# Patient Record
Sex: Female | Born: 1980 | Race: Black or African American | Hispanic: No | Marital: Single | Smoking: Never smoker
Health system: Southern US, Community
[De-identification: ages and names within clinical notes are randomized; demographics above are authoritative.]

## PROBLEM LIST (undated history)

## (undated) ENCOUNTER — Inpatient Hospital Stay (HOSPITAL_COMMUNITY): Payer: Self-pay

## (undated) DIAGNOSIS — F32A Depression, unspecified: Secondary | ICD-10-CM

## (undated) DIAGNOSIS — F329 Major depressive disorder, single episode, unspecified: Secondary | ICD-10-CM

## (undated) DIAGNOSIS — F445 Conversion disorder with seizures or convulsions: Secondary | ICD-10-CM

## (undated) DIAGNOSIS — F4310 Post-traumatic stress disorder, unspecified: Secondary | ICD-10-CM

## (undated) DIAGNOSIS — F319 Bipolar disorder, unspecified: Secondary | ICD-10-CM

## (undated) DIAGNOSIS — O139 Gestational [pregnancy-induced] hypertension without significant proteinuria, unspecified trimester: Secondary | ICD-10-CM

## (undated) DIAGNOSIS — S99921A Unspecified injury of right foot, initial encounter: Secondary | ICD-10-CM

## (undated) DIAGNOSIS — F419 Anxiety disorder, unspecified: Secondary | ICD-10-CM

## (undated) DIAGNOSIS — F99 Mental disorder, not otherwise specified: Secondary | ICD-10-CM

## (undated) DIAGNOSIS — I10 Essential (primary) hypertension: Secondary | ICD-10-CM

## (undated) DIAGNOSIS — C50919 Malignant neoplasm of unspecified site of unspecified female breast: Secondary | ICD-10-CM

## (undated) HISTORY — DX: Malignant neoplasm of unspecified site of unspecified female breast: C50.919

## (undated) HISTORY — DX: Conversion disorder with seizures or convulsions: F44.5

## (undated) HISTORY — DX: Gestational (pregnancy-induced) hypertension without significant proteinuria, unspecified trimester: O13.9

## (undated) HISTORY — DX: Post-traumatic stress disorder, unspecified: F43.10

## (undated) HISTORY — PX: FOOT SURGERY: SHX648

## (undated) MED FILL — Ferumoxytol Inj 510 MG/17ML (30 MG/ML) (Elemental Fe): INTRAVENOUS | Qty: 17 | Status: AC

---

## 2006-05-14 ENCOUNTER — Inpatient Hospital Stay (HOSPITAL_COMMUNITY): Admission: AD | Admit: 2006-05-14 | Discharge: 2006-05-21 | Payer: Self-pay | Admitting: *Deleted

## 2006-05-14 ENCOUNTER — Emergency Department (HOSPITAL_COMMUNITY): Admission: EM | Admit: 2006-05-14 | Discharge: 2006-05-14 | Payer: Self-pay | Admitting: Emergency Medicine

## 2006-05-14 ENCOUNTER — Ambulatory Visit: Payer: Self-pay | Admitting: *Deleted

## 2006-12-12 ENCOUNTER — Encounter: Admission: RE | Admit: 2006-12-12 | Discharge: 2006-12-12 | Payer: Self-pay | Admitting: Family Medicine
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
## 2012-10-09 ENCOUNTER — Encounter (HOSPITAL_COMMUNITY): Payer: Self-pay | Admitting: *Deleted

## 2012-10-09 ENCOUNTER — Inpatient Hospital Stay (HOSPITAL_COMMUNITY)
Admission: RE | Admit: 2012-10-09 | Discharge: 2012-10-14 | DRG: 885 | Disposition: A | Discharge status: Home / Self Care | Payer: BC Managed Care – PPO | Attending: Psychiatry | Admitting: Psychiatry

## 2012-10-09 DIAGNOSIS — Z79899 Other long term (current) drug therapy: Secondary | ICD-10-CM

## 2012-10-09 DIAGNOSIS — F314 Bipolar disorder, current episode depressed, severe, without psychotic features: Principal | ICD-10-CM | POA: Diagnosis present

## 2012-10-09 DIAGNOSIS — R4589 Other symptoms and signs involving emotional state: Secondary | ICD-10-CM | POA: Diagnosis present

## 2012-10-09 DIAGNOSIS — S99921A Unspecified injury of right foot, initial encounter: Secondary | ICD-10-CM

## 2012-10-09 DIAGNOSIS — F411 Generalized anxiety disorder: Secondary | ICD-10-CM | POA: Diagnosis present

## 2012-10-09 DIAGNOSIS — R45851 Suicidal ideations: Secondary | ICD-10-CM

## 2012-10-09 HISTORY — DX: Anxiety disorder, unspecified: F41.9

## 2012-10-09 HISTORY — DX: Unspecified injury of right foot, initial encounter: S99.921A

## 2012-10-09 HISTORY — DX: Mental disorder, not otherwise specified: F99

## 2012-10-09 HISTORY — PX: LAPAROSCOPIC GASTRIC SLEEVE RESECTION: SHX5895

## 2012-10-09 HISTORY — DX: Major depressive disorder, single episode, unspecified: F32.9

## 2012-10-09 HISTORY — DX: Depression, unspecified: F32.A

## 2012-10-09 MED ORDER — MAGNESIUM HYDROXIDE 400 MG/5ML PO SUSP
30.0000 mL | Freq: Every day | ORAL | Status: DC | PRN
  Administered 2012-10-10: 30 mL via ORAL

## 2012-10-09 MED ORDER — ALUM & MAG HYDROXIDE-SIMETH 200-200-20 MG/5ML PO SUSP: 30.0000 mL | ORAL | Status: DC | PRN

## 2012-10-09 MED ORDER — QUETIAPINE FUMARATE ER 400 MG PO TB24
400.0000 mg | ORAL_TABLET | Freq: Every day | ORAL | Status: DC
  Administered 2012-10-09 – 2012-10-11 (×3): 400 mg via ORAL
  Filled 2012-10-09 (×6): qty 1

## 2012-10-09 MED ORDER — LORAZEPAM 1 MG PO TABS
2.0000 mg | ORAL_TABLET | Freq: Every day | ORAL | Status: DC
  Administered 2012-10-09 – 2012-10-13 (×5): 2 mg via ORAL
  Filled 2012-10-09 (×5): qty 2

## 2012-10-09 MED ORDER — ACETAMINOPHEN 325 MG PO TABS: 650.0000 mg | ORAL_TABLET | Freq: Four times a day (QID) | ORAL | Status: DC | PRN

## 2012-10-09 MED ORDER — LAMOTRIGINE 100 MG PO TABS
200.0000 mg | ORAL_TABLET | Freq: Every day | ORAL | Status: DC
  Administered 2012-10-09 – 2012-10-13 (×5): 200 mg via ORAL
  Filled 2012-10-09 (×4): qty 1
  Filled 2012-10-09: qty 2
  Filled 2012-10-09 (×2): qty 1
  Filled 2012-10-09: qty 6

## 2012-10-09 NOTE — Progress Notes (Signed)
32 year old female pt admitted voluntarily as a walk-in. On admission, pt reports increased depression and having passive SI. Pt states a stressor being a cousin who passed recently and another cousin who is not supportive of her and also feels her medications are no longer working. Pt does see Dr. Nolen Mu on the outside. Pt spoke about having obsessive thoughts of death recently. Pt is able to contract for safety on the unit. Pt was oriented to the unit and safety maintained.

## 2012-10-09 NOTE — Progress Notes (Signed)
Writer spoke with patient and she was informed of her scheduled medications which she received. Patient spent a little time in the dayroom watching tv and interacting with select peers. Patient requested to use the phone to call her mother which she did. Patient inquired if her mother could bring in her protein powder from home and writer suggested that she speak with her doctor or P.A. On tomorrow. Patient reports feeling bad for having to be admitted and reports that she could not wait until her appointment on Wednesday because of the way she was feeling. Patient feels that her medications are not effective. Support and encouragement offered, safety maintained with 15 min checks, will continue to monitor.

## 2012-10-09 NOTE — Tx Team (Signed)
Initial Interdisciplinary Treatment Plan  PATIENT STRENGTHS: (choose at least two) Ability for insight Average or above average intelligence Capable of independent living General fund of knowledge Supportive family/friends  PATIENT STRESSORS: Loss of cousin   PROBLEM LIST: Problem List/Patient Goals Date to be addressed Date deferred Reason deferred Estimated date of resolution  Depression 10/09/12     Suicidal Ideation 10/09/12                                                DISCHARGE CRITERIA:  Ability to meet basic life and health needs Improved stabilization in mood, thinking, and/or behavior Verbal commitment to aftercare and medication compliance  PRELIMINARY DISCHARGE PLAN: Attend aftercare/continuing care group Return to previous living arrangement  PATIENT/FAMIILY INVOLVEMENT: This treatment plan has been presented to and reviewed with the patient, Casey Lang, and/or family member, .  The patient and family have been given the opportunity to ask questions and make suggestions.  Tristin Vandeusen, [place] 10/09/2012, 9:12 PM

## 2012-10-09 NOTE — Progress Notes (Signed)
Adult Psychoeducational Group Note Date: 10/09/2012  Time: 9:11 PM  Group Topic/Focus:  Wrap-Up Group: The focus of this group is to help patients review their daily goal of treatment and discuss progress on daily workbooks.  Participation Level: Active  Participation Quality: Appropriate, Sharing and Supportive  Affect: Appropriate  Cognitive: Appropriate  Insight: Appropriate  Engagement in Group: Engaged and Supportive  Modes of Intervention: Discussion, Education and Support  Additional Comments: pt attend group, pt is working on getting on the right medication , working on coping with SI thoughts and depression.  Casey Lang M  10/09/2012, 9:11 PM
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
## 2012-10-10 ENCOUNTER — Encounter (HOSPITAL_COMMUNITY): Payer: Self-pay | Admitting: Psychiatry

## 2012-10-10 LAB — URINALYSIS, ROUTINE W REFLEX MICROSCOPIC
Bilirubin Urine: NEGATIVE
Hgb urine dipstick: NEGATIVE
Ketones, ur: NEGATIVE mg/dL
Leukocytes, UA: NEGATIVE
Nitrite: NEGATIVE
Protein, ur: NEGATIVE mg/dL
Specific Gravity, Urine: 1.029 (ref 1.005–1.030)
pH: 5.5 (ref 5.0–8.0)

## 2012-10-10 LAB — COMPREHENSIVE METABOLIC PANEL
ALT: 9 U/L (ref 0–35)
Albumin: 3.6 g/dL (ref 3.5–5.2)
Alkaline Phosphatase: 49 U/L (ref 39–117)
CO2: 26 mEq/L (ref 19–32)
Calcium: 9.1 mg/dL (ref 8.4–10.5)
Creatinine, Ser: 1.02 mg/dL (ref 0.50–1.10)
GFR calc Af Amer: 83 mL/min — ABNORMAL LOW (ref >90)

## 2012-10-10 LAB — CBC
HCT: 37.3 % (ref 36.0–46.0)
MCH: 29.9 pg (ref 26.0–34.0)
RBC: 4.18 MIL/uL (ref 3.87–5.11)
RDW: 14.6 % (ref 11.5–15.5)

## 2012-10-10 LAB — ETHANOL: Alcohol, Ethyl (B): <11 mg/dL (ref 0–11)

## 2012-10-10 LAB — TSH: TSH: 4.083 u[IU]/mL (ref 0.350–4.500)

## 2012-10-10 MED ORDER — DULOXETINE HCL 20 MG PO CPEP
20.0000 mg | ORAL_CAPSULE | Freq: Every day | ORAL | Status: DC
  Administered 2012-10-10 – 2012-10-14 (×5): 20 mg via ORAL
  Filled 2012-10-10 (×6): qty 1
  Filled 2012-10-10: qty 3

## 2012-10-10 NOTE — H&P (Signed)
  Pt was seen by me today and I agree with the key elements documented in H&P.  

## 2012-10-10 NOTE — Progress Notes (Signed)
Writer observed patient sitting in the dayroom watching tv with minimal interaction with peers. Patient received her scheduled 2000 meds and reports that her day has been much better and her father visited her on today. Patient reports that her goal is to be functional with her medication so she can return to work. Patient voiced no complaints, support and encouragement offered, safety maintained on unit with 15 min checks, will continue to monitor.
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
## 2012-10-10 NOTE — Progress Notes (Signed)
Patient ID: Casey Lang, female   DOB: 02/27/1980, 32 y.o.   MRN: [id number] Psychoeducational Group Note  Date:  10/10/2012 Time:1100am  Group Topic/Focus:  Identifying Needs:   The focus of this group is to help patients identify their personal needs that have been historically problematic and identify healthy behaviors to address their needs.  Participation Level:  Active  Participation Quality:  Appropriate  Affect:  Appropriate  Cognitive:  Appropriate  Insight:  Supportive  Engagement in Group:  Supportive  Additional Comments:  Psychoeducational group -life skills   Valente David 10/10/2012,12:19 PM
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
## 2012-10-10 NOTE — [facility] Group Notes (Signed)
[facility] Group Notes:  (Clinical Social Work)  10/10/2012   3:00-4:00PM  Summary of Progress/Problems:   The main focus of today's process group was for the patient to identify ways in which they have sabotaged their own mental health wellness/recovery.  Motivational interviewing was used to explore the reasons they engage in this behavior, and reasons they may have for wanting to change.  The Stages of Change were explained to the group using a handout, and patients identified where they are with regard to changing self-defeating behaviors.  The patient expressed that she engages in negative self talk "all the time", and that she ultimately does this for self protection, hoping to say she is fat before her cousins can say it, for example.  She feels she is in Action stage, is starting on a new medication here at [facility], has plans to return to her old therapist.  She stopped going to therapy because she thought she didn't need it any longer, but she now recognizes that this may be a necessary ongoing support.  Type of Therapy:  Process Group  Participation Level:  Active  Participation Quality:  Attentive, Sharing and Supportive  Affect:  Appropriate  Cognitive:  Alert and Appropriate  Insight:  Engaged  Engagement in Therapy:  Engaged  Modes of Intervention:  Education, Motivational Interviewing   Ambrose Mantle, LCSW 10/10/2012, 4:45 PM
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
## 2012-10-10 NOTE — Progress Notes (Signed)
Adult Psychoeducational Group Note  Date:  10/10/2012 Time: 1:15pm Group Topic/Focus:  Therapeutic Activity  Participation Level:  Active  Participation Quality:  Appropriate and Attentive  Affect:  Appropriate  Cognitive:  Appropriate  Insight: Appropriate  Engagement in Group:  Engaged  Modes of Intervention:  Discussion and Education  Additional Comments:  Pt attended and participated in group  Shelly Bombard D 10/10/2012, 2:17 PM

## 2012-10-10 NOTE — Progress Notes (Signed)
D) Pt has attended the groups and interacts with her peers. Rates her depression at a 9 and her hopelessness at a 2. States that she has had thoughts of SI on and off today. Affect is flat and mood depressed.  A) Given support and reassurance along with praise. Encouraged to work on her workbook today.  R) Contracts for her safety on the unit.

## 2012-10-11 DIAGNOSIS — F411 Generalized anxiety disorder: Secondary | ICD-10-CM

## 2012-10-11 DIAGNOSIS — F3130 Bipolar disorder, current episode depressed, mild or moderate severity, unspecified: Secondary | ICD-10-CM

## 2012-10-11 NOTE — [facility] Group Notes (Signed)
Adult Psychoeducational Group Note  Date:  10/11/2012 Time:  9:50 PM  Group Topic/Focus:  Wrap-Up Group:   The focus of this group is to help patients review their daily goal of treatment and discuss progress on daily workbooks.  Participation Level:  Minimal  Participation Quality:  Appropriate  Affect:  Appropriate  Cognitive:  Appropriate  Insight: Appropriate  Engagement in Group:  Limited  Modes of Intervention:  Discussion  Additional Comments:  Casey Lang stated her goal was to socialize more and go to and eat all her meals.  She expressed that she was able to accomplish her goal.  Casey Lang 10/11/2012, 9:50 PM

## 2012-10-11 NOTE — Progress Notes (Signed)
The focus of this group is to help patients review their daily goal of treatment and discuss progress on daily workbooks. Pt attended the evening group session and responded to discussion prompts from the Writer. Pt reported having a good day on the unit largely because "I finally feel like myself again." Pt attributed this good feeling to her medications. Pt's affect was pleasant and she volunteered several encouraging comments to her peers when they spoke.

## 2012-10-11 NOTE — Progress Notes (Signed)
[facility] MD Progress Note  10/11/2012 11:25 AM Casey Lang  MRN:  [id number] Subjective:  Patient stated "I feel a whole lot better, feel like my old self.  I feel emotions again."  She is not expressing or exhibiting any signs and symptoms of hypomania or mania from the Cymbalta but likes how it is making her feel, denies depression, anxiety, and suicidal/homicidal ideations and hallucinations.  She is smiling on assessment, engages easily, goal directed thoughts.  Family visited last night and it went well. Diagnosis:   DSM5:  Depressive Disorders:  Major Depressive Disorder - Severe (296.23)  Axis I: Anxiety Disorder NOS and Bipolar, Depressed Axis II: Deferred Axis III:  Past Medical History  Diagnosis Date  . Right foot injury 10/09/2012    Resolved without complication  . Mental disorder   . Depression   . Anxiety    Axis IV: other psychosocial or environmental problems, problems related to social environment and problems with primary support group Axis V: 41-50 serious symptoms  ADL's:  Intact  Sleep: Good  Appetite:  Good  Suicidal Ideation:  Denies Homicidal Ideation:  Denies  Psychiatric Specialty Exam: Review of Systems  Constitutional: Negative.   HENT: Negative.   Eyes: Negative.   Respiratory: Negative.   Cardiovascular: Negative.   Gastrointestinal: Negative.   Genitourinary: Negative.   Musculoskeletal: Negative.   Skin: Negative.   Neurological: Negative.   Endo/Heme/Allergies: Negative.   Psychiatric/Behavioral: The patient is nervous/anxious.     Blood pressure 108/78, pulse 124, temperature 98.2 F (36.8 C), temperature source Oral, resp. rate 18, height 5\' 8"  (1.727 m), weight 110.678 kg (244 lb).Body mass index is 37.11 kg/(m^2).  General Appearance: Casual  Eye Contact::  Fair  Speech:  Normal Rate  Volume:  Normal  Mood:  Anxious  Affect:  Congruent  Thought Process:  Coherent  Orientation:  Full (Time, Place, and Person)  Thought  Content:  WDL  Suicidal Thoughts:  No  Homicidal Thoughts:  No  Memory:  Immediate;   Fair Recent;   Fair Remote;   Fair  Judgement:  Fair  Insight:  Fair  Psychomotor Activity:  Normal  Concentration:  Fair  Recall:  Fair  Akathisia:  No  Handed:  Right  AIMS (if indicated):     Assets:  Communication Skills Resilience Social Support  Sleep:  Number of Hours: 5.25   Current Medications: Current Facility-Administered Medications  Medication Dose Route Frequency Provider Last Rate Last Dose  . acetaminophen (TYLENOL) tablet 650 mg  650 mg Oral Q6H PRN Court Joy, PA-C      . alum & mag hydroxide-simeth (MAALOX/MYLANTA) 200-200-20 MG/5ML suspension 30 mL  30 mL Oral Q4H PRN Court Joy, PA-C      . DULoxetine (CYMBALTA) DR capsule 20 mg  20 mg Oral Daily Nanine Means, NP   20 mg at 10/11/12 0818  . lamoTRIgine (LAMICTAL) tablet 200 mg  200 mg Oral QHS Court Joy, PA-C   200 mg at 10/10/12 4098  . LORazepam (ATIVAN) tablet 2 mg  2 mg Oral QHS Court Joy, PA-C   2 mg at 10/10/12 1191  . magnesium hydroxide (MILK OF MAGNESIA) suspension 30 mL  30 mL Oral Daily PRN Court Joy, PA-C   30 mL at 10/10/12 1822  . QUEtiapine (SEROQUEL XR) 24 hr tablet 400 mg  400 mg Oral QHS Court Joy, PA-C   400 mg at 10/10/12 4782    Lab Results:  Results for  orders placed during the hospital encounter of 10/09/12 (from the past 48 hour(s))  CBC     Status: None   Collection Time    10/10/12  6:30 AM      Result Value Range   WBC 4.0  4.0 - 10.5 K/uL   RBC 4.18  3.87 - 5.11 MIL/uL   Hemoglobin 12.5  12.0 - 15.0 g/dL   HCT 16.1  09.6 - 04.5 %   MCV 89.2  78.0 - 100.0 fL   MCH 29.9  26.0 - 34.0 pg   MCHC 33.5  30.0 - 36.0 g/dL   RDW 40.9  81.1 - 91.4 %   Platelets 240  150 - 400 K/uL   Comment: Performed at [facility]  COMPREHENSIVE METABOLIC PANEL     Status: Abnormal   Collection Time    10/10/12  6:30 AM      Result Value Range   Sodium 136   135 - 145 mEq/L   Potassium 3.8  3.5 - 5.1 mEq/L   Chloride 103  96 - 112 mEq/L   CO2 26  19 - 32 mEq/L   Glucose, Bld 105 (*) 70 - 99 mg/dL   BUN 20  6 - 23 mg/dL   Creatinine, Ser 7.82  0.50 - 1.10 mg/dL   Calcium 9.1  8.4 - 95.6 mg/dL   Total Protein 6.9  6.0 - 8.3 g/dL   Albumin 3.6  3.5 - 5.2 g/dL   AST 12  0 - 37 U/L   ALT 9  0 - 35 U/L   Alkaline Phosphatase 49  39 - 117 U/L   Total Bilirubin 0.6  0.3 - 1.2 mg/dL   GFR calc non Af Amer 71 (*) >90 mL/min   GFR calc Af Amer 83 (*) >90 mL/min   Comment: (NOTE)     The eGFR has been calculated using the CKD EPI equation.     This calculation has not been validated in all clinical situations.     eGFR's persistently <90 mL/min signify possible Chronic Kidney     Disease.     Performed at [facility]  ETHANOL     Status: None   Collection Time    10/10/12  6:30 AM      Result Value Range   Alcohol, Ethyl (B) <11  0 - 11 mg/dL   Comment:            LOWEST DETECTABLE LIMIT FOR     SERUM ALCOHOL IS 11 mg/dL     FOR MEDICAL PURPOSES ONLY     Performed at [facility]  TSH     Status: None   Collection Time    10/10/12  6:30 AM      Result Value Range   TSH 4.083  0.350 - 4.500 uIU/mL   Comment: Performed at [facility]  URINALYSIS, ROUTINE W REFLEX MICROSCOPIC     Status: Abnormal   Collection Time    10/10/12 11:50 AM      Result Value Range   Color, Urine YELLOW  YELLOW   APPearance CLOUDY (*) CLEAR   Specific Gravity, Urine 1.029  1.005 - 1.030   pH 5.5  5.0 - 8.0   Glucose, UA NEGATIVE  NEGATIVE mg/dL   Hgb urine dipstick NEGATIVE  NEGATIVE   Bilirubin Urine NEGATIVE  NEGATIVE   Ketones, ur NEGATIVE  NEGATIVE mg/dL   Protein, ur NEGATIVE  NEGATIVE mg/dL  Urobilinogen, UA 0.2  0.0 - 1.0 mg/dL   Nitrite NEGATIVE  NEGATIVE   Leukocytes, UA NEGATIVE  NEGATIVE   Comment: MICROSCOPIC NOT DONE ON URINES WITH NEGATIVE PROTEIN, BLOOD, LEUKOCYTES, NITRITE, OR GLUCOSE <1000  mg/dL.     Performed at [facility]  URINE RAPID DRUG SCREEN (HOSP PERFORMED)     Status: None   Collection Time    10/10/12 11:50 AM      Result Value Range   Opiates NONE DETECTED  NONE DETECTED   Cocaine NONE DETECTED  NONE DETECTED   Benzodiazepines NONE DETECTED  NONE DETECTED   Amphetamines NONE DETECTED  NONE DETECTED   Tetrahydrocannabinol NONE DETECTED  NONE DETECTED   Barbiturates NONE DETECTED  NONE DETECTED   Comment:            DRUG SCREEN FOR MEDICAL PURPOSES     ONLY.  IF CONFIRMATION IS NEEDED     FOR ANY PURPOSE, NOTIFY LAB     WITHIN 5 DAYS.                LOWEST DETECTABLE LIMITS     FOR URINE DRUG SCREEN     Drug Class       Cutoff (ng/mL)     Amphetamine      1000     Barbiturate      200     Benzodiazepine   200     Tricyclics       300     Opiates          300     Cocaine          300     THC              50     Performed at [facility]    Physical Findings: AIMS: Facial and Oral Movements Muscles of Facial Expression: None, normal Lips and Perioral Area: None, normal Jaw: None, normal Tongue: None, normal,Extremity Movements Upper (arms, wrists, hands, fingers): None, normal Lower (legs, knees, ankles, toes): None, normal, Trunk Movements Neck, shoulders, hips: None, normal, Overall Severity Severity of abnormal movements (highest score from questions above): None, normal Incapacitation due to abnormal movements: None, normal Patient's awareness of abnormal movements (rate only patient's report): No Awareness, Dental Status Current problems with teeth and/or dentures?: No Does patient usually wear dentures?: No  CIWA:    COWS:     Treatment Plan Summary: Daily contact with patient to assess and evaluate symptoms and progress in treatment Medication management  Plan:  Review of chart, vital signs, medications, and notes. 1-Individual and group therapy 2-Medication management for depression and anxiety:   Medications reviewed with the patient and no untoward effects, no changes made 3-Coping skills for depression and anxiety 4-Continue crisis stabilization and management 5-Address health issues--monitoring vital signs, stable 6-Treatment plan in progress to prevent relapse of depression and anxiety  Medical Decision Making Problem Points:  Established problem, stable/improving (1) and Review of psycho-social stressors (1) Data Points:  Review of medication regiment & side effects (2)  I certify that inpatient services furnished can reasonably be expected to improve the patient's condition.   Nanine Means, PMH-NP 10/11/2012, 11:25 AM

## 2012-10-11 NOTE — Progress Notes (Signed)
Patient ID: Casey Lang, female   DOB: 1980-08-03, 32 y.o.   MRN: [id number] Psychoeducational Group Note  Date:  10/11/2012 Time:  1045am  Group Topic/Focus:  Making Healthy Choices:   The focus of this group is to help patients identify negative/unhealthy choices they were using prior to admission and identify positive/healthier coping strategies to replace them upon discharge.  Participation Level:  Active  Participation Quality:  Appropriate  Affect:  Appropriate  Cognitive:  Appropriate  Insight:  Supportive  Engagement in Group:  Supportive  Additional Comments:  Psychoeducational group-life skills   Valente David 10/11/2012,1:27 PM

## 2012-10-11 NOTE — Progress Notes (Signed)
Patient ID: Casey Lang, female   DOB: September 29, 1980, 32 y.o.   MRN: [id number] Psychoeducational Group Note  Date:  10/11/2012 Time:  0930am  Group Topic/Focus:  Making Healthy Choices:   The focus of this group is to help patients identify negative/unhealthy choices they were using prior to admission and identify positive/healthier coping strategies to replace them upon discharge.  Participation Level:  Active  Participation Quality:  Appropriate  Affect:  Appropriate  Cognitive:  Appropriate  Insight:  Resistant  Engagement in Group:  Resistant  Additional Comments:  Psychoeducational group-greatfulness  Valente David 10/11/2012,10:19 AM

## 2012-10-11 NOTE — [facility] Group Notes (Signed)
[facility] Group Notes:  (Clinical Social Work)  10/11/2012   3:00-4:00PM  Summary of Progress/Problems:   The main focus of today's process group was to   identify the patient's current support system and decide on other supports that can be put in place.  The picture on workbook was used to discuss why additional supports are needed, and a hand-out was distributed with four definitions/levels of support, then used to talk about how patients have given and received all different kinds of support.  An emphasis was placed on using counselor, doctor, therapy groups, 12-step groups, and problem-specific support groups to expand supports.  The patient identified one additional support as being a kayaking club she is familiar with and has been to before.  She knows that she tends to isolate to her home, and states she needs to do more things that keep her with people and outside of the home.  Type of Therapy:  Process Group  Participation Level:  Active  Participation Quality:  Attentive, Sharing and Supportive  Affect:  Appropriate  Cognitive:  Appropriate and Oriented  Insight:  Engaged  Engagement in Therapy:  Engaged  Modes of Intervention:  Education,  Support and [employer], LCSW 10/11/2012, 4:54 PM

## 2012-10-11 NOTE — Progress Notes (Signed)
D) Pt states that she is feeling much better and feels as though the Cymbalta is working well. Talked with Pt about making a pact with her parents to help her identify when she is starting to show symptoms of mania or depression. States that she feels that she can do that and that her family is supportive and will help her with this. Attending the groups, partisipating and interacting with her peers appropriately. Shared with this writer that she has probl;ems being in large crowds and does not want to go to the cafeteria because "it is too uncomfortable" A) Given support and reassurance along with praise. Encouraged to talk with her parents and to write out a plan. Provided with a 1:1 and encouraged to go to the cafeteria, even though it feels uncomfortable. R) Pt denies SI and HI. Rates her depression and hopelessness both at a 1. Was able to go to the cafeteria and eat her meal.

## 2012-10-11 NOTE — Progress Notes (Signed)
Writer spoke with patient in her room and observed her lying in bed staring at the ceiling. Writer inquired if she felt ok and she reports that she feels a bit manic. Writer asked if patient was interested in doing word searches and she was agreeable. Patient has been up and in the dayroom earlier and attended group this evening. Patient requested that her medications be given at 2100 instead of 2000 b/c she did not rest all through the night. Support and encouragement offered, safety maintained on unit with 15 min checks. Patient denies si/hi/a/v hallucinations. Will continue to monitor.

## 2012-10-12 MED ORDER — QUETIAPINE FUMARATE ER 400 MG PO TB24
400.0000 mg | ORAL_TABLET | Freq: Every day | ORAL | Status: DC
  Administered 2012-10-12 – 2012-10-13 (×2): 400 mg via ORAL
  Filled 2012-10-12: qty 1
  Filled 2012-10-12: qty 3
  Filled 2012-10-12 (×2): qty 1

## 2012-10-12 NOTE — Progress Notes (Addendum)
Patient would like her protein shake which she has in the 500 med room.  MD/PA informed.  D:  Patient's self inventory sheet, has poor sleep, poor appetite, low energy level, poor attention span.  Rated depression and hopelessness #1.  Denied withdrawals.  Denied SI.  Has felt lethargic.  "Continue outpatient treatment with psychiatrist and therapist.  Join/attend support groups r/t mental health, socializing activities.  Did not sleep well.  Will wake up around 1 a.m., drift on/off, and then wide awake until 5 a.m.  At 6 a.m. Very exhausted, then do morning routine, still groggy.  Maybe seroquel XR can be increased so I can sleep better.  Also feeling manic with cymbalta and having visual hallucinations. But it works wonders for my depression."   No problems taking meds after discharge. A:  Medications administered per MD orders.  Emotional support and encouragement given patient throughout day. R:  Denied SI and HI.   Denied voices.  Has been seeing eyes on the ceiling of her room and in her breakfast tray this morning.  Denied pain.  Stated she needs her medications adjusted.  1800  Patient stated she has hallucinations previously on seroquel XR 800 mg, approximately 16 months ago.  "It took awhile for them to go away.  But do not want this to keep me from being discharged."  Has follow up appointment with Dr. Emerson Monte on [address], [place], [place] on Wednesday 10/14/2012.  Has appointment with her every 3 months.   Dr. Nolen Mu is aware of patient's symptoms.  Areta Haber, clinical nurse specialist  in psychiatry,  is aware that patient is at [facility].

## 2012-10-12 NOTE — Progress Notes (Signed)
Patient ID: Casey Lang, female   DOB: [birth year], 32 y.o.   MRN: [id number] [facility] MD Progress Note  10/12/2012 2:04 PM Casey Lang  MRN:  [id number]  Subjective:  Patient stated that she needs a physician order for taking her protein shake from home. She has requested to change the seroquel to different hour in the evening to prevent having day time drowsiness. She has been adjusting to her current medications without significant side effects. She wants to watch out for manic episode on her antidepressant medication Cymbalta. She stated that she gets obsessive thoughts event though she does not have OCD as diagnosis.   Diagnosis:   DSM5:  Depressive Disorders:  Major Depressive Disorder - Severe (296.23), Bipolar disorder  Axis I: Anxiety Disorder NOS and Bipolar, Depressed Axis II: Deferred Axis III:  Past Medical History  Diagnosis Date  . Right foot injury 10/09/2012    Resolved without complication  . Mental disorder   . Depression   . Anxiety    Axis IV: other psychosocial or environmental problems, problems related to social environment and problems with primary support group Axis V: 41-50 serious symptoms  ADL's:  Intact  Sleep: Good  Appetite:  Good  Suicidal Ideation:  Denies Homicidal Ideation:  Denies  Psychiatric Specialty Exam: Review of Systems  Constitutional: Negative.   HENT: Negative.   Eyes: Negative.   Respiratory: Negative.   Cardiovascular: Negative.   Gastrointestinal: Negative.   Genitourinary: Negative.   Musculoskeletal: Negative.   Skin: Negative.   Neurological: Negative.   Endo/Heme/Allergies: Negative.   Psychiatric/Behavioral: The patient is nervous/anxious.     Blood pressure 129/94, pulse 84, temperature 98.1 F (36.7 C), temperature source Oral, resp. rate 18, height 5\' 8"  (1.727 m), weight 110.678 kg (244 lb).Body mass index is 37.11 kg/(m^2).  General Appearance: Casual  Eye Contact::  Fair  Speech:  Normal Rate   Volume:  Normal  Mood:  Anxious  Affect:  Congruent  Thought Process:  Coherent  Orientation:  Full (Time, Place, and Person)  Thought Content:  WDL  Suicidal Thoughts:  No  Homicidal Thoughts:  No  Memory:  Immediate;   Fair Recent;   Fair Remote;   Fair  Judgement:  Fair  Insight:  Fair  Psychomotor Activity:  Normal  Concentration:  Fair  Recall:  Fair  Akathisia:  No  Handed:  Right  AIMS (if indicated):     Assets:  Communication Skills Resilience Social Support  Sleep:  Number of Hours: 6   Current Medications: Current Facility-Administered Medications  Medication Dose Route Frequency Provider Last Rate Last Dose  . acetaminophen (TYLENOL) tablet 650 mg  650 mg Oral Q6H PRN Court Joy, PA-C      . alum & mag hydroxide-simeth (MAALOX/MYLANTA) 200-200-20 MG/5ML suspension 30 mL  30 mL Oral Q4H PRN Court Joy, PA-C      . DULoxetine (CYMBALTA) DR capsule 20 mg  20 mg Oral Daily Nanine Means, NP   20 mg at 10/12/12 0756  . lamoTRIgine (LAMICTAL) tablet 200 mg  200 mg Oral QHS Court Joy, PA-C   200 mg at 10/11/12 2110  . LORazepam (ATIVAN) tablet 2 mg  2 mg Oral QHS Court Joy, PA-C   2 mg at 10/11/12 2109  . magnesium hydroxide (MILK OF MAGNESIA) suspension 30 mL  30 mL Oral Daily PRN Court Joy, PA-C   30 mL at 10/10/12 1822  . QUEtiapine (SEROQUEL XR) 24 hr tablet 400 mg  400 mg Oral QHS Court Joy, PA-C   400 mg at 10/11/12 2109    Lab Results:  No results found for this or any previous visit (from the past 48 hour(s)).  Physical Findings: AIMS: Facial and Oral Movements Muscles of Facial Expression: None, normal Lips and Perioral Area: None, normal Jaw: None, normal Tongue: None, normal,Extremity Movements Upper (arms, wrists, hands, fingers): None, normal Lower (legs, knees, ankles, toes): None, normal, Trunk Movements Neck, shoulders, hips: None, normal, Overall Severity Severity of abnormal movements (highest score from  questions above): None, normal Incapacitation due to abnormal movements: None, normal Patient's awareness of abnormal movements (rate only patient's report): No Awareness, Dental Status Current problems with teeth and/or dentures?: No Does patient usually wear dentures?: No  CIWA:  CIWA-Ar Total: 3 COWS:  COWS Total Score: 3  Treatment Plan Summary: Daily contact with patient to assess and evaluate symptoms and progress in treatment Medication management  Plan:  Review of chart, vital signs, medications, and notes. 1-Individual and group therapy 2-Medication management for depression and anxiety:  Medications reviewed with the patient and no untoward effects, change the time of seroquel for better control of drowsiness. May take protein diet from home supply. 3-Coping skills for depression and anxiety 4-Continue crisis stabilization and management 5-Address health issues--monitoring vital signs, stable 6-Treatment plan in progress to prevent relapse of depression and anxiety  Medical Decision Making Problem Points:  Established problem, stable/improving (1) and Review of psycho-social stressors (1) Data Points:  Review of medication regiment & side effects (2)  I certify that inpatient services furnished can reasonably be expected to improve the patient's condition.   Nehemiah Settle., MD  10/12/2012, 2:04 PM

## 2012-10-12 NOTE — Progress Notes (Signed)
Patient ID: Casey Lang, female   DOB: 06/25/1980, 32 y.o.   MRN: [id number] D: pt. Reports she was admitted because she was "depressed andhaving obsessive thoughts of death, but the Cymbalta is helping and the Seroquel" "groups been good, I learned a lot about my illness, more than ever." A: Writer introduced self to client and discussed medication and administration times. Writer encouraged group. Staff will monitor q17min for safety. R: Pt. Is safe on the unit and attends group.

## 2012-10-12 NOTE — Progress Notes (Signed)
Adult Psychoeducational Group Note  Date:  10/12/2012 Time:  10:00am Group Topic/Focus:  Self Care:   The focus of this group is to help patients understand the importance of self-care in order to improve or restore emotional, physical, spiritual, interpersonal, and financial health.  Participation Level:  Active  Participation Quality:  Appropriate and Attentive  Affect:  Appropriate  Cognitive:  Alert and Appropriate  Insight: Appropriate  Engagement in Group:  Engaged  Modes of Intervention:  Discussion and Education  Additional Comments:  Pt attended and participated in group.When asked what self care means to her pt stated taking care of yourself,eating right, exercising, getting plenty of rest.  Shelly Bombard D 10/12/2012, 1:03 PM

## 2012-10-13 NOTE — Progress Notes (Signed)
Patient ID: Casey Lang, female   DOB: 1980/04/13, 32 y.o.   MRN: [id number] [facility] MD Progress Note  10/13/2012 1:50 PM Casey Lang  MRN:  [id number]  Subjective:  Patient is seen today and she stated that she feels much better today because she took her Seroquel at 6 PM last night. She also taking her protein shake from home. She has less day time drowsiness. She has minimizes symptoms of depression, anxiety and mania and stated sleeping and appetite has improved. She has been actively participating on the unit activities and coping with adjusting current medications without significant side effects and learning coping skills during counseling sessions. She has no apparent manic episode on Cymbalta. She stated that she gets obsessive thoughts from time to time but does not provide details at this time.  Diagnosis:   DSM5:  Depressive Disorders:  Major Depressive Disorder - Severe (296.23), Bipolar disorder  Axis I: Anxiety Disorder NOS and Bipolar, Depressed Axis II: Deferred Axis III:  Past Medical History  Diagnosis Date  . Right foot injury 10/09/2012    Resolved without complication  . Mental disorder   . Depression   . Anxiety    Axis IV: other psychosocial or environmental problems, problems related to social environment and problems with primary support group Axis V: 41-50 serious symptoms  ADL's:  Intact  Sleep: Good  Appetite:  Good  Suicidal Ideation:  Denies Homicidal Ideation:  Denies  Psychiatric Specialty Exam: Review of Systems  Constitutional: Negative.   HENT: Negative.   Eyes: Negative.   Respiratory: Negative.   Cardiovascular: Negative.   Gastrointestinal: Negative.   Genitourinary: Negative.   Musculoskeletal: Negative.   Skin: Negative.   Neurological: Negative.   Endo/Heme/Allergies: Negative.   Psychiatric/Behavioral: The patient is nervous/anxious.     Blood pressure 116/76, pulse 74, temperature 98.1 F (36.7 C), temperature  source Oral, resp. rate 20, height 5\' 8"  (1.727 m), weight 110.678 kg (244 lb).Body mass index is 37.11 kg/(m^2).  General Appearance: Casual  Eye Contact::  Fair  Speech:  Normal Rate  Volume:  Normal  Mood:  Anxious  Affect:  Congruent  Thought Process:  Coherent  Orientation:  Full (Time, Place, and Person)  Thought Content:  WDL  Suicidal Thoughts:  No  Homicidal Thoughts:  No  Memory:  Immediate;   Fair Recent;   Fair Remote;   Fair  Judgement:  Fair  Insight:  Fair  Psychomotor Activity:  Normal  Concentration:  Fair  Recall:  Fair  Akathisia:  No  Handed:  Right  AIMS (if indicated):     Assets:  Communication Skills Resilience Social Support  Sleep:  Number of Hours: 6   Current Medications: Current Facility-Administered Medications  Medication Dose Route Frequency Provider Last Rate Last Dose  . acetaminophen (TYLENOL) tablet 650 mg  650 mg Oral Q6H PRN Court Joy, PA-C      . alum & mag hydroxide-simeth (MAALOX/MYLANTA) 200-200-20 MG/5ML suspension 30 mL  30 mL Oral Q4H PRN Court Joy, PA-C      . DULoxetine (CYMBALTA) DR capsule 20 mg  20 mg Oral Daily Nanine Means, NP   20 mg at 10/13/12 0748  . lamoTRIgine (LAMICTAL) tablet 200 mg  200 mg Oral QHS Court Joy, PA-C   200 mg at 10/12/12 2004  . LORazepam (ATIVAN) tablet 2 mg  2 mg Oral QHS Court Joy, PA-C   2 mg at 10/12/12 2005  . magnesium hydroxide (MILK OF MAGNESIA) suspension  30 mL  30 mL Oral Daily PRN Court Joy, PA-C   30 mL at 10/10/12 1822  . QUEtiapine (SEROQUEL XR) 24 hr tablet 400 mg  400 mg Oral QHS Nehemiah Settle, MD   400 mg at 10/12/12 1810    Lab Results:  No results found for this or any previous visit (from the past 48 hour(s)).  Physical Findings: AIMS: Facial and Oral Movements Muscles of Facial Expression: None, normal Lips and Perioral Area: None, normal Jaw: None, normal Tongue: None, normal,Extremity Movements Upper (arms, wrists, hands,  fingers): None, normal Lower (legs, knees, ankles, toes): None, normal, Trunk Movements Neck, shoulders, hips: None, normal, Overall Severity Severity of abnormal movements (highest score from questions above): None, normal Incapacitation due to abnormal movements: None, normal Patient's awareness of abnormal movements (rate only patient's report): No Awareness, Dental Status Current problems with teeth and/or dentures?: No Does patient usually wear dentures?: No  CIWA:  CIWA-Ar Total: 1 COWS:  COWS Total Score: 1  Treatment Plan Summary: Daily contact with patient to assess and evaluate symptoms and progress in treatment Medication management  Plan:   Review of chart, vital signs, medications, and notes. 1-Individual and group therapy 2-Medication management for depression and anxiety:  Medications reviewed with the patient and no untoward effects, continue Seroquel, Lamictal for mood swings and Cymbalta for better mood control. May take protein diet from home supply. 3-Coping skills for depression and anxiety 4-Continue crisis stabilization and management 5-Address health issues--monitoring vital signs, stable 6-Treatment plan in progress, May discharge tomorrow AM.  Medical Decision Making Problem Points:  Established problem, stable/improving (1) and Review of psycho-social stressors (1) Data Points:  Review of medication regiment & side effects (2)  I certify that inpatient services furnished can reasonably be expected to improve the patient's condition.   Nehemiah Settle., MD  10/13/2012, 1:50 PM

## 2012-10-13 NOTE — Progress Notes (Signed)
Writer spoke with patient at medication window and she reports that she feels much better since taking her seroquel earlier. Patient is looking forward to discharge on tomorrow and plans to she her psychiatrist on tomorrow also. Patient voiced no complaints, denies si/hi/a/v hallucinations. Support and encouragement offered, safety maintained on unit, will conitnue to monitor.

## 2012-10-13 NOTE — Progress Notes (Signed)
Adult Psychoeducational Group Note  Date:  10/13/2012 Time:  10:40 PM  Group Topic/Focus:  Healthy Communication:   The focus of this group is to discuss communication, barriers to communication, as well as healthy ways to communicate with others.  Participation Level:  Active  Participation Quality:  Appropriate, Attentive, Sharing and Supportive  Affect:  Appropriate  Cognitive:  Alert, Appropriate and Oriented  Insight: Appropriate  Engagement in Group:  Engaged and Supportive  Modes of Intervention:  Problem-solving  Additional Comments:  Audre shared with the group how she activate her faith for coping tools   Annell Greening [place] 10/13/2012, 10:40 PM

## 2012-10-13 NOTE — Progress Notes (Signed)
D:  Patient's sleeps well, good appetite, normal energy level, good attention span.  Denied depression, hopelessness and anxiety.  Denied withdrawals.  After discharge, plans to continue to see psychiatrist along with psychotherapy treatment already established, participate in community activities and attend support groups.  Thank you again for your wonderful care.  Would like to talk to MD, wants to go to follow up appointment Wednesday with Dr. Nolen Mu if possible.      A:  Medications administered per MD orders.  Emotional support and encouragement given patient. R:  Denied SI and HI.  Denied A/V hallucinations.  Denied pain.  Will continue checks every 15 minutes for safety checks.  Checks maintained.

## 2012-10-13 NOTE — [facility] Group Notes (Signed)
[facility] LCSW Group Therapy      Feelings About Diagnosis 1:15 - 2:30 PM         10/13/2012    Type of Therapy:  Group Therapy  Participation Level:  Active  Participation Quality:  Appropriate  Affect:  Appropriate  Cognitive:  Alert and Appropriate  Insight:  Developing/Improving and Engaged  Engagement in Therapy:  Developing/Improving and Engaged  Modes of Intervention:  Discussion, Education, Exploration, Problem-Solving, Rapport Building, Support  Summary of Progress/Problems:  Patient actively participated in group. Patient discussed past and present diagnosis and the effects it has had on  life.  Patient talked about family and society being judgmental and the stigma associated with having a mental health diagnosis.  Patient shared she accepts her diagnosis and hopes to spend more time educating family and co-workers on Bipolar.  Wynn Banker 10/13/2012

## 2012-10-13 NOTE — Progress Notes (Signed)
Pt. attended spirituality group focusing on topic of grief and loss facilitated by chaplain.  Group facilitated awareness around loss in pt's lives. Members normalized and explored loss in relationships with self and others, explored how history and family expectations shape our expectations of we walk with grief.  As group members responded to one another, facilitator assisted group in drawing out themes in their grief work.    Casey Lang was attentive during group.  She did not contribute to discussion.   Belva Crome MDiv

## 2012-10-13 NOTE — [facility] Suicide Risk Assessment (Signed)
[facility] INPATIENT:  Family/Significant Other Suicide Prevention Education  Suicide Prevention Education:  Education Completed; Casey Lang, Mother, [phone];  has been identified by the patient as the family member/significant other with whom the patient will be residing, and identified as the person(s) who will aid the patient in the event of a mental health crisis (suicidal ideations/suicide attempt).  With written consent from the patient, the family member/significant other has been provided the following suicide prevention education, prior to the and/or following the discharge of the patient.  The suicide prevention education provided includes the following:  Suicide risk factors  Suicide prevention and interventions  National Suicide Hotline telephone number  [facility] assessment telephone number  [facility] Emergency Assistance 911  [facility] and/or Residential Mobile Crisis Unit telephone number  Request made of family/significant other to:  Remove weapons (e.g., guns, rifles, knives), all items previously/currently identified as safety concern.  Mother advised patient does not have access to weapons.    Remove drugs/medications (over-the-counter, prescriptions, illicit drugs), all items previously/currently identified as a safety concern.  The family member/significant other verbalizes understanding of the suicide prevention education information provided.  The family member/significant other agrees to remove the items of safety concern listed above.  Wynn Banker 10/13/2012, 3:02 PM

## 2012-10-13 NOTE — Progress Notes (Signed)
The focus of this group is to educate the patient on the purpose and policies of crisis stabilization and provide a format to answer questions about their admission.  The group details unit policies and expectations of patients while admitted.  Patient attended 0900 nurse education orientation group.  Patient actively participated, appropriate affect, alert, appropriate insight and engagement.  Patient will work on goals for discharge today.  

## 2012-10-13 NOTE — Progress Notes (Signed)
Adult Psychoeducational Group Note  Date:  10/13/2012 Time: 11:00am Group Topic/Focus:  Recovery Goals:   The focus of this group is to identify appropriate goals for recovery and establish a plan to achieve them.  Participation Level:  Active  Participation Quality:  Appropriate and Attentive  Affect:  Appropriate  Cognitive:  Alert and Appropriate  Insight: Appropriate  Engagement in Group:  Engaged  Modes of Intervention:  Discussion and Education  Additional Comments:  Pt attended and participated in group. When asked what recovery means to her pt stated its healing,and using coping strategies you learn.  Shelly Bombard D 10/13/2012, 2:13 PM

## 2012-10-14 MED ORDER — LAMOTRIGINE 200 MG PO TABS: 200.0000 mg | ORAL_TABLET | Freq: Every day | ORAL | Status: DC

## 2012-10-14 MED ORDER — DULOXETINE HCL 20 MG PO CPEP: 20.0000 mg | ORAL_CAPSULE | Freq: Every day | ORAL | Status: DC

## 2012-10-14 MED ORDER — LORAZEPAM 2 MG PO TABS: 2.0000 mg | ORAL_TABLET | Freq: Every day | ORAL | Status: DC

## 2012-10-14 MED ORDER — FERROUS SULFATE 325 (65 FE) MG PO TABS: 325.0000 mg | ORAL_TABLET | Freq: Every day | ORAL | Status: DC

## 2012-10-14 MED ORDER — QUETIAPINE FUMARATE ER 400 MG PO TB24: 400.0000 mg | ORAL_TABLET | Freq: Every day | ORAL | Status: DC

## 2012-10-14 NOTE — Tx Team (Signed)
Interdisciplinary Treatment Plan Update   Date Reviewed:  10/14/2012  Time Reviewed:  9:44 AM  Progress in Treatment:   Attending groups: Yes Participating in groups: Yes Taking medication as prescribed: Yes  Tolerating medication: Yes Family/Significant other contact made: Yes, contact made with mother,  Patient understands diagnosis: Yes  Discussing patient identified problems/goals with staff: Yes Medical problems stabilized or resolved: Yes Denies suicidal/homicidal ideation: Yes Patient has not harmed self or others: Yes  For review of initial/current patient goals, please see plan of care.  Estimated Length of Stay:  Discharge today  Reasons for Continued Hospitalization:   New Problems/Goals identified:    Discharge Plan or Barriers:   Home with outpatient follow up with Dr. Nolen Mu and Areta Haber  Additional Comments: N/A  Attendees:  Patient:  10/14/2012 9:44 AM   Signature: Mervyn Gay, MD 10/14/2012 9:44 AM  Signature:  Verne Spurr, PA 10/14/2012 9:44 AM  Signature:  Leighton Parody, RN 10/14/2012 9:44 AM  Signature:Beverly Terrilee Croak, RN 10/14/2012 9:44 AM  Signature:  Neill Loft RN 10/14/2012 9:44 AM  Signature:  Juline Patch, LCSW 10/14/2012 9:44 AM  Signature:  Reyes Ivan, LCSW 10/14/2012 9:44 AM  Signature:  Maseta Dorley,Care Coordinator 10/14/2012 9:44 AM  Signature: 10/14/2012 9:44 AM  Signature:    Signature:    Signature:      Scribe for Treatment Team:   Juline Patch,  10/14/2012 9:44 AM

## 2012-10-14 NOTE — Progress Notes (Signed)
Casey Lang was discharged home with family.  Follow up information, prescriptions, sample medications and discharge instructions given to patient who verbalized understanding.  Patient reports feeling much better and is happy to be going home.  No SI/HI/AVH at this time.

## 2012-10-14 NOTE — Progress Notes (Signed)
[facility] Adult Case Management Discharge Plan :  Will you be returning to the same living situation after discharge: Yes,  Patient will return to her home. At discharge, do you have transportation home?:Yes,  Patient will arrange transportation home. Do you have the ability to pay for your medications:Yes,  Patient is able to afford medications.  Release of information consent forms completed and in the chart;  Patient's signature needed at discharge.  Patient to Follow up at: Follow-up Information   Follow up with Dr. Emerson Monte On 10/14/2012. (Dr. Nolen Mu 3 PM 10/14/12)    Contact information:   [address] [place], [place]   [address]  [phone]      Follow up with Areta Haber, PC[facility] On 10/15/2012. Areta Haber on Thursday, October 15, 2012 at 11:00AM)    Contact information:   [address]  [place], [place]  [address]   [phone]      Patient denies SI/HI:  Patient no longer endorsing SI/HI or other thoughts of self harm.   Safety Planning and Suicide Prevention discussed:  .Reviewed with all patients during discharge planning group   Casey Lang, Casey Lang July 10/14/2012, 10:16 AM

## 2012-10-14 NOTE — Discharge Summary (Signed)
Physician Discharge Summary Note  Patient:  Casey Lang is an 32 y.o., female MRN:  [id number] DOB:  Jun 26, 1980 Patient phone:  [phone] (home)  Patient address:   [address] [place] [address],   Date of Admission:  10/09/2012 Date of Discharge: 10/14/2012   Reason for Admission:  Suicidal ideation  Discharge Diagnoses: Principal Problem:   Suicidal risk Active Problems:   Bipolar I disorder, most recent episode (or current) depressed, severe, without mention of psychotic behavior  ROS  DSM5: Depressive Disorders: Major Depressive Disorder - Severe (296.23)  AXIS I: Anxiety Disorder NOS and Bipolar, Depressed  AXIS II: Deferred  AXIS III:  Past Medical History   Diagnosis  Date   .  Right foot injury  10/09/2012     Resolved without complication   .  Mental disorder    .  Depression    .  Anxiety     AXIS IV: occupational problems, other psychosocial or environmental problems, problems related to social environment and problems with primary support group  AXIS V: 41-50 serious symptoms  Level of Care:  OP  Lang Course:  Casey Lang was admitted to [facility] after she reported increasing depression with suicidal ideation to a co-worker. Her mother brought her Casey Lang for stabilization and crisis management.      Upon arrival at the unit Casey Lang was evaluated and her symptoms were identified. She was oriented to the unit and encouraged to participate in unit programming. Medication management was initiated with Cymbalta titrated to 40mg  po qd, and Seroquel 400mg  at HS.       Response to treatment was noted each day with the patient's report of decreasing symptoms, improved mood, affect and sleep. She tolerated the medication well and reported no side effects.      The patient was active in groups and did not require 1:1 supervision during her admission. By the day of discharge she was in much improved condition than upon arrival. She denied SI/HI voiced no  AVH and was in full contact with reality. She was discharged home with plans to follow up as noted below.  Consults:  None  Significant Diagnostic Studies:  None  Discharge Vitals:   Blood pressure 126/84, pulse 87, temperature 98 F (36.7 C), temperature source Oral, resp. rate 16, height 5\' 8"  (1.727 m), weight 110.678 kg (244 lb). Body mass index is 37.11 kg/(m^2). Lab Results:   No results found for this or any previous visit (from the past 72 hour(s)).  Physical Findings: AIMS: Facial and Oral Movements Muscles of Facial Expression: None, normal Lips and Perioral Area: None, normal Jaw: None, normal Tongue: None, normal,Extremity Movements Upper (arms, wrists, hands, fingers): None, normal Lower (legs, knees, ankles, toes): None, normal, Trunk Movements Neck, shoulders, hips: None, normal, Overall Severity Severity of abnormal movements (highest score from questions above): None, normal Incapacitation due to abnormal movements: None, normal Patient's awareness of abnormal movements (rate only patient's report): No Awareness, Dental Status Current problems with teeth and/or dentures?: No Does patient usually wear dentures?: No  CIWA:  CIWA-Ar Total: 1 COWS:  COWS Total Score: 1  Psychiatric Specialty Exam: See Psychiatric Specialty Exam and Suicide Risk Assessment completed by Attending Physician prior to discharge.  Discharge destination:  Home  Is patient on multiple antipsychotic therapies at discharge:  No   Has Patient had three or more failed trials of antipsychotic monotherapy by history:  No  Recommended Plan for Multiple Antipsychotic Therapies: NA  Discharge Orders  Future Orders Complete By Expires   Diet - low sodium heart healthy  As directed    Discharge instructions  As directed    Comments:     Take all of your medications as directed. Be sure to keep all of your follow up appointments.  If you are unable to keep your follow up appointment, call your  Doctor's office to let them know, and reschedule.  Make sure that you have enough medication to last until your appointment. Be sure to get plenty of rest. Going to bed at the same time each night will help. Try to avoid sleeping during the day.  Increase your activity as tolerated. Regular exercise will help you to sleep better and improve your mental health. Eating a heart healthy diet is recommended. Try to avoid salty or fried foods. Be sure to avoid all alcohol and illegal drugs.   Increase activity slowly  As directed        Medication List    STOP taking these medications       Biotin 1 MG Caps     calcium citrate 950 MG tablet  Commonly known as:  CALCITRATE - dosed in mg elemental calcium     cholecalciferol 1000 UNITS tablet  Commonly known as:  VITAMIN D     multivitamin with minerals Tabs tablet      TAKE these medications     Indication   DULoxetine 20 MG capsule  Commonly known as:  CYMBALTA  Take 1 capsule (20 mg total) by mouth daily. For depression.   Indication:  Major Depressive Disorder     ferrous sulfate 325 (65 FE) MG tablet  Take 1 tablet (325 mg total) by mouth daily with breakfast. For low iron.   Indication:  Iron Deficiency     lamoTRIgine 200 MG tablet  Commonly known as:  LAMICTAL  Take 1 tablet (200 mg total) by mouth at bedtime. For depression.   Indication:  Depression     LORazepam 2 MG tablet  Commonly known as:  ATIVAN  Take 1 tablet (2 mg total) by mouth at bedtime. For anxiety and sleep.   Indication:  Feeling Anxious     QUEtiapine 400 MG 24 hr tablet  Commonly known as:  SEROQUEL XR  Take 1 tablet (400 mg total) by mouth at bedtime. For depression.   Indication:  Major Depressive Disorder           Follow-up Information   Follow up with Dr. Emerson Monte On 10/14/2012. (Dr. Nolen Mu 3 PM 10/14/12)    Contact information:   [address] [place], [place]   [address]  [phone]      Follow up with Areta Haber, PCNS  On 10/15/2012. Areta Haber on Thursday, October 15, 2012 at 11:00AM)    Contact information:   [address]  [place], [place]  [address]   [phone]      Follow-up recommendations:   Activities: Resume activity as tolerated. Diet: Heart healthy low sodium diet Tests: Follow up testing will be determined by your out patient provider.  Comments:    Total Discharge Time:  Greater than 30 minutes.  Signed: Rona Ravens. [place] RPAC 11:42 AM 10/14/2012  Patient is seen personally, completed suicidal risk assessment, case discussed with treatment team, and developed discharge plan. Reviewed the information documented and agree with the treatment plan.  Einar Nolasco,JANARDHAHA R. 10/19/2012 1:45 PM

## 2012-10-14 NOTE — [facility] Group Notes (Signed)
[facility] LCSW Group Therapy  Emotional Regulation 1:15 - 2: 30 PM        10/14/2012     Type of Therapy:  Group Therapy  Participation Level:  Appropriate  Participation Quality:  Appropriate  Affect:  Appropriate  Cognitive:  Attentive Appropriate  Insight:  Engaged  Engagement in Therapy:  Engaged  Modes of Intervention:  Discussion Exploration Problem-Solving Supportive  Summary of Progress/Problems:  Group topic was emotional regulations.  Patient participated in the discussion and was able to identify an emotion that needed to regulated.  Patient was able to identify approprite coping skills.  Wynn Banker 10/14/2012

## 2012-10-14 NOTE — [facility] Group Notes (Signed)
[facility] LCSW Aftercare Discharge Planning Group Note   10/14/2012 8:34 AM    Participation Quality:  Appropraite  Mood/Affect:  Appropriate  Depression Rating:    Anxiety Rating:    Thoughts of Suicide:  No  Will you contract for safety?   NA  Current AVH:  No  Plan for Discharge/Comments:  Patient attending discharge planning group and actively participated in group.  She reports she will follow up with Dr. Nolen Mu and Areta Haber for counseling.  CSW provided all participants with daily workbook and information on services offered by [facility].   Transportation Means: Patient has transportation.   Supports:  Patient has a support system.   Camdyn Laden, Joesph July

## 2012-10-14 NOTE — BHH Suicide Risk Assessment (Signed)
Suicide Risk Assessment  Discharge Assessment     Demographic Factors:  Adolescent or young adult and Low socioeconomic status  Mental Status Per Nursing Assessment::   On Admission:  Self-harm thoughts  Current Mental Status by Physician: NA  Loss Factors: Decrease in vocational status and Financial problems/change in socioeconomic status  Historical Factors: Prior suicide attempts and Impulsivity  Risk Reduction Factors:   Sense of responsibility to family, Religious beliefs about death, Living with another person, especially a relative, Positive social support, Positive therapeutic relationship and Positive coping skills or problem solving skills  Continued Clinical Symptoms:  Bipolar Disorder:   Depressive phase Previous Psychiatric Diagnoses and Treatments Medical Diagnoses and Treatments/Surgeries  Cognitive Features That Contribute To Risk:  Polarized thinking    Suicide Risk:  Minimal: No identifiable suicidal ideation.  Patients presenting with no risk factors but with morbid ruminations; may be classified as minimal risk based on the severity of the depressive symptoms  Discharge Diagnoses:   AXIS I:  Bipolar, Depressed AXIS II:  Deferred AXIS III:   Past Medical History  Diagnosis Date  . Right foot injury 10/09/2012    Resolved without complication  . Mental disorder   . Depression   . Anxiety    AXIS IV:  economic problems, occupational problems, other psychosocial or environmental problems, problems related to social environment and problems with primary support group AXIS V:  61-70 mild symptoms  Plan Of Care/Follow-up recommendations:  Activity:  As tolerated Diet:  Regular  Is patient on multiple antipsychotic therapies at discharge:  No   Has Patient had three or more failed trials of antipsychotic monotherapy by history:  No  Recommended Plan for Multiple Antipsychotic Therapies: NA  Casey Lang,Casey R. 10/14/2012, 12:55 PM

## 2012-10-19 NOTE — Progress Notes (Signed)
Patient Discharge Instructions:  After Visit Summary (AVS):   Faxed to:  10/19/12 Discharge Summary Note:   Faxed to:  10/19/12 Psychiatric Admission Assessment Note:   Faxed to:  10/19/12 Suicide Risk Assessment - Discharge Assessment:   Faxed to:  10/19/12 Faxed/Sent to the Next Level Care provider:  10/19/12 Faxed to Areta Haber @ [phone] Faxed to Dr. Emerson Monte @ [phone]  Jerelene Redden, 10/19/2012, 2:43 PM

## 2015-02-28 ENCOUNTER — Encounter (HOSPITAL_COMMUNITY): Payer: Self-pay | Admitting: Emergency Medicine

## 2015-02-28 ENCOUNTER — Emergency Department (HOSPITAL_COMMUNITY)
Admission: EM | Admit: 2015-02-28 | Discharge: 2015-02-28 | Disposition: A | Discharge status: Other Acute Inpatient Hospital | Payer: BLUE CROSS/BLUE SHIELD | Attending: Emergency Medicine | Admitting: Emergency Medicine

## 2015-02-28 ENCOUNTER — Ambulatory Visit (HOSPITAL_COMMUNITY)
Admission: EM | Admit: 2015-02-28 | Discharge: 2015-02-28 | Disposition: A | Discharge status: Home / Self Care | Payer: BLUE CROSS/BLUE SHIELD | Source: Intra-hospital | Attending: Psychiatry | Admitting: Psychiatry

## 2015-02-28 ENCOUNTER — Encounter (HOSPITAL_COMMUNITY): Payer: Self-pay | Admitting: *Deleted

## 2015-02-28 ENCOUNTER — Inpatient Hospital Stay (HOSPITAL_COMMUNITY)
Admission: EM | Admit: 2015-02-28 | Discharge: 2015-03-03 | DRG: 885 | Disposition: A | Discharge status: Home / Self Care | Payer: BLUE CROSS/BLUE SHIELD | Source: Intra-hospital | Attending: Psychiatry | Admitting: Psychiatry

## 2015-02-28 DIAGNOSIS — F319 Bipolar disorder, unspecified: Secondary | ICD-10-CM | POA: Diagnosis not present

## 2015-02-28 DIAGNOSIS — Z87828 Personal history of other (healed) physical injury and trauma: Secondary | ICD-10-CM | POA: Diagnosis not present

## 2015-02-28 DIAGNOSIS — F419 Anxiety disorder, unspecified: Secondary | ICD-10-CM | POA: Diagnosis not present

## 2015-02-28 DIAGNOSIS — F1310 Sedative, hypnotic or anxiolytic abuse, uncomplicated: Secondary | ICD-10-CM | POA: Diagnosis not present

## 2015-02-28 DIAGNOSIS — F3164 Bipolar disorder, current episode mixed, severe, with psychotic features: Secondary | ICD-10-CM | POA: Diagnosis present

## 2015-02-28 DIAGNOSIS — Z79899 Other long term (current) drug therapy: Secondary | ICD-10-CM | POA: Insufficient documentation

## 2015-02-28 DIAGNOSIS — F919 Conduct disorder, unspecified: Secondary | ICD-10-CM | POA: Diagnosis not present

## 2015-02-28 DIAGNOSIS — E221 Hyperprolactinemia: Secondary | ICD-10-CM | POA: Diagnosis present

## 2015-02-28 DIAGNOSIS — Z008 Encounter for other general examination: Secondary | ICD-10-CM | POA: Diagnosis present

## 2015-02-28 HISTORY — DX: Bipolar disorder, unspecified: F31.9

## 2015-02-28 LAB — COMPREHENSIVE METABOLIC PANEL
ALBUMIN: 4.1 g/dL (ref 3.5–5.0)
ALK PHOS: 41 U/L (ref 38–126)
ALT: 11 U/L — AB (ref 14–54)
AST: 18 U/L (ref 15–41)
Anion gap: 7 (ref 5–15)
BILIRUBIN TOTAL: 0.6 mg/dL (ref 0.3–1.2)
BUN: 21 mg/dL — AB (ref 6–20)
CALCIUM: 9.6 mg/dL (ref 8.9–10.3)
CO2: 27 mmol/L (ref 22–32)
CREATININE: 1.08 mg/dL — AB (ref 0.44–1.00)
Chloride: 110 mmol/L (ref 101–111)
GFR calc Af Amer: >60 mL/min (ref >60)
GFR calc non Af Amer: >60 mL/min (ref >60)
GLUCOSE: 94 mg/dL (ref 65–99)
Potassium: 3.8 mmol/L (ref 3.5–5.1)
Sodium: 144 mmol/L (ref 135–145)
TOTAL PROTEIN: 7.1 g/dL (ref 6.5–8.1)

## 2015-02-28 LAB — ETHANOL: Alcohol, Ethyl (B): <5 mg/dL (ref <5)

## 2015-02-28 LAB — CBC
HEMATOCRIT: 36.7 % (ref 36.0–46.0)
Hemoglobin: 11.7 g/dL — ABNORMAL LOW (ref 12.0–15.0)
MCH: 29.6 pg (ref 26.0–34.0)
MCHC: 31.9 g/dL (ref 30.0–36.0)
MCV: 92.9 fL (ref 78.0–100.0)
Platelets: 237 10*3/uL (ref 150–400)
RBC: 3.95 MIL/uL (ref 3.87–5.11)
RDW: 14.2 % (ref 11.5–15.5)
WBC: 3.1 10*3/uL — AB (ref 4.0–10.5)

## 2015-02-28 LAB — HCG, QUANTITATIVE, PREGNANCY: hCG, Beta Chain, Quant, S: <1 m[IU]/mL (ref <5)

## 2015-02-28 LAB — SALICYLATE LEVEL: Salicylate Lvl: <4 mg/dL (ref 2.8–30.0)

## 2015-02-28 LAB — RAPID URINE DRUG SCREEN, HOSP PERFORMED
Amphetamines: NOT DETECTED
BARBITURATES: NOT DETECTED
Benzodiazepines: POSITIVE — AB
Cocaine: NOT DETECTED
Opiates: NOT DETECTED
TETRAHYDROCANNABINOL: NOT DETECTED

## 2015-02-28 LAB — ACETAMINOPHEN LEVEL: Acetaminophen (Tylenol), Serum: <10 ug/mL — ABNORMAL LOW (ref 10–30)

## 2015-02-28 MED ORDER — ACETAMINOPHEN 325 MG PO TABS: 650.0000 mg | ORAL_TABLET | Freq: Four times a day (QID) | ORAL | Status: DC | PRN

## 2015-02-28 MED ORDER — ALUM & MAG HYDROXIDE-SIMETH 200-200-20 MG/5ML PO SUSP: 30.0000 mL | ORAL | Status: DC | PRN

## 2015-02-28 MED ORDER — ONDANSETRON HCL 4 MG PO TABS: 4.0000 mg | ORAL_TABLET | Freq: Three times a day (TID) | ORAL | Status: DC | PRN

## 2015-02-28 MED ORDER — PREMIER PROTEIN SHAKE
2.0000 [oz_av] | Freq: Two times a day (BID) | ORAL | Status: DC
  Administered 2015-02-28 – 2015-03-03 (×6): 2 [oz_av] via ORAL
  Filled 2015-02-28: qty 325.31

## 2015-02-28 MED ORDER — LORAZEPAM 1 MG PO TABS: 1.0000 mg | ORAL_TABLET | Freq: Two times a day (BID) | ORAL | Status: DC

## 2015-02-28 MED ORDER — OLANZAPINE 10 MG IM SOLR: 5.0000 mg | Freq: Four times a day (QID) | INTRAMUSCULAR | Status: DC | PRN

## 2015-02-28 MED ORDER — QUETIAPINE FUMARATE ER 300 MG PO TB24
600.0000 mg | ORAL_TABLET | Freq: Every day | ORAL | Status: DC
  Administered 2015-02-28: 600 mg via ORAL
  Filled 2015-02-28 (×2): qty 2

## 2015-02-28 MED ORDER — ACETAMINOPHEN 325 MG PO TABS: 650.0000 mg | ORAL_TABLET | ORAL | Status: DC | PRN

## 2015-02-28 MED ORDER — LORAZEPAM 1 MG PO TABS: 1.0000 mg | ORAL_TABLET | Freq: Three times a day (TID) | ORAL | Status: DC | PRN

## 2015-02-28 MED ORDER — MAGNESIUM HYDROXIDE 400 MG/5ML PO SUSP
30.0000 mL | Freq: Every day | ORAL | Status: DC | PRN
  Administered 2015-03-01 – 2015-03-03 (×3): 30 mL via ORAL
  Filled 2015-02-28 (×3): qty 30

## 2015-02-28 MED ORDER — LAMOTRIGINE 150 MG PO TABS
300.0000 mg | ORAL_TABLET | Freq: Every day | ORAL | Status: DC
  Filled 2015-02-28: qty 2

## 2015-02-28 MED ORDER — FERROUS SULFATE 325 (65 FE) MG PO TABS
325.0000 mg | ORAL_TABLET | Freq: Every day | ORAL | Status: DC
  Filled 2015-02-28 (×2): qty 1

## 2015-02-28 MED ORDER — QUETIAPINE FUMARATE ER 300 MG PO TB24
600.0000 mg | ORAL_TABLET | Freq: Every day | ORAL | Status: DC
  Filled 2015-02-28: qty 2

## 2015-02-28 MED ORDER — IBUPROFEN 200 MG PO TABS: 600.0000 mg | ORAL_TABLET | Freq: Three times a day (TID) | ORAL | Status: DC | PRN

## 2015-02-28 MED ORDER — LAMOTRIGINE 150 MG PO TABS
300.0000 mg | ORAL_TABLET | Freq: Every evening | ORAL | Status: DC
  Administered 2015-02-28 – 2015-03-02 (×3): 300 mg via ORAL
  Filled 2015-02-28 (×5): qty 2

## 2015-02-28 MED ORDER — FERROUS SULFATE 325 (65 FE) MG PO TABS
325.0000 mg | ORAL_TABLET | Freq: Every day | ORAL | Status: DC
  Administered 2015-03-01 – 2015-03-03 (×3): 325 mg via ORAL
  Filled 2015-02-28 (×5): qty 1

## 2015-02-28 MED ORDER — LAMOTRIGINE 25 MG PO TABS
25.0000 mg | ORAL_TABLET | Freq: Every day | ORAL | Status: DC
  Administered 2015-02-28 – 2015-03-03 (×4): 25 mg via ORAL
  Filled 2015-02-28 (×7): qty 1

## 2015-02-28 MED ORDER — INFLUENZA VAC SPLIT QUAD 0.5 ML IM SUSY
0.5000 mL | PREFILLED_SYRINGE | INTRAMUSCULAR | Status: AC
  Administered 2015-03-01: 0.5 mL via INTRAMUSCULAR
  Filled 2015-02-28: qty 0.5

## 2015-02-28 MED ORDER — CLONAZEPAM 0.5 MG PO TABS
0.5000 mg | ORAL_TABLET | Freq: Three times a day (TID) | ORAL | Status: DC | PRN
  Administered 2015-02-28 – 2015-03-02 (×5): 0.5 mg via ORAL
  Filled 2015-02-28 (×5): qty 1

## 2015-02-28 MED ORDER — ZOLPIDEM TARTRATE 5 MG PO TABS: 5.0000 mg | ORAL_TABLET | Freq: Every evening | ORAL | Status: DC | PRN

## 2015-02-28 MED ORDER — HALOPERIDOL LACTATE 5 MG/ML IJ SOLN: 5.0000 mg | Freq: Once | INTRAMUSCULAR | Status: DC | PRN

## 2015-02-28 MED ORDER — OLANZAPINE 5 MG PO TABS: 5.0000 mg | ORAL_TABLET | Freq: Four times a day (QID) | ORAL | Status: DC | PRN

## 2015-02-28 NOTE — [facility] Suicide Risk Assessment (Signed)
[facility] Admission Suicide Risk Assessment   Nursing information obtained from:    Demographic factors:    Current Mental Status:    Loss Factors:    Historical Factors:    Risk Reduction Factors:    Total Time spent with patient: 30 minutes Principal Problem: Bipolar disorder, curr episode mixed, severe, with psychotic features ([place]) Diagnosis:   Patient Active Problem List   Diagnosis Date Noted  . Bipolar disorder, curr episode mixed, severe, with psychotic features ([place]) [F31.64] 02/28/2015  . Suicidal risk [IMO0002] 10/09/2012     Continued Clinical Symptoms:  Alcohol Use Disorder Identification Test Final Score (AUDIT): 0 The "Alcohol Use Disorders Identification Test", Guidelines for Use in Primary Care, Second Edition.  World Pharmacologist [facility]). Score between 0-7:  no or low risk or alcohol related problems. Score between 8-15:  moderate risk of alcohol related problems. Score between 16-19:  high risk of alcohol related problems. Score 20 or above:  warrants further diagnostic evaluation for alcohol dependence and treatment.   CLINICAL FACTORS:   Previous Psychiatric Diagnoses and Treatments   Musculoskeletal: Strength & Muscle Tone: within normal limits Gait & Station: normal Patient leans: N/A  Psychiatric Specialty Exam: Physical Exam  Review of Systems  Psychiatric/Behavioral: Positive for depression. The patient is nervous/anxious and has insomnia.   All other systems reviewed and are negative.   Blood pressure 114/82, pulse 93, temperature 97.7 F (36.5 C), temperature source Oral, resp. rate 16, height 5\' 8"  (1.727 m), weight 111.131 kg (245 lb), last menstrual period 01/31/2015, SpO2 100 %.Body mass index is 37.26 kg/(m^2).                      Please see H&P.                                    COGNITIVE FEATURES THAT CONTRIBUTE TO RISK:  Closed-mindedness, Polarized thinking and Thought constriction (tunnel vision)     SUICIDE RISK:   Moderate:  Frequent suicidal ideation with limited intensity, and duration, some specificity in terms of plans, no associated intent, good self-control, limited dysphoria/symptomatology, some risk factors present, and identifiable protective factors, including available and accessible social support.  PLAN OF CARE:Please see H&P.   Medical Decision Making:  Review of Psycho-Social Stressors (1), Review or order clinical lab tests (1), Decision to obtain old records (1), Review and summation of old records (2), Established Problem, Worsening (2), Review of Last Therapy Session (1) and Review of New Medication or Change in Dosage (2)  I certify that inpatient services furnished can reasonably be expected to improve the patient's condition.   Lanell Dubie MD 02/28/2015, 11:49 AM

## 2015-02-28 NOTE — Progress Notes (Signed)
Adult Psychoeducational Group Note  Date:  02/28/2015 Time:  8:25 PM  Group Topic/Focus:  Wrap-Up Group:   The focus of this group is to help patients review their daily goal of treatment and discuss progress on daily workbooks.  Participation Level:  Active  Participation Quality:  Appropriate and Attentive  Affect:  Appropriate  Cognitive:  Appropriate  Insight: Appropriate  Engagement in Group:  Engaged  Modes of Intervention:  Discussion  Additional Comments:  Pt overall had a good day on her first day. Pt goal for tomorrow is to keep her mind at peace.   Jerline Pain 02/28/2015, 8:25 PM

## 2015-02-28 NOTE — ED Provider Notes (Signed)
CSN: [id number]     Arrival date & time 02/28/15  0355 History   First MD Initiated Contact with Patient 02/28/15 0615     Chief Complaint  Patient presents with  . Medical Clearance     (Consider location/radiation/quality/duration/timing/severity/associated sxs/prior Treatment) HPI   Casey Lang is a 35 y.o. female, with a history of bipolar, anxiety, and MDD, presenting to the ED with "frequent mood swings." Pt states, "I just get so angry that I want to hurt someone, then I calm down and cry and feel suicidal." Pt denies being currently suicidal or a current desire to harm anyone. Pt endorses compliance with her medications. Denies alcohol or illicit drug use. 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 Pt denies A/V hallucinations. Patient's psychiatrist is Dr. Caprice Beaver, with her last visit in August 2016. Pt states the update to her medications are: 1mg  Ativan BID, 300mg  Lamictal at bedtime. Patient is already admitted to [facility], but was sent over here for medical clearance.  Past Medical History  Diagnosis Date  . Right foot injury 10/09/2012    Resolved without complication  . Mental disorder   . Depression   . Anxiety   . Bipolar 1 disorder [facility])    Past Surgical History  Procedure Laterality Date  . Laparoscopic gastric sleeve resection  10/09/2012    Surgery was on 09/30/2011  . Foot surgery     Family History  Problem Relation Age of Onset  . Hypertension Mother   . Diabetes Mother   . Diabetes Father   . Hypertension Father   . Cancer Other   . Alzheimer's disease Other   . Aneurysm Other   . Anxiety disorder Other   . Depression Other    Social History  Substance Use Topics  . Smoking status: Never Smoker   . Smokeless tobacco: Never Used  . Alcohol Use: No   OB History    No data available     Review of Systems  Constitutional:       Medical clearance  Psychiatric/Behavioral: Positive for behavioral problems and dysphoric mood.   All other systems reviewed and are negative.     Allergies  Morphine and Morphine and related  Home Medications   Prior to Admission medications   Medication Sig Start Date End Date Taking? Authorizing Provider  calcium citrate (CALCITRATE - DOSED IN MG ELEMENTAL CALCIUM) 950 MG tablet Take 200 mg of elemental calcium by mouth daily.   Yes Historical Provider, MD  cholecalciferol (VITAMIN D) 1000 units tablet Take 1,000 Units by mouth daily.   Yes Historical Provider, MD  [employer] BIOTIN PO Take 1 tablet by mouth daily.   Yes Historical Provider, MD  ferrous sulfate 325 (65 FE) MG tablet Take 1 tablet (325 mg total) by mouth daily with breakfast. For low iron. 10/14/12  Yes Marlane Hatcher Mashburn, PA-C  lamoTRIgine (LAMICTAL) 150 MG tablet Take 300 mg by mouth daily.   Yes Historical Provider, MD  LORazepam (ATIVAN) 2 MG tablet Take 1 tablet (2 mg total) by mouth at bedtime. For anxiety and sleep. Patient taking differently: Take 1 mg by mouth 2 (two) times daily. For anxiety and sleep. 10/14/12  Yes Ruben Im, PA-C  Multiple Vitamin (MULTIVITAMIN WITH MINERALS) TABS tablet Take 1 tablet by mouth daily.   Yes Historical Provider, MD  Omega-3 Fatty Acids (FISH OIL PO) Take 1 capsule by mouth daily.   Yes Historical Provider, MD  QUEtiapine (SEROQUEL XR) 200 MG  24 hr tablet Take 600 mg by mouth at bedtime.   Yes Historical Provider, MD  Calcium Citrate POWD 630 mg. 09/26/11   Historical Provider, MD  Cholecalciferol (D 1000) 1000 units capsule Take by mouth.    Historical Provider, MD  DULoxetine (CYMBALTA) 20 MG capsule Take 20 mg by mouth. 10/14/12   Historical Provider, MD  lamoTRIgine (LAMICTAL) 200 MG tablet Take 200 mg by mouth. 08/06/11   Historical Provider, MD  Multiple Vitamin (MULTI-VITAMINS) TABS Take by mouth.    Historical Provider, MD  Omega-3 1000 MG CAPS Take by mouth.    Historical Provider, MD  QUEtiapine (SEROQUEL XR) 400 MG 24 hr tablet Take 400 mg by mouth. 08/06/11   Historical  Provider, MD  ursodiol (ACTIGALL) 300 MG capsule Take 300 mg by mouth. 01/14/12   Historical Provider, MD   BP 133/98 mmHg  Pulse 63  Temp(Src) 97.6 F (36.4 C) (Oral)  Resp 16  SpO2 99%  LMP 01/31/2015 (Exact Date) Physical Exam  Constitutional: She is oriented to person, place, and time. She appears well-developed and well-nourished. No distress.  HENT:  Head: Normocephalic and atraumatic.  Eyes: Conjunctivae and EOM are normal. Pupils are equal, round, and reactive to light.  Neck: Normal range of motion. Neck supple.  Cardiovascular: Normal rate, regular rhythm, normal heart sounds and intact distal pulses.   Pulmonary/Chest: Effort normal and breath sounds normal. No respiratory distress.  Abdominal: Soft. Bowel sounds are normal.  Musculoskeletal: She exhibits no edema or tenderness.  Lymphadenopathy:    She has no cervical adenopathy.  Neurological: She is alert and oriented to person, place, and time. She has normal reflexes.  No sensory deficits. Strength 5/5 in all extremities. No gait disturbance. Coordination intact. Cranial nerves III-XII grossly intact. No facial droop.   Skin: Skin is warm and dry. She is not diaphoretic.  Nursing note and vitals reviewed.   ED Course  Procedures (including critical care time) Labs Review Labs Reviewed  COMPREHENSIVE METABOLIC PANEL - Abnormal; Notable for the following:    BUN 21 (*)    Creatinine, Ser 1.08 (*)    ALT 11 (*)    All other components within normal limits  ACETAMINOPHEN LEVEL - Abnormal; Notable for the following:    Acetaminophen (Tylenol), Serum <10 (*)    All other components within normal limits  CBC - Abnormal; Notable for the following:    WBC 3.1 (*)    Hemoglobin 11.7 (*)    All other components within normal limits  URINE RAPID DRUG SCREEN, HOSP PERFORMED - Abnormal; Notable for the following:    Benzodiazepines POSITIVE (*)    All other components within normal limits  ETHANOL  SALICYLATE LEVEL   HCG, QUANTITATIVE, PREGNANCY  LAMOTRIGINE LEVEL    I have personally reviewed and evaluated these lab results as part of my medical decision-making.   EKG Interpretation None      MDM   Final diagnoses:  Medical clearance for psychiatric admission    Sharyon Janco presents for medical clearance from [facility].  This patient does not require the typical psych hold orders. She is already admitted to behavioral health. Patient's lab abnormalities are consistent with her previous results. Patient is nontoxic appearing, not tachycardic, not tachypneic, is normotensive, is afebrile, maintains SPO2 99% on room air, and is in no apparent distress. Patient noted to be sleeping comfortably upon my initial contact, but was easily arousable. No acute or concerning abnormalities were found on the patient's labs. Patient  was medically cleared and transferred back to [facility].    Lorayne Bender, PA-C 02/28/15 [place], MD 02/28/15 2046
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
## 2015-02-28 NOTE — ED Notes (Signed)
Pt states she has bipolar type 1 disorder and has been having mood swings  Pt states they have been quite severe where she is easily upset and destructive and then has crying spells when the high and destructive period wears off  Pt sates she has recently got married and he does not fully understand bipolar disorder and it is causing lots of stress  Pt states she is tired of going through this and is tired of dragging her husband through it with her  Pt states she had suicidal thoughts after recent events but denies any at this time  Pt states she is currently taking her medication as prescribed  Pt was seen at [facility] and sent here for medical clearance and may return once she is cleared
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
## 2015-02-28 NOTE — Progress Notes (Signed)
Casey Lang informed Probation officer that she had gastric sleeve a few years back and is used to eating a high protein and low carb diet.  Order obtained for premier protein shake bid between meals.  Informed her that she can ask for sugar free options at snack time to watch her carb limit and notified techs on the unit of the above.  She also stated that she has "horrible flat right foot" and left her brace in the locker.  She feels like she will need that.  Order obtained and retrieved brace from the locker.  We will continue to monitor the progress towards her goals.
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
## 2015-02-28 NOTE — [facility] Group Notes (Signed)
[facility] Group Notes:  (Nursing/MHT/Case Management/Adjunct)  Date:  02/28/2015  Time:  10:30 AM  Type of Therapy:  Nurse Education  Participation Level:  Minimal  Participation Quality:  Appropriate  Affect:  Flat  Cognitive:  Alert and Appropriate  Insight:  Appropriate  Engagement in Group:  She just arrived on the unit.  She attended group but didn't participate.  Modes of Intervention:  Discussion and Education  Summary of Progress/Problems:  Group topic today was recovery.  Discussed coping skills and developing a "tool box" to use when feeling bad.  Discussed sleep hygiene.    Casey Lang V [name] 02/28/2015, 10:30 AM

## 2015-02-28 NOTE — Tx Team (Signed)
Initial Interdisciplinary Treatment Plan   PATIENT STRESSORS: Marital or family conflict Occupational concerns   PATIENT STRENGTHS: Ability for insight Communication skills Motivation for treatment/growth Physical Health Supportive family/friends   PROBLEM LIST: Problem List/Patient Goals Date to be addressed Date deferred Reason deferred Estimated date of resolution  Mood stabilization 02/28/2015     Suicidal Ideation 02/28/2015     Marital conflict [id number]                                          DISCHARGE CRITERIA:  Improved stabilization in mood, thinking, and/or behavior Need for constant or close observation no longer present Verbal commitment to aftercare and medication compliance  PRELIMINARY DISCHARGE PLAN: Return to previous living arrangement Return to previous work or school arrangements  PATIENT/FAMIILY INVOLVEMENT: This treatment plan has been presented to and reviewed with the patient, Casey Lang.  The patient and family have been given the opportunity to ask questions and make suggestions.  Zipporah Plants 02/28/2015, 10:15 AM

## 2015-03-01 LAB — LAMOTRIGINE LEVEL: Lamotrigine Lvl: 9.6 ug/mL (ref 2.0–20.0)

## 2015-03-01 LAB — LIPID PANEL
CHOL/HDL RATIO: 3.2 ratio
CHOLESTEROL: 147 mg/dL (ref 0–200)
HDL: 46 mg/dL (ref >40)
LDL CALC: 90 mg/dL (ref 0–99)
TRIGLYCERIDES: 55 mg/dL (ref <150)
VLDL: 11 mg/dL (ref 0–40)

## 2015-03-01 LAB — TSH: TSH: 1.268 u[IU]/mL (ref 0.350–4.500)

## 2015-03-01 MED ORDER — QUETIAPINE FUMARATE ER 300 MG PO TB24
600.0000 mg | ORAL_TABLET | Freq: Every day | ORAL | Status: DC
  Administered 2015-03-01 – 2015-03-02 (×2): 600 mg via ORAL
  Filled 2015-03-01 (×4): qty 2

## 2015-03-01 NOTE — [facility]HH Group Notes (Signed)
[facility] LCSW Group Therapy  03/01/2015 1:31 PM  Type of Therapy: Group Therapy  Participation Level: Invited. Chose not to attend.  Summary of Progress/Problems: Casey Lang from the [facility] was here to tell his story of recovery and play his guitar.   [name]. [name] 03/01/2015 1:31 PM

## 2015-03-01 NOTE — Progress Notes (Signed)
Adult Psychoeducational Group Note  Date:  03/01/2015 Time:  8:27 PM  Group Topic/Focus:  Wrap-Up Group:   The focus of this group is to help patients review their daily goal of treatment and discuss progress on daily workbooks.  Participation Level:  Active  Participation Quality:  Appropriate and Attentive  Affect:  Appropriate  Cognitive:  Appropriate  Insight: Appropriate  Engagement in Group:  Engaged  Modes of Intervention:  Discussion  Additional Comments:  Pt rated overall day an 8 out of 10 because he family came to visit her today. Pt also noted that the visit with her family also resurfaced some anxiety. Pt reported that she accomplished her goal for the day, which was "to be at peace" and pray and use her coping skills to help her with her anxiety. Pt noted that she thoroughly enjoyed the music therapy group today.    Lincoln Brigham 03/01/2015, 9:06 PM

## 2015-03-01 NOTE — Progress Notes (Signed)
D. Pt had been up and visible in milieu this evening, did attend and participate in evening group activity. Pt spoke about how she has been feeling out of balance for awhile even though she has been taking all her medications as prescribed. Pt spoke about how she needed to be here to get back into balance and have a sound peace of mind. Pt did receive bedtime medication without incident and did not verbalize any complaints of pain. A. Support and encouragement provided. R. Safety maintained, will continue to monitor.
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
## 2015-03-01 NOTE — B[facility] Group Notes (Signed)
[facility] LCSW Aftercare Discharge Planning Group Note   03/01/2015 1:31 PM  Participation Quality:  Engaged  Mood/Affect:  Appropriate  Depression Rating:  denies  Anxiety Rating:  2  Thoughts of Suicide:  No Will you contract for safety?   NA  Current AVH:  No  Plan for Discharge/Comments:  C/O poor sleep, but no other complaints.  Is happy with her med dose.  Her mother visited last night, and that went well.  Plans to follow up with Dr Caprice Beaver.  Transportation Means:   Supports:  Roque Lias B

## 2015-03-01 NOTE — Tx Team (Signed)
Interdisciplinary Treatment Plan Update (Adult)  Date:  03/01/2015   Time Reviewed:  9:04 AM   Progress in Treatment: Attending groups: Yes. Participating in groups:  Yes. Taking medication as prescribed:  Yes. Tolerating medication:  Yes. Family/Significant other contact made:  No Patient understands diagnosis:  Yes  As evidenced by seeking help with "my mania" Discussing patient identified problems/goals with staff:  Yes, see initial care plan. Medical problems stabilized or resolved:  Yes. Denies suicidal/homicidal ideation: Yes. Issues/concerns per patient self-inventory:  No. Other:  New problem(s) identified:  Discharge Plan or Barriers: see below  Reason for Continuation of Hospitalization: Mania Medication stabilization  Comments:  Casey Lang is an 35 y.o. married female who was brought to the [facility] voluntarily as a walk-in by her mother due to "increasingly destructive" manic episodes she has been experiencing in the last few months per pt.  Pt sts that examples of her problematic behaviors have been being irritable and arguing with her husband, getting married to her husband after knowing him for only 3 months, drawing a large sum of money from her retirement account and spending it all quickly with a trip and giving the rest away and beginning to cut herself superficially again after not cutting for 2 years. Pt sts that she was having SI yesterday and planned to OD on her prescribed medications. Will continue Seroquel XR 600 mg po , but change to qpm for mood sx/psychosis. Will continue Lamictal to 25 mg po daily and 300 mg po qpm for mood swings/?seizure versus pseudoseizures. Pt reports she has been cleared per neurology. Will continue Klonopin 0.5 mg po tid prn for anxiety sx. Will make available PRN medications as per agitation protocol.  Estimated length of stay: 2-3 days  New goal(s):  Review of initial/current patient goals per problem list:   Review  of initial/current patient goals per problem list:  1. Goal(s): Patient will participate in aftercare plan   Met: Yes   Target date: 3-5 days post admission date   As evidenced by: Patient will participate within aftercare plan AEB aftercare provider and housing plan at discharge being identified. 03/01/15:  Plans to return home with husband, follow up Dr Caprice Beaver    6. Goal (s): Patient will demonstrate decreased signs of mania  * Met: Yes  * Target date: 3-5 days post admission date  * As evidenced by: Patient demonstrate decreased signs of mania AEB decreased mood instability and return to baseline functioning 02/29/16:  Pt states that by the process of coming into the hospital and seeking help, she feels that her symptoms have been addressed and are under control.     Attendees: Patient:  03/01/2015 9:04 AM   Family:   03/01/2015 9:04 AM   Physician:  Ursula Alert, MD 03/01/2015 9:04 AM   Nursing:   Manuella Ghazi, RN 03/01/2015 9:04 AM   CSW:    Roque Lias, LCSW   03/01/2015 9:04 AM   Other:  03/01/2015 9:04 AM   Other:   03/01/2015 9:04 AM   Other:  Lars Pinks, Nurse CM 03/01/2015 9:04 AM   Other:   03/01/2015 9:04 AM   Other:  Norberto Sorenson, [place]  03/01/2015 9:04 AM   Other:  03/01/2015 9:04 AM   Other:  03/01/2015 9:04 AM   Other:  03/01/2015 9:04 AM   Other:  03/01/2015 9:04 AM   Other:  03/01/2015 9:04 AM   Other:   03/01/2015 9:04 AM    Scribe for Treatment  Team:   Trish Mage, 03/01/2015 9:04 AM

## 2015-03-01 NOTE — Progress Notes (Signed)
D:Per patient self inventory form pt reports she slept fair last night. Pt reports a good appetite, normal energy level, good concentration. Pt rates depression 0/10, hopelessness 0/10, anxiety 2/10- all on 0-10 scale, 10 being the worse. Pt denies SI/HI. Denies AVH. Pt denies physical pain. Pt behavior calm and cooperative. Pleasant on approach. Pt reports her goal is "relaxation" and that she will meet her goal by "pray and reflect on positive things." Pt reports she feels good today, pt reports she feels "normal."  A:Special checks q 15 mins in place for safety. Medication administered per MD order (see eMAR) Encouragement and support provided.  R:Pt compliant with medication regimen. Safety maintanied. Will continue to monitor.

## 2015-03-01 NOTE — Progress Notes (Signed)
D: Pt presents flat, depressed, and anxious this evening. Pt is currently thinking about the future of her current marriage. Pt reports some family conflict r/t her current marriage. Pt is hoping to reduce her dependence on Klonopin prior to discharge. Pt is currently denying any SI/HI/AVH.  A: Writer administered prn medications to pt, per MD orders. Continued support and availability as needed was extended to this pt. Staff continues to monitor pt with q31min checks.  R: No adverse drug reactions noted. Pt receptive to treatment. Pt remains safe at this time.

## 2015-03-01 NOTE — Progress Notes (Signed)
[facility] MD Progress Note  03/01/2015 12:50 PM Casey Lang  MRN:  [id number] Subjective: Patient states " I could not sleep all that well last night. I usually take my seroquel early around 6 pm."  Objective:Casey Lang is a 35 y.o. married AA female, employed , lives with her husband in [place] , who has a hx of Bipolar do , presented to the [facility] voluntarily as a walk-in by her mother due to "increasingly destructive" manic episodes which she has been experiencing in the last few months per pt.  Patient seen and chart reviewed today.Discussed patient with treatment team. Pt today seen as less anxious , more calm , reports Lamictal dose increase has been helpful. Pt however with sleep issues last night, reports she wants her seroquel to be given early, however wants to remain on the same dose. Pt denies SI/HI/VH/AH. Per staff - no disruptive issues noted on the unit.     Principal Problem: Bipolar disorder, curr episode mixed, severe, with psychotic features ([place]) Diagnosis:   Patient Active Problem List   Diagnosis Date Noted  . Bipolar disorder, curr episode mixed, severe, with psychotic features ([place]) [F31.64] 02/28/2015  . Suicidal risk [IMO0002] 10/09/2012   Total Time spent with patient: 25 minutes  Past Psychiatric History:Patient was diagnosed with Bipolar disorder at the age of 31. Pt has had several inpatient admissions since then [facility] [id number]) .Patient currently follows up with Dr.Parish Mackinney . Pt has had several suicide attempts , and self injurious behavior in the past. Pt currently has no insurance , due to job change and is currently on patient assistance program.   Past Medical History:  Past Medical History  Diagnosis Date  . Right foot injury 10/09/2012    Resolved without complication  . Mental disorder   . Depression   . Anxiety   . Bipolar 1 disorder [facility])     Past Surgical History  Procedure Laterality Date  . Laparoscopic  gastric sleeve resection  10/09/2012    Surgery was on 09/30/2011  . Foot surgery     Family History:  Family History  Problem Relation Age of Onset  . Hypertension Mother   . Diabetes Mother   . Diabetes Father   . Hypertension Father   . Cancer Other   . Alzheimer's disease Other   . Aneurysm Other   . Anxiety disorder Other   . Depression Other    Family Psychiatric  History: Patient reports that anxiety runs in her mother's side of family and depression on her dad's side. Pt denies suicide /drug abuse /alcoholism in family. Social History: Patient is married , lives with her husband ( married July 2016) . Pt denies having children. Pt reports that her husband has a past hx of legal problems, currently has an upcoming court hearing for driving without license. Pt and her husband ar in marriage counseling at this time. 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 Pt denies legal problems of her own.  History  Alcohol Use No     History  Drug Use No    Social History   Social History  . Marital Status: Single    Spouse Name: N/A  . Number of Children: N/A  . Years of Education: N/A   Social History Main Topics  . Smoking status: Never Smoker   . Smokeless tobacco: Never Used  . Alcohol Use: No  . Drug Use: No  .  Sexual Activity: Not Asked   Other Topics Concern  . None   Social History Narrative   Additional Social History:                         Sleep: Poor  Appetite:  Fair  Current Medications: Current Facility-Administered Medications  Medication Dose Route Frequency Provider Last Rate Last Dose  . acetaminophen (TYLENOL) tablet 650 mg  650 mg Oral Q6H PRN Casey Alert, MD      . alum & mag hydroxide-simeth (MAALOX/MYLANTA) 200-200-20 MG/5ML suspension 30 mL  30 mL Oral Q4H PRN Casey Alert, MD      . clonazePAM (KLONOPIN) tablet 0.5 mg  0.5 mg Oral TID PRN Casey Alert, MD   0.5 mg at  03/01/15 0802  . ferrous sulfate tablet 325 mg  325 mg Oral Q breakfast Casey Edgecombe, MD   325 mg at 03/01/15 0800  . lamoTRIgine (LAMICTAL) tablet 25 mg  25 mg Oral Daily Casey Fosdick, MD   25 mg at 03/01/15 0800  . lamoTRIgine (LAMICTAL) tablet 300 mg  300 mg Oral QPM Casey Cramer, MD   300 mg at 02/28/15 1728  . magnesium hydroxide (MILK OF MAGNESIA) suspension 30 mL  30 mL Oral Daily PRN Casey Alert, MD   30 mL at 03/01/15 0802  . OLANZapine (ZYPREXA) tablet 5 mg  5 mg Oral Q6H PRN Casey Alert, MD       Or  . OLANZapine (ZYPREXA) injection 5 mg  5 mg Intramuscular Q6H PRN Casey Encina, MD      . protein supplement (PREMIER PROTEIN) liquid  2 oz Oral BID BM Casey Alert, MD   2 oz at 03/01/15 0803  . QUEtiapine (SEROQUEL XR) 24 hr tablet 600 mg  600 mg Oral QPC supper Casey Alert, MD        Lab Results:  Results for orders placed or performed during the hospital encounter of 02/28/15 (from the past 48 hour(s))  Lipid panel, fasting     Status: None   Collection Time: 03/01/15  6:20 AM  Result Value Ref Range   Cholesterol 147 0 - 200 mg/dL   Triglycerides 55 <150 mg/dL   HDL 46 >40 mg/dL   Total CHOL/HDL Ratio 3.2 RATIO   VLDL 11 0 - 40 mg/dL   LDL Cholesterol 90 0 - 99 mg/dL    Comment:        Total Cholesterol/HDL:CHD Risk Coronary Heart Disease Risk Table                     Men   Women  1/2 Average Risk   3.4   3.3  Average Risk       5.0   4.4  2 X Average Risk   9.6   7.1  3 X Average Risk  23.4   11.0        Use the calculated Patient Ratio above and the CHD Risk Table to determine the patient's CHD Risk.        ATP III CLASSIFICATION (LDL):  <100     mg/dL   Optimal  100-129  mg/dL   Near or Above                    Optimal  130-159  mg/dL   Borderline  160-189  mg/dL   High  >190     mg/dL   Very High Performed at [facility]  TSH     Status: None   Collection Time: 03/01/15  6:20 AM  Result Value Ref Range   TSH 1.268 0.350 -  4.500 uIU/mL    Comment: Performed at [facility]    Physical Findings: AIMS: Facial and Oral Movements Muscles of Facial Expression: None, normal Lips and Perioral Area: None, normal Jaw: None, normal Tongue: None, normal,Extremity Movements Upper (arms, wrists, hands, fingers): None, normal Lower (legs, knees, ankles, toes): None, normal, Trunk Movements Neck, shoulders, hips: None, normal, Overall Severity Severity of abnormal movements (highest score from questions above): None, normal Incapacitation due to abnormal movements: None, normal Patient's awareness of abnormal movements (rate only patient's report): No Awareness, Dental Status Current problems with teeth and/or dentures?: No Does patient usually wear dentures?: No  CIWA:    COWS:     Musculoskeletal: Strength & Muscle Tone: within normal limits Gait & Station: normal Patient leans: N/A  Psychiatric Specialty Exam: Review of Systems  Psychiatric/Behavioral: Positive for depression. The patient is nervous/anxious and has insomnia.   All other systems reviewed and are negative.   Blood pressure 103/71, pulse 113, temperature 98.9 F (37.2 C), temperature source Oral, resp. rate 12, height 5\' 8"  (1.727 m), weight 111.131 kg (245 lb), last menstrual period 01/31/2015, SpO2 100 %.Body mass index is 37.26 kg/(m^2).  General Appearance: Casual  Eye Contact::  Fair  Speech:  Clear and Coherent  Volume:  Normal  Mood:  Anxious  Affect:  Appropriate  Thought Process:  Goal Directed  Orientation:  Full (Time, Place, and Person)  Thought Content:  Rumination  Suicidal Thoughts:  No  Homicidal Thoughts:  No  Memory:  Immediate;   Fair Recent;   Fair Remote;   Fair  Judgement:  Impaired  Insight:  Fair  Psychomotor Activity:  Normal  Concentration:  Fair  Recall:  [employer] of Knowledge:Fair  Language: Fair  Akathisia:  No  Handed:  Right  AIMS (if indicated):     Assets:  Desire for  Improvement Housing Intimacy Physical Health Social Support Talents/Skills Transportation Vocational/Educational  ADL's:  Intact  Cognition: WNL  Sleep:  Number of Hours: 6.75   Treatment Plan Summary:Patient is a 55 y old AAF, who has a hx of bipolar disorder, currently with several psychosocial stressors , including her marriage , recently becoming aware of her husband's past activities , 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 Patient tolerating medications well. Will continue treatment on the unit. Daily contact with patient to assess and evaluate symptoms and progress in treatment and Medication management Will continue Seroquel XR 600 mg po , but change to qpm for mood sx/psychosis. Will continue Lamictal to 25 mg po daily and 300 mg po qpm for mood swings/?seizure versus pseudoseizures. Pt reports she has been cleared per neurology. Will continue Klonopin 0.5 mg po tid prn for anxiety sx. Will make available PRN medications as per agitation protocol. Will continue to monitor vitals ,medication compliance and treatment side effects while patient is here.  Will monitor for medical issues as well as call consult as needed.  Reviewed labs TSH - wnl , lipid panel- wnl , Hba1c- pending , PL- pending , EKG for qtc - wnl. CSW will start working on disposition. Pt to be referred for CBT/CST . Patient to participate in therapeutic milieu .     Jayan Raymundo MD 03/01/2015, 12:50 PM

## 2015-03-02 DIAGNOSIS — E221 Hyperprolactinemia: Secondary | ICD-10-CM | POA: Clinically undetermined

## 2015-03-02 LAB — PROLACTIN: PROLACTIN: 45.6 ng/mL — AB (ref 4.8–23.3)

## 2015-03-02 MED ORDER — CLONAZEPAM 0.5 MG PO TABS
0.5000 mg | ORAL_TABLET | Freq: Two times a day (BID) | ORAL | Status: DC | PRN
  Administered 2015-03-02 – 2015-03-03 (×2): 0.5 mg via ORAL
  Filled 2015-03-02 (×2): qty 1

## 2015-03-02 NOTE — BHH Group Notes (Signed)
[place] LCSW Group Therapy  03/02/2015 1:15 pm  Type of Therapy: Process Group Therapy  Participation Level:  Active  Participation Quality:  Appropriate  Affect:  Flat  Cognitive:  Oriented  Insight:  Improving  Engagement in Group:  Limited  Engagement in Therapy:  Limited  Modes of Intervention:  Activity, Clarification, Education, Problem-solving and Support  Summary of Progress/Problems: Today's group addressed the issue of overcoming obstacles.  Patients were asked to identify their biggest obstacle post d/c that stands in the way of their on-going success, and then problem solve as to how to manage this. "I feel like my whole family is trying to pull me in different ways.  There is tension between me and my mom and my brother.  I just have to ask them to not visit right now because it only causes more stress.  It's hard to cut family off, but I need to turn this over to God right now.  They really don't understand bipolar d/o, and so they put too much pressure on me.  This is the only way I know how to deal with it.  "I'm parciting saying no."  Casey Lang 03/02/2015   1:28 PM

## 2015-03-02 NOTE — Progress Notes (Signed)
Pt did not attend wrap up group meeting.

## 2015-03-02 NOTE — BHH Group Notes (Signed)
[place] Group Notes:  (Nursing/MHT/Case Management/Adjunct)  Date:  03/02/2015  Time:  11:24 AM  Type of Therapy:  Nurse Education  Participation Level:  Active  Participation Quality:  Appropriate and Attentive  Affect:  Appropriate  Cognitive:  Alert and Appropriate  Insight:  Appropriate, Good and Improving  Engagement in Group:  Engaged and Improving  Modes of Intervention:  Activity, Discussion and Education  Topic was on leisure and lifestyle changes. Discussed the importance of choosing a healthy leisure activities. Group encouraged to surround themselves with positive and healthy group/support system when changing to a healthy lifestyle. Patient was receptive and contributed.  Mart Piggs 03/02/2015, 11:24 AM

## 2015-03-02 NOTE — Progress Notes (Signed)
[facility] MD Progress Note  03/02/2015 12:27 PM Casey Lang  MRN:  [id number] Subjective: Patient states " I am fine . I am trying do this on my own , I am trying to keep my focus on getting better . My mother came to visit me last night, she does not approve my husband . I told her to stay away for now , since I need a break."    Objective:Zenith Lang is a 35 y.o. married AA female, employed , lives with her husband in [place] , who has a hx of Bipolar do , presented to the [facility] voluntarily as a walk-in by her mother due to "increasingly destructive" manic episodes which she has been experiencing in the last few months per pt.  Patient seen and chart reviewed today.Discussed patient with treatment team. Pt today seen pressured , talks at length about her relational stressors , her family and how to get her life back together. Pt tolerating her medications well. Pt denies SI/HI/AH/VH . Per staff - pt is improving , is interactive in milieu.     Principal Problem: Bipolar disorder, curr episode mixed, severe, with psychotic features ([place]) Diagnosis:   Patient Active Problem List   Diagnosis Date Noted  . Hyperprolactinemia ([place]) [E22.1] 03/02/2015  . Bipolar disorder, curr episode mixed, severe, with psychotic features ([place]) [F31.64] 02/28/2015   Total Time spent with patient: 25 minutes  Past Psychiatric History:Patient was diagnosed with Bipolar disorder at the age of 71. Pt has had several inpatient admissions since then [facility] [id number]) .Patient currently follows up with Dr.Parish Mackinney . Pt has had several suicide attempts , and self injurious behavior in the past. Pt currently has no insurance , due to job change and is currently on patient assistance program.   Past Medical History:  Past Medical History  Diagnosis Date  . Right foot injury 10/09/2012    Resolved without complication  . Mental disorder   . Depression   . Anxiety   . Bipolar 1 disorder  [facility])     Past Surgical History  Procedure Laterality Date  . Laparoscopic gastric sleeve resection  10/09/2012    Surgery was on 09/30/2011  . Foot surgery     Family History:  Family History  Problem Relation Age of Onset  . Hypertension Mother   . Diabetes Mother   . Diabetes Father   . Hypertension Father   . Cancer Other   . Alzheimer's disease Other   . Aneurysm Other   . Anxiety disorder Other   . Depression Other    Family Psychiatric  History: Patient reports that anxiety runs in her mother's side of family and depression on her dad's side. Pt denies suicide /drug abuse /alcoholism in family. Social History: Patient is married , lives with her husband ( married July 2016) . Pt denies having children. Pt reports that her husband has a past hx of legal problems, currently has an upcoming court hearing for driving without license. Pt and her husband ar in marriage counseling at this time. 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 Pt denies legal problems of her own.  History  Alcohol Use No     History  Drug Use No    Social History   Social History  . Marital Status: Single    Spouse Name: N/A  . Number of Children: N/A  . Years of Education: N/A   Social History Main  Topics  . Smoking status: Never Smoker   . Smokeless tobacco: Never Used  . Alcohol Use: No  . Drug Use: No  . Sexual Activity: Not Asked   Other Topics Concern  . None   Social History Narrative   Additional Social History:                         Sleep: Fair  Appetite:  Fair  Current Medications: Current Facility-Administered Medications  Medication Dose Route Frequency Provider Last Rate Last Dose  . acetaminophen (TYLENOL) tablet 650 mg  650 mg Oral Q6H PRN Ursula Alert, MD      . alum & mag hydroxide-simeth (MAALOX/MYLANTA) 200-200-20 MG/5ML suspension 30 mL  30 mL Oral Q4H PRN Ursula Alert, MD      . clonazePAM  (KLONOPIN) tablet 0.5 mg  0.5 mg Oral BID PRN Ursula Alert, MD      . ferrous sulfate tablet 325 mg  325 mg Oral Q breakfast Ursula Alert, MD   325 mg at 03/02/15 0816  . lamoTRIgine (LAMICTAL) tablet 25 mg  25 mg Oral Daily Ursula Alert, MD   25 mg at 03/02/15 0816  . lamoTRIgine (LAMICTAL) tablet 300 mg  300 mg Oral QPM Ursula Alert, MD   300 mg at 03/01/15 1837  . magnesium hydroxide (MILK OF MAGNESIA) suspension 30 mL  30 mL Oral Daily PRN Ursula Alert, MD   30 mL at 03/02/15 0818  . OLANZapine (ZYPREXA) tablet 5 mg  5 mg Oral Q6H PRN Ursula Alert, MD       Or  . OLANZapine (ZYPREXA) injection 5 mg  5 mg Intramuscular Q6H PRN Ursula Alert, MD      . protein supplement (PREMIER PROTEIN) liquid  2 oz Oral BID BM Ursula Alert, MD   2 oz at 03/02/15 1000  . QUEtiapine (SEROQUEL XR) 24 hr tablet 600 mg  600 mg Oral QPC supper Ursula Alert, MD   600 mg at 03/01/15 [id number]    Lab Results:  Results for orders placed or performed during the hospital encounter of 02/28/15 (from the past 48 hour(s))  Lipid panel, fasting     Status: None   Collection Time: 03/01/15  6:20 AM  Result Value Ref Range   Cholesterol 147 0 - 200 mg/dL   Triglycerides 55 <150 mg/dL   HDL 46 >40 mg/dL   Total CHOL/HDL Ratio 3.2 RATIO   VLDL 11 0 - 40 mg/dL   LDL Cholesterol 90 0 - 99 mg/dL    Comment:        Total Cholesterol/HDL:CHD Risk Coronary Heart Disease Risk Table                     Men   Women  1/2 Average Risk   3.4   3.3  Average Risk       5.0   4.4  2 X Average Risk   9.6   7.1  3 X Average Risk  23.4   11.0        Use the calculated Patient Ratio above and the CHD Risk Table to determine the patient's CHD Risk.        ATP III CLASSIFICATION (LDL):  <100     mg/dL   Optimal  100-129  mg/dL   Near or Above                    Optimal  130-159  mg/dL  Borderline  160-189  mg/dL   High  >190     mg/dL   Very High Performed at [facility]   TSH     Status: None    Collection Time: 03/01/15  6:20 AM  Result Value Ref Range   TSH 1.268 0.350 - 4.500 uIU/mL    Comment: Performed at [facility]  Prolactin     Status: Abnormal   Collection Time: 03/01/15  6:20 AM  Result Value Ref Range   Prolactin 45.6 (H) 4.8 - 23.3 ng/mL    Comment: (NOTE) Performed At: [facility] [place], [place] [id number] Lindon Romp MD [id number] Performed at [facility]     Physical Findings: AIMS: Facial and Oral Movements Muscles of Facial Expression: None, normal Lips and Perioral Area: None, normal Jaw: None, normal Tongue: None, normal,Extremity Movements Upper (arms, wrists, hands, fingers): None, normal Lower (legs, knees, ankles, toes): None, normal, Trunk Movements Neck, shoulders, hips: None, normal, Overall Severity Severity of abnormal movements (highest score from questions above): None, normal Incapacitation due to abnormal movements: None, normal Patient's awareness of abnormal movements (rate only patient's report): No Awareness, Dental Status Current problems with teeth and/or dentures?: No Does patient usually wear dentures?: No  CIWA:    COWS:     Musculoskeletal: Strength & Muscle Tone: within normal limits Gait & Station: normal Patient leans: N/A  Psychiatric Specialty Exam: Review of Systems  Psychiatric/Behavioral: Positive for depression. The patient is nervous/anxious.   All other systems reviewed and are negative.   Blood pressure 104/73, pulse 104, temperature 98.9 F (37.2 C), temperature source Oral, resp. rate 16, height 5\' 8"  (1.727 m), weight 111.131 kg (245 lb), last menstrual period 01/31/2015, SpO2 100 %.Body mass index is 37.26 kg/(m^2).  General Appearance: Casual  Eye Contact::  Fair  Speech:  Clear and Coherent  Volume:  Normal  Mood:  Anxious  Affect:  Appropriate  Thought Process:  Goal Directed  Orientation:  Full (Time, Place, and Person)   Thought Content:  Rumination  Suicidal Thoughts:  No  Homicidal Thoughts:  No  Memory:  Immediate;   Fair Recent;   Fair Remote;   Fair  Judgement:  Impaired  Insight:  Fair  Psychomotor Activity:  Normal  Concentration:  Fair  Recall:  [employer] of Knowledge:Fair  Language: Fair  Akathisia:  No  Handed:  Right  AIMS (if indicated):     Assets:  Desire for Improvement Housing Intimacy Physical Health Social Support Talents/Skills Transportation Vocational/Educational  ADL's:  Intact  Cognition: WNL  Sleep:  Number of Hours: 7.25   Treatment Plan Summary:Patient is a 45 y old AAF, who has a hx of bipolar disorder, currently with several psychosocial stressors , including her marriage , recently becoming aware of her husband's past activities , 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 Patient tolerating medications well. Will continue treatment on the unit. Daily contact with patient to assess and evaluate symptoms and progress in treatment and Medication management Will continue Seroquel XR 600 mg po qpm for mood sx/psychosis. Will continue Lamictal  25 mg po daily and 300 mg po qpm for mood swings/?seizure versus pseudoseizures. Pt reports she has been cleared per neurology. Will continue Klonopin 0.5 mg po , change to bid  prn for anxiety sx. Will make available PRN medications as per agitation protocol. Will continue to monitor vitals ,medication compliance and treatment side effects while  patient is here.  Will monitor for medical issues as well as call consult as needed.  Reviewed labs TSH - wnl , lipid panel- wnl , Hba1c- pending , PL- 45.6 - will need to monitor on an out pt basis  , EKG for qtc - wnl. CSW will start working on disposition. Pt to be referred for CBT/CST . Patient to participate in therapeutic milieu .     Raylynne Cubbage MD 03/02/2015, 12:27 PM

## 2015-03-02 NOTE — Progress Notes (Signed)
DAR NOTE: Patient presents with anxious affect and depressed mood.  Denies pain, auditory and visual hallucinations.  Rates depression at 2, hopelessness at 2, and anxiety at 2.  Describe energy level as normal and concentration as good.  Maintained on routine safety checks.  Medications given as prescribed.  Support and encouragement offered as needed.  Attended group and participated.  States goal for today is "happiness and peaceful positive thoughts."  Patient observed socializing with peers in the dayroom.  Patient requested and received Klonopin for complain of anxiety with good effect.

## 2015-03-03 LAB — HEMOGLOBIN A1C
HEMOGLOBIN A1C: 5.1 % (ref 4.8–5.6)
MEAN PLASMA GLUCOSE: 100 mg/dL

## 2015-03-03 MED ORDER — LAMOTRIGINE 150 MG PO TABS: 300.0000 mg | ORAL_TABLET | Freq: Every evening | ORAL | Status: DC

## 2015-03-03 MED ORDER — QUETIAPINE FUMARATE ER 300 MG PO TB24: 600.0000 mg | ORAL_TABLET | Freq: Every day | ORAL | Status: DC

## 2015-03-03 MED ORDER — LAMOTRIGINE 25 MG PO TABS: 25.0000 mg | ORAL_TABLET | Freq: Every day | ORAL | Status: DC

## 2015-03-03 NOTE — Progress Notes (Signed)
D: Pt is alert and oriented x4 however, was isolative and withdrawn to room. Pt endorses some sadness; she states, "I was expecting my husband today but he did not come, I'm a little sad about that." Pt denies SI/HI, pain and AVH, and anxiety. She states, "I can feel the difference in me." Pt remained nonviolent, calm and cooperative through the shift assessment. A: Support, encouragement, and safe environment provided.  15-minute safety checks continue. R: Pt did not attend group. Safety checks continue

## 2015-03-03 NOTE — Discharge Summary (Signed)
Physician Discharge Summary Note  Patient:  Casey Lang is an 35 y.o., female MRN:  [id number] DOB:  12-15-1980 Patient phone:  [phone] (home)  Patient address:   [place] [address],  Total Time spent with patient: 45 minutes  Date of Admission:  02/28/2015 Date of Discharge: 03/03/2015  Reason for Admission:  depression  Principal Problem: Bipolar disorder, curr episode mixed, severe, with psychotic features [facility]) Discharge Diagnoses: Patient Active Problem List   Diagnosis Date Noted  . Hyperprolactinemia ([place]) [E22.1] 03/02/2015  . Bipolar disorder, curr episode mixed, severe, with psychotic features [facility]) [F31.64] 02/28/2015    Past Psychiatric History:  See above noted  Past Medical History:  Past Medical History  Diagnosis Date  . Right foot injury 10/09/2012    Resolved without complication  . Mental disorder   . Depression   . Anxiety   . Bipolar 1 disorder [facility])     Past Surgical History  Procedure Laterality Date  . Laparoscopic gastric sleeve resection  10/09/2012    Surgery was on 09/30/2011  . Foot surgery     Family History:  Family History  Problem Relation Age of Onset  . Hypertension Mother   . Diabetes Mother   . Diabetes Father   . Hypertension Father   . Cancer Other   . Alzheimer's disease Other   . Aneurysm Other   . Anxiety disorder Other   . Depression Other    Family Psychiatric  History:  See above noted Social History:  History  Alcohol Use No     History  Drug Use No    Social History   Social History  . Marital Status: Single    Spouse Name: N/A  . Number of Children: N/A  . Years of Education: N/A   Social History Main Topics  . Smoking status: Never Smoker   . Smokeless tobacco: Never Used  . Alcohol Use: No  . Drug Use: No  . Sexual Activity: Not Asked   Other Topics Concern  . None   Social History Narrative    Hospital Course: Casey Lang is a 35 y.o.female  with a hx of Bipolar do, initially presented to the [facility] voluntarily as a walk-in by her mother due to "increasingly destructive" manic episodes she has been experiencing in the last few months per pt. Husband who states that the patient has had many manic episodes over the last few months during which she has felt suicidal, homicidal and drawn to self harming activities from her past like superficial cutting.  Husband described patient to go through extreme mood swings, cutting behaviors and unusual behavior like excess spending and binge eating.   Casey Lang was admitted for Bipolar disorder, curr episode mixed, severe, with psychotic features ([place]) and crisis management.  She was treated with the following medications, Seroquel XR 600 mg for mood sx/psychosis, Lamictal for mood swings/?seizure versus pseudoseizures.  Pt did report she has been cleared per neurology and Klonopin 0.5 mg for anxiety sx.  Casey Lang was discharged with current medication and was instructed on how to take medications as prescribed; (details listed below under Medication List).  Medical problems were identified and treated as needed.  Home medications were restarted as appropriate.  Improvement was monitored by observation and Casey Lang daily report of symptom reduction.  Emotional and mental status was monitored by daily self-inventory reports completed by Casey Lang and clinical staff.         Casey Misty  Lang was evaluated by the treatment team for stability and plans for continued recovery upon discharge.  Casey Lang motivation was an integral factor for scheduling further treatment.  Employment, transportation, bed availability, health status, family support, and any pending legal issues were also considered during her hospital stay.  She was offered further treatment options upon discharge including but not limited to Residential, Intensive Outpatient, and Outpatient  treatment.  Casey Lang will follow up with the services as listed below under Follow Up Information.     Upon completion of this admission the [place] was both mentally and medically stable for discharge denying suicidal/homicidal ideation, auditory/visual/tactile hallucinations, delusional thoughts and paranoia.     Physical Findings: AIMS: Facial and Oral Movements Muscles of Facial Expression: None, normal Lips and Perioral Area: None, normal Jaw: None, normal Tongue: None, normal,Extremity Movements Upper (arms, wrists, hands, fingers): None, normal Lower (legs, knees, ankles, toes): None, normal, Trunk Movements Neck, shoulders, hips: None, normal, Overall Severity Severity of abnormal movements (highest score from questions above): None, normal Incapacitation due to abnormal movements: None, normal Patient's awareness of abnormal movements (rate only patient's report): No Awareness, Dental Status Current problems with teeth and/or dentures?: No Does patient usually wear dentures?: No  CIWA:    COWS:     Musculoskeletal: Strength & Muscle Tone: within normal limits Gait & Station: normal Patient leans: N/A  Psychiatric Specialty Exam:  See MD SRA Review of Systems  All other systems reviewed and are negative.   Blood pressure 108/76, pulse 109, temperature 98.8 F (37.1 C), temperature source Oral, resp. rate 20, height 5\' 8"  (1.727 m), weight 111.131 kg (245 lb), last menstrual period 01/31/2015, SpO2 100 %.Body mass index is 37.26 kg/(m^2).  Have you used any form of tobacco in the last 30 days? (Cigarettes, Smokeless Tobacco, Cigars, and/or Pipes): No  Has this patient used any form of tobacco in the last 30 days? (Cigarettes, Smokeless Tobacco, Cigars, and/or Pipes) Yes, offered  Metabolic Disorder Labs:  No results found for: HGBA1C, MPG Lab Results  Component Value Date   PROLACTIN 45.6* 03/01/2015   Lab Results  Component Value Date   CHOL 147  03/01/2015   TRIG 55 03/01/2015   HDL 46 03/01/2015   CHOLHDL 3.2 03/01/2015   VLDL 11 03/01/2015   [address] 90 03/01/2015    See Psychiatric Specialty Exam and Suicide Risk Assessment completed by Attending Physician prior to discharge.  Discharge destination:  Home  Is patient on multiple antipsychotic therapies at discharge:  No   Has Patient had three or more failed trials of antipsychotic monotherapy by history:  No  Recommended Plan for Multiple Antipsychotic Therapies: NA     Medication List    STOP taking these medications        ACTIGALL 300 MG capsule  Generic drug:  ursodiol     calcium citrate 950 MG tablet  Commonly known as:  CALCITRATE - dosed in mg elemental calcium     Calcium Citrate Powd     cholecalciferol 1000 units tablet  Commonly known as:  VITAMIN D     [employer] BIOTIN PO     D 1000 1000 units capsule  Generic drug:  Cholecalciferol     DULoxetine 20 MG capsule  Commonly known as:  CYMBALTA     ferrous sulfate 325 (65 FE) MG tablet     FISH OIL PO     LORazepam 2 MG tablet  Commonly known as:  ATIVAN  MULTI-VITAMINS Tabs     multivitamin with minerals Tabs tablet     Omega-3 1000 MG Caps      TAKE these medications      Indication   lamoTRIgine 25 MG tablet  Commonly known as:  LAMICTAL  Take 1 tablet (25 mg total) by mouth daily.   Indication:  mood stabilization     lamoTRIgine 150 MG tablet  Commonly known as:  LAMICTAL  Take 2 tablets (300 mg total) by mouth every evening.   Indication:  mood stabilization     QUEtiapine 300 MG 24 hr tablet  Commonly known as:  SEROQUEL XR  Take 2 tablets (600 mg total) by mouth daily after supper.   Indication:  Manic-Depression       Follow-up Information    Follow up with Lessie Dings. Go on 03/09/2015.   Why:  @10 :15a for med managment    Contact information:   [address] [place], [place] [address] [phone]      Follow-up recommendations:  Activity:  as  tol Diet:  as tol  Comments:  1.  Take all your medications as prescribed.              2.  Report any adverse side effects to outpatient provider.   PL- 45.6 - will need to monitor on an out pt basis                       3.  Patient instructed to not use alcohol or illegal drugs while on prescription medicines.            4.  In the event of worsening symptoms, instructed patient to call 911, the crisis hotline or go to nearest emergency room for evaluation of symptoms.  Signed: Freda Munro May Emilo Gras AGNP-BC 03/03/2015, 9:34 AM

## 2015-03-03 NOTE — Tx Team (Signed)
Interdisciplinary Treatment Plan Update (Adult)  Date:  03/03/2015   Time Reviewed:  1:27 PM   Progress in Treatment: Attending groups: Yes. Participating in groups:  Yes. Taking medication as prescribed:  Yes. Tolerating medication:  Yes. Family/Significant other contact made:  Yes Patient understands diagnosis:  Yes  As evidenced by seeking help with "my mania" Discussing patient identified problems/goals with staff:  Yes, see initial care plan. Medical problems stabilized or resolved:  Yes. Denies suicidal/homicidal ideation: Yes. Issues/concerns per patient self-inventory:  No. Other:  New problem(s) identified:  Discharge Plan or Barriers: see below  Reason for Continuation of Hospitalization:   Comments:  Casey Lang is an 35 y.o. married female who was brought to the [facility] voluntarily as a walk-in by her mother due to "increasingly destructive" manic episodes she has been experiencing in the last few months per pt.  Pt sts that examples of her problematic behaviors have been being irritable and arguing with her husband, getting married to her husband after knowing him for only 3 months, drawing a large sum of money from her retirement account and spending it all quickly with a trip and giving the rest away and beginning to cut herself superficially again after not cutting for 2 years. Pt sts that she was having SI yesterday and planned to OD on her prescribed medications. Will continue Seroquel XR 600 mg po , but change to qpm for mood sx/psychosis. Will continue Lamictal to 25 mg po daily and 300 mg po qpm for mood swings/?seizure versus pseudoseizures. Pt reports she has been cleared per neurology. Will continue Klonopin 0.5 mg po tid prn for anxiety sx. Will make available PRN medications as per agitation protocol.  Estimated length of stay: D/C today  New goal(s):  Review of initial/current patient goals per problem list:   Review of initial/current patient  goals per problem list:  1. Goal(s): Patient will participate in aftercare plan   Met: Yes   Target date: 3-5 days post admission date   As evidenced by: Patient will participate within aftercare plan AEB aftercare provider and housing plan at discharge being identified. 03/01/15:  Plans to return home with husband, follow up Dr Caprice Beaver    6. Goal (s): Patient will demonstrate decreased signs of mania  * Met: Yes  * Target date: 3-5 days post admission date  * As evidenced by: Patient demonstrate decreased signs of mania AEB decreased mood instability and return to baseline functioning 02/29/16:  Pt states that by the process of coming into the hospital and seeking help, she feels that her symptoms have been addressed and are under control.     Attendees: Patient:  03/03/2015 1:27 PM   Family:   03/03/2015 1:27 PM   Physician:  Ursula Alert, MD 03/03/2015 1:27 PM   Nursing:   Hedy Jacob, RN 03/03/2015 1:27 PM   CSW:    Roque Lias, LCSW   03/03/2015 1:27 PM   Other:  03/03/2015 1:27 PM   Other:   03/03/2015 1:27 PM   Other:  Lars Pinks, Nurse CM 03/03/2015 1:27 PM   Other:   03/03/2015 1:27 PM   Other:  Norberto Sorenson, [place]  03/03/2015 1:27 PM   Other:  03/03/2015 1:27 PM   Other:  03/03/2015 1:27 PM   Other:  03/03/2015 1:27 PM   Other:  03/03/2015 1:27 PM   Other:  03/03/2015 1:27 PM   Other:   03/03/2015 1:27 PM    Scribe for Treatment Team:  Trish Mage, 03/03/2015 1:27 PM

## 2015-03-03 NOTE — BHH Suicide Risk Assessment (Signed)
[place] INPATIENT:  Family/Significant Other Suicide Prevention Education  Suicide Prevention Education:  Education Completed; Raymon Mutton, husband, [phone]  has been identified by the patient as the family member/significant other with whom the patient will be residing, and identified as the person(s) who will aid the patient in the event of a mental health crisis (suicidal ideations/suicide attempt).  With written consent from the patient, the family member/significant other has been provided the following suicide prevention education, prior to the and/or following the discharge of the patient.  The suicide prevention education provided includes the following:  Suicide risk factors  Suicide prevention and interventions  National Suicide Hotline telephone number  [facility] assessment telephone number  [facility] Emergency Assistance [place] and/or Residential Mobile Crisis Unit telephone number  Request made of family/significant other to:  Remove weapons (e.g., guns, rifles, knives), all items previously/currently identified as safety concern.    Remove drugs/medications (over-the-counter, prescriptions, illicit drugs), all items previously/currently identified as a safety concern.  The family member/significant other verbalizes understanding of the suicide prevention education information provided.  The family member/significant other agrees to remove the items of safety concern listed above.  Roque Lias B 03/03/2015, 1:26 PM

## 2015-03-03 NOTE — Progress Notes (Signed)
Recreation Therapy Notes  01.20.2017 Approximately 8:15am LRT met with patient to follow up on breathing technique introduced previously. Patient reported she has been practicing technique independently and additionally discussed technique with roommate and taught her how to practice it as well. Patient asked if this technique would help with her anger, LRT confirmed. LRT then introduced patient to simple deep breathing (in for a count of 4, out for a count of 8) and counting to 50. Technique is intended to help patient calm down before reacting in anger. Patient practiced with LRT and stated she felt relaxed as a result. Patient verbalized she would be able to use independently post d/c. LRT encouraged patient to continue work post d/c and to discuss techniques learned during admission with OPT therapist. Patient agreeable.   Patient reports anticipated d/c for today at 1:00pm.   Lane Hacker, LRT/CTRS   Lane Hacker 03/03/2015 8:53 AM

## 2015-03-03 NOTE — Progress Notes (Signed)
  [facility] Adult Case Management Discharge Plan :  Will you be returning to the same living situation after discharge:  Yes,  home At discharge, do you have transportation home?: Yes,  husband Do you have the ability to pay for your medications: Yes,  insurance  Release of information consent forms completed and in the chart;  Patient's signature needed at discharge.  Patient to Follow up at: Follow-up Information    Follow up with Lessie Dings. Go on 03/09/2015.   Why:  @10 :15a for med managment    Contact information:   [address] [place], [place] [address] [phone]      Next level of care provider has access to [place] and Suicide Prevention discussed: Yes,  yes  Have you used any form of tobacco in the last 30 days? (Cigarettes, Smokeless Tobacco, Cigars, and/or Pipes): No  Has patient been referred to the Quitline?: N/A patient is not a smoker  Patient has been referred for addiction treatment: N/A  Trish Mage 03/03/2015, 1:29 PM

## 2015-03-03 NOTE — BHH Suicide Risk Assessment (Signed)
[facility] Discharge Suicide Risk Assessment   Principal Problem: Bipolar disorder, curr episode mixed, severe, with psychotic features [facility]) Discharge Diagnoses:  Patient Active Problem List   Diagnosis Date Noted  . Hyperprolactinemia ([place]) [E22.1] 03/02/2015  . Bipolar disorder, curr episode mixed, severe, with psychotic features ([place]) [F31.64] 02/28/2015    Total Time spent with patient: 30 minutes  Musculoskeletal: Strength & Muscle Tone: within normal limits Gait & Station: normal Patient leans: N/A  Psychiatric Specialty Exam: Review of Systems  Psychiatric/Behavioral: Negative for depression.  All other systems reviewed and are negative.   Blood pressure 108/76, pulse 109, temperature 98.8 F (37.1 C), temperature source Oral, resp. rate 20, height 5\' 8"  (1.727 m), weight 111.131 kg (245 lb), last menstrual period 01/31/2015, SpO2 100 %.Body mass index is 37.26 kg/(m^2).  General Appearance: Casual  Eye Contact::  Fair  Speech:  Clear and [id number]  Volume:  Normal  Mood:  Euthymic  Affect:  Appropriate  Thought Process:  Coherent  Orientation:  Full (Time, Place, and Person)  Thought Content:  WDL  Suicidal Thoughts:  No  Homicidal Thoughts:  No  Memory:  Immediate;   Fair Recent;   Fair Remote;   Fair  Judgement:  Fair  Insight:  Fair  Psychomotor Activity:  Normal  Concentration:  Fair  Recall:  [employer] of Knowledge:Fair  Language: Fair  Akathisia:  No  Handed:  Right  AIMS (if indicated):     Assets:  Desire for Improvement  Sleep:  Number of Hours: 6.5  Cognition: WNL  ADL's:  Intact   Mental Status Per Nursing Assessment::   On Admission:     Demographic Factors:  female,married  Loss Factors: NA  Historical Factors: Impulsivity  Risk Reduction Factors:   Positive social support  Continued Clinical Symptoms:  Previous Psychiatric Diagnoses and Treatments  Cognitive Features That Contribute To Risk:  None    Suicide Risk:   Minimal: No identifiable suicidal ideation.  Patients presenting with no risk factors but with morbid ruminations; may be classified as minimal risk based on the severity of the depressive symptoms  Follow-up Information    Follow up with Lessie Dings. Go on 03/09/2015.   Why:  @10 :15a for med managment    Contact information:   [address] [place], [place] [address] [phone]      Plan Of Care/Follow-up recommendations:  Activity:  No restrictions Diet:  regular Tests:  PL level needs to be monitored on an out patient basis. Other:  None  Annita Ratliff, MD 03/03/2015, 9:24 AM

## 2015-03-03 NOTE — Progress Notes (Signed)
Patient discharged home with prescriptions. Patient was stable and appreciative at that time. All papers and prescriptions were given and valuables returned. Verbal understanding expressed. Denies SI/HI and A/VH. Patient given opportunity to express concerns and ask questions.

## 2015-03-21 ENCOUNTER — Ambulatory Visit (HOSPITAL_COMMUNITY)
Admission: RE | Admit: 2015-03-21 | Discharge: 2015-03-21 | Disposition: A | Discharge status: Home / Self Care | Payer: BLUE CROSS/BLUE SHIELD | Source: Home / Self Care | Attending: Psychiatry | Admitting: Psychiatry

## 2015-03-21 ENCOUNTER — Encounter (HOSPITAL_COMMUNITY): Payer: Self-pay

## 2015-03-21 ENCOUNTER — Observation Stay (HOSPITAL_COMMUNITY)
Admission: RE | Admit: 2015-03-21 | Discharge: 2015-03-22 | Disposition: A | Discharge status: Home / Self Care | Payer: BLUE CROSS/BLUE SHIELD | Attending: Psychiatry | Admitting: Psychiatry

## 2015-03-21 DIAGNOSIS — Z885 Allergy status to narcotic agent status: Secondary | ICD-10-CM | POA: Insufficient documentation

## 2015-03-21 DIAGNOSIS — F3181 Bipolar II disorder: Principal | ICD-10-CM | POA: Diagnosis present

## 2015-03-21 DIAGNOSIS — F3110 Bipolar disorder, current episode manic without psychotic features, unspecified: Secondary | ICD-10-CM | POA: Diagnosis present

## 2015-03-21 DIAGNOSIS — E221 Hyperprolactinemia: Secondary | ICD-10-CM | POA: Insufficient documentation

## 2015-03-21 DIAGNOSIS — G4700 Insomnia, unspecified: Secondary | ICD-10-CM | POA: Insufficient documentation

## 2015-03-21 DIAGNOSIS — Z9884 Bariatric surgery status: Secondary | ICD-10-CM | POA: Insufficient documentation

## 2015-03-21 DIAGNOSIS — F4310 Post-traumatic stress disorder, unspecified: Secondary | ICD-10-CM | POA: Diagnosis present

## 2015-03-21 DIAGNOSIS — F411 Generalized anxiety disorder: Secondary | ICD-10-CM | POA: Diagnosis present

## 2015-03-21 MED ORDER — LAMOTRIGINE 100 MG PO TABS
200.0000 mg | ORAL_TABLET | Freq: Every evening | ORAL | Status: DC
  Administered 2015-03-21: 200 mg via ORAL
  Filled 2015-03-21: qty 2

## 2015-03-21 MED ORDER — LAMOTRIGINE 25 MG PO TABS: 25.0000 mg | ORAL_TABLET | Freq: Every day | ORAL | Status: DC

## 2015-03-21 MED ORDER — ACETAMINOPHEN 325 MG PO TABS: 650.0000 mg | ORAL_TABLET | Freq: Four times a day (QID) | ORAL | Status: DC | PRN

## 2015-03-21 MED ORDER — QUETIAPINE FUMARATE ER 300 MG PO TB24
600.0000 mg | ORAL_TABLET | Freq: Every day | ORAL | Status: DC
  Administered 2015-03-21: 600 mg via ORAL
  Filled 2015-03-21: qty 2

## 2015-03-21 MED ORDER — MAGNESIUM HYDROXIDE 400 MG/5ML PO SUSP: 30.0000 mL | Freq: Every day | ORAL | Status: DC | PRN

## 2015-03-21 MED ORDER — CLONAZEPAM 0.5 MG PO TABS
1.0000 mg | ORAL_TABLET | Freq: Once | ORAL | Status: AC
  Administered 2015-03-21: 1 mg via ORAL
  Filled 2015-03-21: qty 2

## 2015-03-21 MED ORDER — QUETIAPINE FUMARATE ER 300 MG PO TB24: 600.0000 mg | ORAL_TABLET | Freq: Every day | ORAL | Status: DC

## 2015-03-21 MED ORDER — LAMOTRIGINE 25 MG PO TABS
25.0000 mg | ORAL_TABLET | Freq: Every evening | ORAL | Status: DC
  Administered 2015-03-21: 25 mg via ORAL
  Filled 2015-03-21: qty 1

## 2015-03-21 MED ORDER — HYDROXYZINE HCL 25 MG PO TABS
25.0000 mg | ORAL_TABLET | Freq: Four times a day (QID) | ORAL | Status: DC | PRN
  Administered 2015-03-21: 25 mg via ORAL
  Filled 2015-03-21: qty 1

## 2015-03-21 MED ORDER — TRAZODONE HCL 50 MG PO TABS
50.0000 mg | ORAL_TABLET | Freq: Every evening | ORAL | Status: DC | PRN
  Administered 2015-03-21: 50 mg via ORAL
  Filled 2015-03-21: qty 1

## 2015-03-21 MED ORDER — ALUM & MAG HYDROXIDE-SIMETH 200-200-20 MG/5ML PO SUSP: 30.0000 mL | ORAL | Status: DC | PRN
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
## 2015-03-21 NOTE — Progress Notes (Signed)
Pt continuously observed during shift

## 2015-03-21 NOTE — BHH Counselor (Signed)
Pt came to [facility] as a Walk-In seeking a prescription for her medications.  Pt sts that she threw her medications out yesterday and today realized that she wants them.  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 Pt sts that she is not having SI, HI, SHI or AVH.  Pt stated "I don't need to be back in the hospital, I just need more meds."  Pt sts she has an established relationship with a psychiatrist, Letta Moynahan, and sts she last saw her NP for her prescriptions.  Pt sts that she will go there today to see Dr. Caprice Beaver or the NP for new prescriptions.  Pt decided to leave and not complete the assessment.   Faylene Kurtz, MS, CRC, [place] Triage Specialist [facility]
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
## 2015-03-22 DIAGNOSIS — F3110 Bipolar disorder, current episode manic without psychotic features, unspecified: Secondary | ICD-10-CM | POA: Diagnosis present

## 2015-03-22 MED ORDER — HYDROXYZINE HCL 25 MG PO TABS
25.0000 mg | ORAL_TABLET | Freq: Four times a day (QID) | ORAL | Status: DC | PRN
Start: 2015-03-22 — End: 2016-05-03

## 2015-03-22 MED ORDER — LAMOTRIGINE 25 MG PO TABS: 25.0000 mg | ORAL_TABLET | Freq: Every day | ORAL | Status: DC

## 2015-03-22 MED ORDER — QUETIAPINE FUMARATE 400 MG PO TABS: 600.0000 mg | ORAL_TABLET | Freq: Every day | ORAL | Status: DC

## 2015-03-22 MED ORDER — TRAZODONE HCL 50 MG PO TABS: 50.0000 mg | ORAL_TABLET | Freq: Every evening | ORAL | Status: DC | PRN

## 2015-03-22 MED ORDER — LAMOTRIGINE 150 MG PO TABS: 300.0000 mg | ORAL_TABLET | Freq: Every evening | ORAL | Status: DC
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
## 2015-03-22 NOTE — Discharge Instructions (Signed)
Patient will follow up with her primary provider Dr. Letta Moynahan for medication management and therapy.

## 2015-03-22 NOTE — BH Assessment (Signed)
[facility] Assessment Progress Note Patient will follow up with her primary provider Dr. Letta Moynahan for medication management and therapy. This Probation officer and patient discussed medication management and as was given additional information on available mediation management groups in the area. Patient received one on one counseling in reference to medication management and explored different treatment interventions that might benefit her in the future. Patient was discharged with medications and will follow up with her provider on discharge.

## 2015-03-22 NOTE — Progress Notes (Signed)
Pt discharged home. DC instructions provided and explained. Medications reviewed. Rx given. All questions answered. Denies SI, HI, AVH. Pt continuously observed except during bathroom usage. Pt stable at discharge.

## 2015-03-22 NOTE — Progress Notes (Signed)
Pt had an earlier episode of distress and was somewhat tearful but said she was just feeling relieved that the PA was starting her back on her medications   She is presently resting in bed with no complaints or distress noted

## 2015-05-17 ENCOUNTER — Other Ambulatory Visit (HOSPITAL_COMMUNITY): Payer: Self-pay | Admitting: Psychiatry

## 2016-01-10 LAB — OB RESULTS CONSOLE HGB/HCT, BLOOD
HEMATOCRIT: 32 %
HEMOGLOBIN: 11 g/dL

## 2016-01-10 LAB — OB RESULTS CONSOLE RPR: RPR: NONREACTIVE

## 2016-01-10 LAB — OB RESULTS CONSOLE ABO/RH: RH TYPE: POSITIVE

## 2016-01-10 LAB — OB RESULTS CONSOLE PLATELET COUNT: Platelets: 264 10*3/uL

## 2016-01-10 LAB — OB RESULTS CONSOLE ANTIBODY SCREEN: ANTIBODY SCREEN: NEGATIVE

## 2016-01-10 LAB — OB RESULTS CONSOLE HIV ANTIBODY (ROUTINE TESTING): HIV: NONREACTIVE

## 2016-01-10 LAB — OB RESULTS CONSOLE HEPATITIS B SURFACE ANTIGEN: HEP B S AG: NEGATIVE

## 2016-01-10 LAB — OB RESULTS CONSOLE GC/CHLAMYDIA
Chlamydia: NEGATIVE
GC PROBE AMP, GENITAL: NEGATIVE

## 2016-01-10 LAB — OB RESULTS CONSOLE RUBELLA ANTIBODY, IGM: RUBELLA: IMMUNE

## 2016-01-15 LAB — CYSTIC FIBROSIS DIAGNOSTIC STUDY: Interpretation-CFDNA:: NEGATIVE

## 2016-02-12 NOTE — L&D Delivery Note (Signed)
36 y.o. G2P0010 at [redacted]w[redacted]d delivered a viable female infant in cephalic, OA position, body precipitously delivered after head during 1 push when head was at perineum. No nuchal cord. Anterior shoulder delivered with ease. 60 sec delayed cord clamping. Cord clamped x2 and cut. Placenta delivered spontaneously intact, with 3VC. Fundus firm on exam with massage and pitocin. Good hemostasis noted.  Anesthesia: Epidural; local anesthesia for repair Laceration: 2nd degree perineal with right sulcal tear;  During repair, a hematoma was developing on the left vaginal side wall. After the repair, it was noted a 8cmx10cm hematoma, stable. Suture: 3-0 Vicryl x2; 2-0 Monocryl Good hemostasis noted. EBL: 500cc (<100cc of uterine bleeding, 400cc of laceration bleeding including hematoma)  Mom and baby recovering in LDR.    Apgars: APGAR (1 MIN): 9   APGAR (5 MINS): 9    Weight: Pending skin to skin  Sponge and instrument count were correct x2. Placenta sent to L&D.  Dr. Elonda Husky present to evaluate hematoma, agreed is stable and to not manipulate further.  Katherine Basset, DO [facility] [employer] [facility], [place] 07/18/2016, 5:07 AM

## 2016-04-16 ENCOUNTER — Encounter: Payer: Self-pay | Admitting: *Deleted

## 2016-04-25 ENCOUNTER — Other Ambulatory Visit (HOSPITAL_COMMUNITY)
Admission: RE | Admit: 2016-04-25 | Discharge: 2016-04-25 | Disposition: A | Discharge status: Home / Self Care | Payer: Medicaid Other | Source: Ambulatory Visit | Attending: Certified Nurse Midwife | Admitting: Certified Nurse Midwife

## 2016-04-25 ENCOUNTER — Ambulatory Visit (INDEPENDENT_AMBULATORY_CARE_PROVIDER_SITE_OTHER): Payer: Medicaid Other | Admitting: Certified Nurse Midwife

## 2016-04-25 ENCOUNTER — Encounter: Payer: Self-pay | Admitting: Certified Nurse Midwife

## 2016-04-25 VITALS — BP 123/85 | HR 101 | Temp 97.6°F | Wt 262.8 lb

## 2016-04-25 DIAGNOSIS — O09522 Supervision of elderly multigravida, second trimester: Secondary | ICD-10-CM

## 2016-04-25 DIAGNOSIS — O99352 Diseases of the nervous system complicating pregnancy, second trimester: Secondary | ICD-10-CM

## 2016-04-25 DIAGNOSIS — O0990 Supervision of high risk pregnancy, unspecified, unspecified trimester: Secondary | ICD-10-CM | POA: Insufficient documentation

## 2016-04-25 DIAGNOSIS — R569 Unspecified convulsions: Secondary | ICD-10-CM

## 2016-04-25 DIAGNOSIS — Z3A00 Weeks of gestation of pregnancy not specified: Secondary | ICD-10-CM | POA: Insufficient documentation

## 2016-04-25 DIAGNOSIS — F3164 Bipolar disorder, current episode mixed, severe, with psychotic features: Secondary | ICD-10-CM | POA: Diagnosis not present

## 2016-04-25 DIAGNOSIS — O99342 Other mental disorders complicating pregnancy, second trimester: Secondary | ICD-10-CM

## 2016-04-25 DIAGNOSIS — O09529 Supervision of elderly multigravida, unspecified trimester: Secondary | ICD-10-CM | POA: Insufficient documentation

## 2016-04-25 DIAGNOSIS — E221 Hyperprolactinemia: Secondary | ICD-10-CM

## 2016-04-25 DIAGNOSIS — Z3401 Encounter for supervision of normal first pregnancy, first trimester: Secondary | ICD-10-CM

## 2016-04-25 DIAGNOSIS — F445 Conversion disorder with seizures or convulsions: Secondary | ICD-10-CM | POA: Insufficient documentation

## 2016-04-25 LAB — OB RESULTS CONSOLE GBS: GBS: POSITIVE

## 2016-04-25 MED ORDER — FOLIC ACID 1 MG PO TABS: 4.0000 mg | ORAL_TABLET | Freq: Every day | ORAL | 5 refills | Status: DC

## 2016-04-25 MED ORDER — PRENATE PIXIE 10-0.6-0.4-200 MG PO CAPS
1.0000 | ORAL_CAPSULE | Freq: Every day | ORAL | 12 refills | Status: DC
Start: 2016-04-25 — End: 2016-06-30

## 2016-04-25 NOTE — Progress Notes (Signed)
Patient is in the office, reports good fetal movement. 
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
## 2016-04-26 ENCOUNTER — Other Ambulatory Visit: Payer: Self-pay | Admitting: Certified Nurse Midwife

## 2016-04-26 DIAGNOSIS — O0990 Supervision of high risk pregnancy, unspecified, unspecified trimester: Secondary | ICD-10-CM

## 2016-04-26 LAB — HEMOGLOBIN A1C
Est. average glucose Bld gHb Est-mCnc: 103 mg/dL
HEMOGLOBIN A1C: 5.2 % (ref 4.8–5.6)

## 2016-04-27 LAB — CERVICOVAGINAL ANCILLARY ONLY
BACTERIAL VAGINITIS: NEGATIVE
CANDIDA VAGINITIS: NEGATIVE
Chlamydia: NEGATIVE
Neisseria Gonorrhea: NEGATIVE
Trichomonas: NEGATIVE

## 2016-04-29 ENCOUNTER — Encounter: Payer: Self-pay | Admitting: *Deleted

## 2016-04-29 LAB — URINE CULTURE, OB REFLEX

## 2016-04-29 LAB — CULTURE, OB URINE

## 2016-04-30 LAB — VARICELLA ZOSTER ANTIBODY, IGG: Varicella zoster IgG: 2514 index (ref >165)

## 2016-04-30 LAB — OBSTETRIC PANEL, INCLUDING HIV
ANTIBODY SCREEN: NEGATIVE
BASOS ABS: 0 10*3/uL (ref 0.0–0.2)
Basos: 0 %
EOS (ABSOLUTE): 0 10*3/uL (ref 0.0–0.4)
Eos: 1 %
HIV Screen 4th Generation wRfx: NONREACTIVE
Hematocrit: 33.7 % — ABNORMAL LOW (ref 34.0–46.6)
Hemoglobin: 10.8 g/dL — ABNORMAL LOW (ref 11.1–15.9)
Hepatitis B Surface Ag: NEGATIVE
IMMATURE GRANS (ABS): 0 10*3/uL (ref 0.0–0.1)
IMMATURE GRANULOCYTES: 0 %
LYMPHS: 21 %
Lymphocytes Absolute: 1.2 10*3/uL (ref 0.7–3.1)
MCH: 28.6 pg (ref 26.6–33.0)
MCHC: 32 g/dL (ref 31.5–35.7)
MCV: 89 fL (ref 79–97)
MONOCYTES: 7 %
Monocytes Absolute: 0.4 10*3/uL (ref 0.1–0.9)
NEUTROS PCT: 71 %
Neutrophils Absolute: 4 10*3/uL (ref 1.4–7.0)
PLATELETS: 281 10*3/uL (ref 150–379)
RBC: 3.77 x10E6/uL (ref 3.77–5.28)
RDW: 14.7 % (ref 12.3–15.4)
RPR Ser Ql: NONREACTIVE
Rh Factor: POSITIVE
Rubella Antibodies, IGG: 3.26 index (ref >0.99)
WBC: 5.6 10*3/uL (ref 3.4–10.8)

## 2016-04-30 LAB — HEMOGLOBINOPATHY EVALUATION
HEMOGLOBIN A2 QUANTITATION: 2.9 % (ref 1.8–3.2)
HGB C: 0 %
HGB S: 0 %
HGB VARIANT: 0 %
Hemoglobin F Quantitation: 0 % (ref 0.0–2.0)
Hgb A: 97.1 % (ref 96.4–98.8)

## 2016-04-30 LAB — VITAMIN D 25 HYDROXY (VIT D DEFICIENCY, FRACTURES): VIT D 25 HYDROXY: 31.3 ng/mL (ref 30.0–100.0)

## 2016-05-02 ENCOUNTER — Other Ambulatory Visit: Payer: Self-pay | Admitting: Certified Nurse Midwife

## 2016-05-02 ENCOUNTER — Ambulatory Visit (HOSPITAL_COMMUNITY): Payer: Self-pay

## 2016-05-02 DIAGNOSIS — O99820 Streptococcus B carrier state complicating pregnancy: Secondary | ICD-10-CM

## 2016-05-02 DIAGNOSIS — O0990 Supervision of high risk pregnancy, unspecified, unspecified trimester: Secondary | ICD-10-CM

## 2016-05-02 LAB — MATERNIT GENOME

## 2016-05-03 ENCOUNTER — Inpatient Hospital Stay (HOSPITAL_COMMUNITY)
Admission: AD | Admit: 2016-05-03 | Discharge: 2016-05-03 | Disposition: A | Discharge status: Home / Self Care | Payer: Medicaid Other | Source: Ambulatory Visit | Attending: Obstetrics & Gynecology | Admitting: Obstetrics & Gynecology

## 2016-05-03 ENCOUNTER — Encounter (HOSPITAL_COMMUNITY): Payer: Self-pay

## 2016-05-03 ENCOUNTER — Ambulatory Visit (HOSPITAL_COMMUNITY)
Admission: RE | Admit: 2016-05-03 | Discharge: 2016-05-03 | Disposition: A | Discharge status: Home / Self Care | Payer: Medicaid Other | Source: Ambulatory Visit | Attending: Certified Nurse Midwife | Admitting: Certified Nurse Midwife

## 2016-05-03 ENCOUNTER — Other Ambulatory Visit: Payer: Self-pay | Admitting: Certified Nurse Midwife

## 2016-05-03 ENCOUNTER — Ambulatory Visit (HOSPITAL_COMMUNITY): Admission: RE | Admit: 2016-05-03 | Payer: Medicaid Other | Source: Ambulatory Visit

## 2016-05-03 DIAGNOSIS — Z7982 Long term (current) use of aspirin: Secondary | ICD-10-CM | POA: Diagnosis not present

## 2016-05-03 DIAGNOSIS — O99212 Obesity complicating pregnancy, second trimester: Secondary | ICD-10-CM | POA: Insufficient documentation

## 2016-05-03 DIAGNOSIS — R030 Elevated blood-pressure reading, without diagnosis of hypertension: Secondary | ICD-10-CM | POA: Diagnosis present

## 2016-05-03 DIAGNOSIS — O09522 Supervision of elderly multigravida, second trimester: Secondary | ICD-10-CM | POA: Diagnosis not present

## 2016-05-03 DIAGNOSIS — O0990 Supervision of high risk pregnancy, unspecified, unspecified trimester: Secondary | ICD-10-CM

## 2016-05-03 DIAGNOSIS — E221 Hyperprolactinemia: Secondary | ICD-10-CM

## 2016-05-03 DIAGNOSIS — Z3A26 26 weeks gestation of pregnancy: Secondary | ICD-10-CM

## 2016-05-03 DIAGNOSIS — O132 Gestational [pregnancy-induced] hypertension without significant proteinuria, second trimester: Secondary | ICD-10-CM | POA: Diagnosis not present

## 2016-05-03 DIAGNOSIS — R569 Unspecified convulsions: Secondary | ICD-10-CM

## 2016-05-03 DIAGNOSIS — O133 Gestational [pregnancy-induced] hypertension without significant proteinuria, third trimester: Secondary | ICD-10-CM | POA: Insufficient documentation

## 2016-05-03 DIAGNOSIS — F445 Conversion disorder with seizures or convulsions: Secondary | ICD-10-CM

## 2016-05-03 DIAGNOSIS — Z3689 Encounter for other specified antenatal screening: Secondary | ICD-10-CM | POA: Insufficient documentation

## 2016-05-03 LAB — CBC
HCT: 30.8 % — ABNORMAL LOW (ref 36.0–46.0)
HEMOGLOBIN: 10.4 g/dL — AB (ref 12.0–15.0)
MCH: 30 pg (ref 26.0–34.0)
MCHC: 33.8 g/dL (ref 30.0–36.0)
MCV: 88.8 fL (ref 78.0–100.0)
Platelets: 257 10*3/uL (ref 150–400)
RBC: 3.47 MIL/uL — ABNORMAL LOW (ref 3.87–5.11)
RDW: 14.4 % (ref 11.5–15.5)
WBC: 5.9 10*3/uL (ref 4.0–10.5)

## 2016-05-03 LAB — COMPREHENSIVE METABOLIC PANEL
ALBUMIN: 2.7 g/dL — AB (ref 3.5–5.0)
ALK PHOS: 41 U/L (ref 38–126)
ALT: 9 U/L — ABNORMAL LOW (ref 14–54)
ANION GAP: 9 (ref 5–15)
AST: 13 U/L — ABNORMAL LOW (ref 15–41)
BILIRUBIN TOTAL: 0.5 mg/dL (ref 0.3–1.2)
BUN: 14 mg/dL (ref 6–20)
CALCIUM: 8.7 mg/dL — AB (ref 8.9–10.3)
CO2: 24 mmol/L (ref 22–32)
Chloride: 102 mmol/L (ref 101–111)
Creatinine, Ser: 0.72 mg/dL (ref 0.44–1.00)
GFR calc non Af Amer: >60 mL/min (ref >60)
Glucose, Bld: 81 mg/dL (ref 65–99)
POTASSIUM: 3.9 mmol/L (ref 3.5–5.1)
SODIUM: 135 mmol/L (ref 135–145)
TOTAL PROTEIN: 6.3 g/dL — AB (ref 6.5–8.1)

## 2016-05-03 LAB — URINALYSIS, ROUTINE W REFLEX MICROSCOPIC
BILIRUBIN URINE: NEGATIVE
Glucose, UA: NEGATIVE mg/dL
HGB URINE DIPSTICK: NEGATIVE
Ketones, ur: NEGATIVE mg/dL
Nitrite: NEGATIVE
Protein, ur: NEGATIVE mg/dL
SPECIFIC GRAVITY, URINE: 1.02 (ref 1.005–1.030)
pH: 6 (ref 5.0–8.0)

## 2016-05-03 LAB — CYSTIC FIBROSIS MUTATION 97: GENE DIS ANAL CARRIER INTERP BLD/T-IMP: NOT DETECTED

## 2016-05-03 LAB — URINALYSIS, MICROSCOPIC (REFLEX)

## 2016-05-03 LAB — PROTEIN / CREATININE RATIO, URINE
Creatinine, Urine: 190 mg/dL
PROTEIN CREATININE RATIO: 0.09 mg/mg{creat} (ref 0.00–0.15)
TOTAL PROTEIN, URINE: 18 mg/dL

## 2016-05-03 MED ORDER — ASPIRIN 81 MG PO CHEW: 81.0000 mg | CHEWABLE_TABLET | Freq: Every day | ORAL | 1 refills | Status: DC

## 2016-05-03 NOTE — MAU Note (Signed)
Patient states she was sent from the office for evaluation of elevated blood pressure. Denies headache, change in vision, epigastic pain, or increase in swelling. Denies LOF, VB, contractions at this time. +FM

## 2016-05-03 NOTE — Discharge Instructions (Signed)
Hypertension During Pregnancy °Hypertension, commonly called high blood pressure, is when the force of blood pumping through your arteries is too strong. Arteries are blood vessels that carry blood from the heart throughout the body. Hypertension during pregnancy can cause problems for you and your baby. Your baby may be born early (prematurely) or may not weigh as much as he or she should at birth. Very bad cases of hypertension during pregnancy can be life-threatening. °Different types of hypertension can occur during pregnancy. These include: °· Chronic hypertension. This happens when: °¨ You have hypertension before pregnancy and it continues during pregnancy. °¨ You develop hypertension before you are [redacted] weeks pregnant, and it continues during pregnancy. °· Gestational hypertension. This is hypertension that develops after the 20th week of pregnancy. °· Preeclampsia, also called toxemia of pregnancy. This is a very serious type of hypertension that develops only during pregnancy. It affects the whole body, and it can be very dangerous for you and your baby. °Gestational hypertension and preeclampsia usually go away within 6 weeks after your baby is born. Women who have hypertension during pregnancy have a greater chance of developing hypertension later in life or during future pregnancies. °What are the causes? °The exact cause of hypertension is not known. °What increases the risk? °There are certain factors that make it more likely for you to develop hypertension during pregnancy. These include: °· Having hypertension during a previous pregnancy or prior to pregnancy. °· Being overweight. °· Being older than age 40. °· Being pregnant for the first time or being pregnant with more than one baby. °· Becoming pregnant using fertilization methods such as IVF (in vitro fertilization). °· Having diabetes, kidney problems, or systemic lupus erythematosus. °· Having a family history of hypertension. °What are the  signs or symptoms? °Chronic hypertension and gestational hypertension rarely cause symptoms. Preeclampsia causes symptoms, which may include: °· Increased protein in your urine. Your health care provider will check for this at every visit before you give birth (prenatal visit). °· Severe headaches. °· Sudden weight gain. °· Swelling of the hands, face, legs, and feet. °· Nausea and vomiting. °· Vision problems, such as blurred or double vision. °· Numbness in the face, arms, legs, and feet. °· Dizziness. °· Slurred speech. °· Sensitivity to bright lights. °· Abdominal pain. °· Convulsions. °How is this diagnosed? °You may be diagnosed with hypertension during a routine prenatal exam. At each prenatal visit, you may: °· Have a urine test to check for high amounts of protein in your urine. °· Have your blood pressure checked. A blood pressure reading is recorded as two numbers, such as "120 over 80" (or 120/80). The first ("top") number is called the systolic pressure. It is a measure of the pressure in your arteries when your heart beats. The second ("bottom") number is called the diastolic pressure. It is a measure of the pressure in your arteries as your heart relaxes between beats. Blood pressure is measured in a unit called mm Hg. A normal blood pressure reading is: °¨ Systolic: below 120. °¨ Diastolic: below 80. °The type of hypertension that you are diagnosed with depends on your test results and when your symptoms developed. °· Chronic hypertension is usually diagnosed before 20 weeks of pregnancy. °· Gestational hypertension is usually diagnosed after 20 weeks of pregnancy. °· Hypertension with high amounts of protein in the urine is diagnosed as preeclampsia. °· Blood pressure measurements that stay above 160 systolic, or above 110 diastolic, are signs of severe preeclampsia. °  How is this treated? °Treatment for hypertension during pregnancy varies depending on the type of hypertension you have and how  serious it is. °· If you take medicines called ACE inhibitors to treat chronic hypertension, you may need to switch medicines. ACE inhibitors should not be taken during pregnancy. °· If you have gestational hypertension, you may need to take blood pressure medicine. °· If you are at risk for preeclampsia, your health care provider may recommend that you take a low-dose aspirin every day to prevent high blood pressure during your pregnancy. °· If you have severe preeclampsia, you may need to be hospitalized so you and your baby can be monitored closely. You may also need to take medicine (magnesium sulfate) to prevent seizures and to lower blood pressure. This medicine may be given as an injection or through an IV tube. °· In some cases, if your condition gets worse, you may need to deliver your baby early. °Follow these instructions at home: °Eating and drinking °· Drink enough fluid to keep your urine clear or pale yellow. °· Eat a healthy diet that is low in salt (sodium). Do not add salt to your food. Check food labels to see how much sodium a food or beverage contains. °Lifestyle °· Do not use any products that contain nicotine or tobacco, such as cigarettes and e-cigarettes. If you need help quitting, ask your health care provider. °· Do not use alcohol. °· Avoid caffeine. °· Avoid stress as much as possible. Rest and get plenty of sleep. °General instructions °· Take over-the-counter and prescription medicines only as told by your health care provider. °· While lying down, lie on your left side. This keeps pressure off your baby. °· While sitting or lying down, raise (elevate) your feet. Try putting some pillows under your lower legs. °· Exercise regularly. Ask your health care provider what kinds of exercise are best for you. °· Keep all prenatal and follow-up visits as told by your health care provider. This is important. °Contact a health care provider if: °· You have symptoms that your health care provider  told you may require more treatment or monitoring, such as: °¨ Fever. °¨ Vomiting. °¨ Headache. °Get help right away if: °· You have severe abdominal pain or vomiting that does not get better with treatment. °· You suddenly develop swelling in your hands, ankles, or face. °· You gain 4 lbs (1.8 kg) or more in 1 week. °· You develop vaginal bleeding, or you have blood in your urine. °· You do not feel your baby moving as much as usual. °· You have blurred or double vision. °· You have muscle twitching or sudden tightening (spasms). °· You have shortness of breath. °· Your lips or fingernails turn blue. °This information is not intended to replace advice given to you by your health care provider. Make sure you discuss any questions you have with your health care provider. °Document Released: 10/16/2010 Document Revised: 08/18/2015 Document Reviewed: 07/14/2015 °Elsevier Interactive Patient Education © 2017 [employer] ° °

## 2016-05-03 NOTE — ED Notes (Signed)
Pt was seen in [facility] today for U/S. Pt BP prior to U/S was 136/102 Left and 139/100 Rt arm.  Pt denies any h/a, epigastric pain, vision changes, but does have some swelling in feet.  Rechecked BP at end of appointment, 137/102, HR 91.  Dr. Burnett Harry spoke with Dr. Roselie Awkward. Report given to [facility] in [facility]U.  Pt taken to MAU and registered with family.

## 2016-05-03 NOTE — MAU Provider Note (Signed)
History     CSN: [id number]  Arrival date and time: 05/03/16 1652   First Provider Initiated Contact with Patient 05/03/16 1745      Chief Complaint  Patient presents with  . Hypertension   G2P0010 @26  weeks sent here after BP was elevated today in MFM (there for detailed [place]). She denies HA, visual disturbances, and epigastric pain. Reports good FM. No VB, LOF, or ctx. No edema. Pregnancy has been complicated by AMA, conversion disorder w/seizures, and Bipolar.   Past Medical History:  Diagnosis Date  . Anxiety   . Bipolar 1 disorder ([place])   . Depression   . Mental disorder   . Right foot injury 10/09/2012   Resolved without complication    Past Surgical History:  Procedure Laterality Date  . FOOT SURGERY    . LAPAROSCOPIC GASTRIC SLEEVE RESECTION  10/09/2012   Surgery was on 09/30/2011    Family History  Problem Relation Age of Onset  . Hypertension Mother   . Diabetes Mother   . Diabetes Father   . Hypertension Father   . Cancer Other   . Alzheimer's disease Other   . Aneurysm Other   . Anxiety disorder Other   . Depression Other     Social History  Substance Use Topics  . Smoking status: Never Smoker  . Smokeless tobacco: Never Used  . Alcohol use No    Allergies:  Allergies  Allergen Reactions  . Morphine Rash    Other reaction(s): Other (See Comments) Other Reaction: Other reaction  . Morphine And Related     Psychotic reactions    Prescriptions Prior to Admission  Medication Sig Dispense Refill Last Dose  . folic acid (FOLVITE) 1 MG tablet Take 4 tablets (4 mg total) by mouth daily. 120 tablet 5 05/02/2016 at Unknown time  . gabapentin (NEURONTIN) 300 MG capsule Take 300 mg by mouth at bedtime.   05/02/2016 at Unknown time  . lamoTRIgine (LAMICTAL) 150 MG tablet Take 2 tablets (300 mg total) by mouth every evening. 60 tablet 0 05/02/2016 at Unknown time  . Omega-3 Fatty Acids (FISH OIL PO) Take 1 capsule by mouth daily.   05/02/2016 at Unknown time   . Prenat-FeAsp-Meth-FA-DHA w/o A (PRENATE PIXIE) 10-0.6-0.4-200 MG CAPS Take 1 tablet by mouth daily. 30 capsule 12 05/02/2016 at Unknown time  . QUEtiapine (SEROQUEL) 300 MG tablet Take 600 mg by mouth at bedtime.   05/02/2016 at Unknown time  . traZODone (DESYREL) 100 MG tablet Take 100 mg by mouth at bedtime.   05/02/2016 at Unknown time  . lamoTRIgine (LAMICTAL) 25 MG tablet Take 1 tablet (25 mg total) by mouth daily. (Patient not taking: Reported on 05/03/2016) 30 tablet 0 Not Taking  . QUEtiapine (SEROQUEL) 400 MG tablet Take 1.5 tablets (600 mg total) by mouth at bedtime. (Patient not taking: Reported on 05/03/2016) 45 tablet 0 Not Taking at Unknown time  . traZODone (DESYREL) 50 MG tablet Take 1 tablet (50 mg total) by mouth at bedtime and may repeat dose one time if needed. (Patient not taking: Reported on 05/03/2016) 14 tablet 0 Not Taking at Unknown time    Review of Systems  Eyes: Negative for visual disturbance.  Gastrointestinal: Negative for abdominal pain.  Genitourinary: Negative for vaginal bleeding.  Neurological: Negative for headaches.   Physical Exam   Blood pressure (!) 133/97, pulse 89, temperature 98 F (36.7 C), temperature source Oral, resp. rate 18, weight 122.5 kg (270 lb 1.3 oz), last menstrual period 11/03/2015,  SpO2 100 %.  Patient Vitals for the past 24 hrs:  BP Temp Temp src Pulse Resp SpO2 Weight  05/03/16 1859 (!) 133/94 98.2 F (36.8 C) Oral 73 20 100 % -  05/03/16 1856 (!) 140/96 - - 87 - - -  05/03/16 1846 (!) 133/94 - - 73 - - -  05/03/16 1831 135/88 - - 78 - - -  05/03/16 1816 (!) 125/91 - - 76 - - -  05/03/16 1801 129/83 - - 85 - - -  05/03/16 1746 (!) 133/97 - - 89 - - -  05/03/16 1740 (!) 140/100 - - 85 - - -  05/03/16 1706 (!) 146/107 98 F (36.7 C) Oral 78 18 100 % 122.5 kg (270 lb 1.3 oz)    Physical Exam  Constitutional: She is oriented to person, place, and time. She appears well-developed and well-nourished. No distress.  HENT:  Head:  Normocephalic.  Neck: Normal range of motion.  Cardiovascular: Normal rate, regular rhythm and normal heart sounds.   Respiratory: Effort normal and breath sounds normal. No respiratory distress.  GI: Soft. She exhibits no distension. There is no tenderness.  gravid  Musculoskeletal: Normal range of motion. She exhibits no edema.  Neurological: She is alert and oriented to person, place, and time. She has normal reflexes.  Skin: Skin is warm and dry.  Psychiatric: She has a normal mood and affect.   EFM: 135 bpm, mod variability, + accels, rare variable decels Toco: irregular  Results for orders placed or performed during the hospital encounter of 05/03/16 (from the past 24 hour(s))  Urinalysis, Routine w reflex microscopic     Status: Abnormal   Collection Time: 05/03/16  5:06 PM  Result Value Ref Range   Color, Urine YELLOW YELLOW   APPearance CLEAR CLEAR   Specific Gravity, Urine 1.020 1.005 - 1.030   pH 6.0 5.0 - 8.0   Glucose, UA NEGATIVE NEGATIVE mg/dL   Hgb urine dipstick NEGATIVE NEGATIVE   Bilirubin Urine NEGATIVE NEGATIVE   Ketones, ur NEGATIVE NEGATIVE mg/dL   Protein, ur NEGATIVE NEGATIVE mg/dL   Nitrite NEGATIVE NEGATIVE   Leukocytes, UA SMALL (A) NEGATIVE  Protein / creatinine ratio, urine     Status: None   Collection Time: 05/03/16  5:06 PM  Result Value Ref Range   Creatinine, Urine 190.00 mg/dL   Total Protein, Urine 18 mg/dL   Protein Creatinine Ratio 0.09 0.00 - 0.15 mg/mg[Cre]  Urinalysis, Microscopic (reflex)     Status: Abnormal   Collection Time: 05/03/16  5:06 PM  Result Value Ref Range   RBC / HPF 0-5 0 - 5 RBC/hpf   WBC, UA 0-5 0 - 5 WBC/hpf   Bacteria, UA FEW (A) NONE SEEN   Squamous Epithelial / LPF 0-5 (A) NONE SEEN  CBC     Status: Abnormal   Collection Time: 05/03/16  5:48 PM  Result Value Ref Range   WBC 5.9 4.0 - 10.5 K/uL   RBC 3.47 (L) 3.87 - 5.11 MIL/uL   Hemoglobin 10.4 (L) 12.0 - 15.0 g/dL   HCT 30.8 (L) 36.0 - 46.0 %   MCV 88.8  78.0 - 100.0 fL   MCH 30.0 26.0 - 34.0 pg   MCHC 33.8 30.0 - 36.0 g/dL   RDW 14.4 11.5 - 15.5 %   Platelets 257 150 - 400 K/uL  Comprehensive metabolic panel     Status: Abnormal   Collection Time: 05/03/16  5:48 PM  Result Value Ref Range  Sodium 135 135 - 145 mmol/L   Potassium 3.9 3.5 - 5.1 mmol/L   Chloride 102 101 - 111 mmol/L   CO2 24 22 - 32 mmol/L   Glucose, Bld 81 65 - 99 mg/dL   BUN 14 6 - 20 mg/dL   Creatinine, Ser 0.72 0.44 - 1.00 mg/dL   Calcium 8.7 (L) 8.9 - 10.3 mg/dL   Total Protein 6.3 (L) 6.5 - 8.1 g/dL   Albumin 2.7 (L) 3.5 - 5.0 g/dL   AST 13 (L) 15 - 41 U/L   ALT 9 (L) 14 - 54 U/L   Alkaline Phosphatase 41 38 - 126 U/L   Total Bilirubin 0.5 0.3 - 1.2 mg/dL   GFR calc non Af Amer >60 >60 mL/min   GFR calc Af Amer >60 >60 mL/min   Anion gap 9 5 - 15    MAU Course  Procedures  MDM Labs ordered and reviewed. No evidence of pre-e, labs nml. BPs improved. HTN gestational vs chronic (one elevated BP prior to pregnancy). Will start baby ASA and close interval f/u. Consult with Dr. Roselie Awkward, agrees with plan. Stable for discharge home.  Assessment and Plan   1. [redacted] weeks gestation of pregnancy   2. Gestational hypertension, second trimester   3. Elderly multigravida in second trimester    Discharge home Follow up at [facility] early next week Pre-e precautions Rx ASA  Allergies as of 05/03/2016      Reactions   Morphine Rash   Other reaction(s): Other (See Comments) Other Reaction: Other reaction   Morphine And Related    Psychotic reactions      Medication List    TAKE these medications   aspirin 81 MG chewable tablet Chew 1 tablet (81 mg total) by mouth daily.   FISH OIL PO Take 1 capsule by mouth daily.   folic acid 1 MG tablet Commonly known as:  FOLVITE Take 4 tablets (4 mg total) by mouth daily.   gabapentin 300 MG capsule Commonly known as:  NEURONTIN Take 300 mg by mouth at bedtime.   lamoTRIgine 150 MG tablet Commonly known as:   LAMICTAL Take 2 tablets (300 mg total) by mouth every evening. What changed:  Another medication with the same name was removed. Continue taking this medication, and follow the directions you see here.   PRENATE PIXIE 10-0.6-0.4-200 MG Caps Take 1 tablet by mouth daily.   QUEtiapine 300 MG tablet Commonly known as:  SEROQUEL Take 600 mg by mouth at bedtime. What changed:  Another medication with the same name was removed. Continue taking this medication, and follow the directions you see here.   traZODone 100 MG tablet Commonly known as:  DESYREL Take 100 mg by mouth at bedtime. What changed:  Another medication with the same name was removed. Continue taking this medication, and follow the directions you see here.      Julianne Handler, CNM 05/03/2016, 5:52 PM

## 2016-05-06 ENCOUNTER — Other Ambulatory Visit: Payer: Self-pay | Admitting: Certified Nurse Midwife

## 2016-05-06 DIAGNOSIS — O0990 Supervision of high risk pregnancy, unspecified, unspecified trimester: Secondary | ICD-10-CM

## 2016-05-07 ENCOUNTER — Ambulatory Visit (INDEPENDENT_AMBULATORY_CARE_PROVIDER_SITE_OTHER): Payer: Medicaid Other | Admitting: Obstetrics & Gynecology

## 2016-05-07 VITALS — BP 146/97 | HR 123 | Wt 267.9 lb

## 2016-05-07 DIAGNOSIS — O132 Gestational [pregnancy-induced] hypertension without significant proteinuria, second trimester: Secondary | ICD-10-CM

## 2016-05-07 DIAGNOSIS — O0990 Supervision of high risk pregnancy, unspecified, unspecified trimester: Secondary | ICD-10-CM

## 2016-05-07 MED ORDER — LABETALOL HCL 100 MG PO TABS: 100.0000 mg | ORAL_TABLET | Freq: Two times a day (BID) | ORAL | 2 refills | Status: DC

## 2016-05-07 NOTE — Progress Notes (Signed)
   PRENATAL VISIT NOTE  Subjective:  Casey Lang is a 36 y.o. G2P0010 at [redacted]w[redacted]d being seen today for ongoing prenatal care.  She is currently monitored for the following issues for this high-risk pregnancy and has Bipolar disorder, curr episode mixed, severe, with psychotic features ([place]); Hyperprolactinemia ([place]); PTSD (post-traumatic stress disorder); GAD (generalized anxiety disorder); Bipolar I disorder, most recent episode (or current) manic ([place]); Supervision of high risk pregnancy, antepartum; Pseudoseizures; AMA (advanced maternal age) multigravida 64+; Conversion disorder with attacks or seizures; GBS (group B Streptococcus carrier), +RV culture, currently pregnant; and Gestational hypertension, second trimester on her problem list.  Patient reports no complaints.  Contractions: Regular. Vag. Bleeding: None.  Movement: Absent. Denies leaking of fluid.   The following portions of the patient's history were reviewed and updated as appropriate: allergies, current medications, past family history, past medical history, past social history, past surgical history and problem list. Problem list updated.  Objective:   Vitals:   05/07/16 1018  BP: (!) 146/97  Pulse: (!) 123  Weight: 267 lb 14.4 oz (121.5 kg)    Fetal Status: Fetal Heart Rate (bpm): 159   Movement: Absent     General:  Alert, oriented and cooperative. Patient is in no acute distress.  Skin: Skin is warm and dry. No rash noted.   Cardiovascular: Normal heart rate noted  Respiratory: Normal respiratory effort, no problems with respiration noted  Abdomen: Soft, gravid, appropriate for gestational age. Pain/Pressure: Absent     Pelvic:  Cervical exam deferred        Extremities: Normal range of motion.  Edema: None  Mental Status: Normal mood and affect. Normal behavior. Normal judgment and thought content.   Assessment and Plan:  Pregnancy: G2P0010 at [redacted]w[redacted]d  1. Supervision of high risk pregnancy, antepartum Normal  labs 3/23 in MAU, start medication for BP - [place] MFM OB FOLLOW UP; Future - labetalol (NORMODYNE) 100 MG tablet; Take 1 tablet (100 mg total) by mouth 2 (two) times daily.  Dispense: 60 tablet; Refill: 2  2. Gestational hypertension, second trimester Possible CHTN  Preterm labor symptoms and general obstetric precautions including but not limited to vaginal bleeding, contractions, leaking of fluid and fetal movement were reviewed in detail with the patient. Please refer to After Visit Summary for other counseling recommendations.  Return in about 2 days (around 05/09/2016). 2 hr GT  Woodroe Mode, MD

## 2016-05-09 ENCOUNTER — Ambulatory Visit (INDEPENDENT_AMBULATORY_CARE_PROVIDER_SITE_OTHER): Payer: Medicaid Other | Admitting: Obstetrics & Gynecology

## 2016-05-09 ENCOUNTER — Encounter: Payer: Self-pay | Admitting: Obstetrics & Gynecology

## 2016-05-09 ENCOUNTER — Encounter: Payer: Self-pay | Admitting: Certified Nurse Midwife

## 2016-05-09 DIAGNOSIS — O0990 Supervision of high risk pregnancy, unspecified, unspecified trimester: Secondary | ICD-10-CM

## 2016-05-09 DIAGNOSIS — O132 Gestational [pregnancy-induced] hypertension without significant proteinuria, second trimester: Secondary | ICD-10-CM

## 2016-05-09 MED ORDER — LABETALOL HCL 200 MG PO TABS
200.0000 mg | ORAL_TABLET | Freq: Two times a day (BID) | ORAL | 2 refills | Status: DC
Start: 2016-05-09 — End: 2016-05-29

## 2016-05-09 NOTE — Patient Instructions (Signed)
Hypertension During Pregnancy Hypertension is also called high blood pressure. High blood pressure means that the force of your blood moving in your body is too strong. When you are pregnant, this condition should be watched carefully. It can cause problems for you and your baby. Follow these instructions at home: Eating and drinking  Drink enough fluid to keep your pee (urine) clear or pale yellow.  Eat healthy foods that are low in salt (sodium). ? Do not add salt to your food. ? Check labels on foods and drinks to see much salt is in them. Look on the label where you see "Sodium." Lifestyle  Do not use any products that contain nicotine or tobacco, such as cigarettes and e-cigarettes. If you need help quitting, ask your doctor.  Do not use alcohol.  Avoid caffeine.  Avoid stress. Rest and get plenty of sleep. General instructions  Take over-the-counter and prescription medicines only as told by your doctor.  While lying down, lie on your left side. This keeps pressure off your baby.  While sitting or lying down, raise (elevate) your feet. Try putting some pillows under your lower legs.  Exercise regularly. Ask your doctor what kinds of exercise are best for you.  Keep all prenatal and follow-up visits as told by your doctor. This is important. Contact a doctor if:  You have symptoms that your doctor told you to watch for, such as: ? Fever. ? Throwing up (vomiting). ? Headache. Get help right away if:  You have very bad pain in your belly (abdomen).  You are throwing up, and this does not get better with treatment.  You suddenly get swelling in your hands, ankles, or face.  You gain 4 lb (1.8 kg) or more in 1 week.  You get bleeding from your vagina.  You have blood in your pee.  You do not feel your baby moving as much as normal.  You have a change in vision.  You have muscle twitching or sudden tightening (spasms).  You have trouble breathing.  Your lips  or fingernails turn blue. This information is not intended to replace advice given to you by your health care provider. Make sure you discuss any questions you have with your health care provider. Document Released: 03/02/2010 Document Revised: 10/10/2015 Document Reviewed: 10/10/2015 Elsevier Interactive Patient Education  2017 [employer]  
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
## 2016-05-09 NOTE — Progress Notes (Signed)
   PRENATAL VISIT NOTE  Subjective:  Casey Lang is a 36 y.o. G2P0010 at [redacted]w[redacted]d being seen today for ongoing prenatal care.  She is currently monitored for the following issues for this high-risk pregnancy and has Bipolar disorder, curr episode mixed, severe, with psychotic features ([place]); Hyperprolactinemia ([place]); PTSD (post-traumatic stress disorder); GAD (generalized anxiety disorder); Bipolar I disorder, most recent episode (or current) manic ([place]); Supervision of high risk pregnancy, antepartum; Pseudoseizures; AMA (advanced maternal age) multigravida 53+; Conversion disorder with attacks or seizures; GBS (group B Streptococcus carrier), +RV culture, currently pregnant; and Gestational hypertension, second trimester on her problem list.  Patient reports worried about her BP and weight gain.  Contractions: Not present. Vag. Bleeding: None.  Movement: Present. Denies leaking of fluid.   The following portions of the patient's history were reviewed and updated as appropriate: allergies, current medications, past family history, past medical history, past social history, past surgical history and problem list. Problem list updated.  Objective:   Vitals:   05/09/16 1511  BP: (!) 140/95  Pulse: 73  Weight: 272 lb (123.4 kg)    Fetal Status:     Movement: Present     General:  Alert, oriented and cooperative. Patient is in no acute distress.  Skin: Skin is warm and dry. No rash noted.   Cardiovascular: Normal heart rate noted  Respiratory: Normal respiratory effort, no problems with respiration noted  Abdomen: Soft, gravid, appropriate for gestational age. Pain/Pressure: Present     Pelvic:  Cervical exam deferred        Extremities: Normal range of motion.  Edema: Trace  Mental Status: Normal mood and affect. Normal behavior. Normal judgment and thought content.   Assessment and Plan:  Pregnancy: G2P0010 at [redacted]w[redacted]d  1. Supervision of high risk pregnancy, antepartum Bipolar,  psychiatrist is in [place]  2. Gestational hypertension, second trimester Increase labetalol to 200 BID  Preterm labor symptoms and general obstetric precautions including but not limited to vaginal bleeding, contractions, leaking of fluid and fetal movement were reviewed in detail with the patient. Please refer to After Visit Summary for other counseling recommendations.  Return in about 1 week (around 05/16/2016). PIH precautions  Woodroe Mode, MD

## 2016-05-10 ENCOUNTER — Encounter: Payer: Self-pay | Admitting: Neurology

## 2016-05-10 ENCOUNTER — Ambulatory Visit (INDEPENDENT_AMBULATORY_CARE_PROVIDER_SITE_OTHER): Payer: Medicaid Other | Admitting: Neurology

## 2016-05-10 VITALS — BP 116/69 | HR 85 | Wt 269.2 lb

## 2016-05-10 DIAGNOSIS — F445 Conversion disorder with seizures or convulsions: Secondary | ICD-10-CM | POA: Diagnosis not present

## 2016-05-10 NOTE — Patient Instructions (Signed)
Remember to drink plenty of fluid, eat healthy meals and do not skip any meals. Try to eat protein with a every meal and eat a healthy snack such as fruit or nuts in between meals. Try to keep a regular sleep-wake schedule and try to exercise daily, particularly in the form of walking, 20-30 minutes a day, if you can.   I would like to see you back as needed, sooner if we need to. Please call us with any interim questions, concerns, problems, updates or refill requests.   Our phone number is [phone]. We also have an after hours call service for urgent matters and there is a physician on-call for urgent questions. For any emergencies you know to call 911 or go to the nearest emergency room   Non-Epileptic Seizures, Adult A seizure can cause:  Involuntary movements, like falling or shaking.  Changes in awareness or consciousness.  Convulsions. These are episodes of uncontrollable, jerking movement caused by sudden, intense tightening (contraction) of the muscles. Epileptic seizures are caused by abnormal electrical activity in the brain. Non-epileptic seizures are different. They are not caused by abnormal electrical signals in your brain. These seizures may look like epileptic seizures, but they are not caused by epilepsy. There are two types of non-epileptic seizures:  Physiologic non-epileptic seizure. This type results from an underlying problem that causes a disruption in your brain's electrical activity.  Psychogenic non-epileptic seizure. This type results from emotional stress. These seizures are sometimes called pseudoseizures. What are the causes? Causes of physiologic non-epileptic seizures can include:  Sudden drop in blood pressure.  Low blood sugar (glucose).  Low levels of salt (sodium) in your blood.  Low levels of calcium in your blood.  Migraine.  Heart rhythm problems.  Sleep disorders, such as narcolepsy.  Movement disorders, such as Tourette  syndrome.  Infection.  Certain medicines.  Drug and alcohol abuse.  Fever. Common causes of psychogenic non-epileptic seizures can include:  Stress.  Emotional trauma.  Sexual or physical abuse.  Major life events, such as divorce or death of a loved one.  Mental health disorders, including anxiety and depression. What are the signs or symptoms? Symptoms of a non-epileptic seizure can be similar to those of an epileptic seizure, which may include:  A change in attention or behavior (altered mental status).  Loss of consciousness or fainting.  Convulsions with rhythmic jerking movements.  Drooling.  Rapid eye movements.  Grunting.  Loss of bladder control and bowel control.  Bitter taste in the mouth.  Tongue biting. Some people experience unusual sensations (aura) before having a seizure. These can include:  "Butterflies" in the stomach.  Abnormal smells or tastes.  A feeling of having had a new experience before (dj vu). After a non-epileptic seizure, you may have a headache or sore muscles or feel confused and sleepy. Non-epileptic seizures usually:  Do not cause physical injuries.  Start slowly.  Include crying or shrieking.  Last longer than 2 minutes.  Include pelvic thrusting. How is this diagnosed? Non-epileptic seizures may be diagnosed by:  Your medical history.  A physical exam.  Your symptoms.  Your health care provider may want to talk with your friends or relatives who have seen you have a seizure.  If possible, it is helpful if you write down your seizure activity, including what led up to the seizure, and share that information with your health care provider. You may also need to have tests to look for causes of physiologic non-epileptic seizures.  These may include:  An electroencephalogram (EEG). This test measures electrical activity in your brain. If you have had a non-epileptic seizure, the results of your EEG will likely be  normal.  Video EEG. This test takes place in the hospital over the course of 2-7 days. The test uses a video camera and an EEG to monitor your symptoms and the electrical activity in your brain.  Blood tests.  Lumbar puncture. This test involves pulling fluid from your spine to check for infection.  Electrocardiogram (ECG or EKG). This test checks for an abnormal heart rhythm.  CT scan. If your health care provider thinks you have had a psychogenic non-epileptic seizure, you may need to see a mental health specialist for an evaluation. How is this treated? The treatment for your seizures will depend on what is causing them. When the underlying condition is treated, your seizures should stop. If your seizures are being caused by emotional trauma or stress, your health care provider may recommend that you see a mental health professional. Treatment may include:  Relaxation therapy or cognitive behavioral therapy.  Medicines to treat depression or anxiety.  Individual or family counseling. In some cases, you may have psychogenic seizures in addition to epileptic seizures. If this is the case, you may be prescribed medicine to help with the epileptic seizures. Follow these instructions at home: Home care will depend on the type of non-epileptic seizures that you have. In general:  Follow all instructions from your health care provider. These may include ways to prevent seizures and what to do if you have a seizure.  Take over-the-counter and prescription medicines only as told by your health care provider.  Keep all follow-up visits as told by your health care provider. This is important.  Make sure family members, friends, and coworkers are trained on how to help you if you have a seizure. If you have a seizure, they should:  Lay you on the ground to prevent a fall.  Place a pillow or piece of clothing under your head.  Loosen any clothing around your neck.  Turn you onto your  side. If vomiting occurs, this helps keep your airway clear.  Avoid any substances that may prevent your medicine from working properly. If you are prescribed medicine for seizures:  Do not use recreational drugs.  Limit or avoid alcoholic beverages. Contact a health care provider if:  Your seizures change or become more frequent.  You continue to have seizures after treatment. Get help right away if:  You injure yourself during a seizure.  You have one seizure after another.  You have trouble recovering from a seizure.  You have chest pain or trouble breathing.  You have a seizure that lasts longer than 5 minutes. Summary  Non-epileptic seizures may look like epileptic seizures, but they are not caused by epilepsy.  The treatment for your seizures will depend on what is causing them. When the underlying condition is treated, your seizures should stop.  Make sure family members, friends, and coworkers are trained on how to help you if you have a seizure. If you have a seizure, they should lay you on the ground to prevent a fall, protect your head and neck, and turn you onto your side. This information is not intended to replace advice given to you by your health care provider. Make sure you discuss any questions you have with your health care provider. Document Released: 03/15/2005 Document Revised: 11/10/2015 Document Reviewed: 11/10/2015 Elsevier Interactive Patient Education  2017 [employer]  

## 2016-05-14 ENCOUNTER — Telehealth: Payer: Self-pay | Admitting: *Deleted

## 2016-05-14 NOTE — Telephone Encounter (Signed)
Pt called to office asking about psych referral she was to have.  Follow up with office referral team-[facility].  Referral has been sent to [facility] and they should be contacting pt.  Placed call to pt.  LM on VM making her aware that referral has been submitted and she should expect call in order to set up appt. Advised to contact office by end of week if she has not heard from [facility].

## 2016-05-16 ENCOUNTER — Other Ambulatory Visit: Payer: Medicaid Other

## 2016-05-16 ENCOUNTER — Ambulatory Visit (INDEPENDENT_AMBULATORY_CARE_PROVIDER_SITE_OTHER): Payer: Medicaid Other | Admitting: Obstetrics & Gynecology

## 2016-05-16 VITALS — BP 137/93 | HR 97 | Wt 269.0 lb

## 2016-05-16 DIAGNOSIS — O0990 Supervision of high risk pregnancy, unspecified, unspecified trimester: Secondary | ICD-10-CM | POA: Diagnosis not present

## 2016-05-16 DIAGNOSIS — Z23 Encounter for immunization: Secondary | ICD-10-CM | POA: Diagnosis not present

## 2016-05-16 NOTE — Patient Instructions (Signed)
Third Trimester of Pregnancy The third trimester is from week 28 through week 40 (months 7 through 9). The third trimester is a time when the unborn baby (fetus) is growing rapidly. At the end of the ninth month, the fetus is about 20 inches in length and weighs 6-10 pounds. Body changes during your third trimester Your body will continue to go through many changes during pregnancy. The changes vary from woman to woman. During the third trimester:  Your weight will continue to increase. You can expect to gain 25-35 pounds (11-16 kg) by the end of the pregnancy.  You may begin to get stretch marks on your hips, abdomen, and breasts.  You may urinate more often because the fetus is moving lower into your pelvis and pressing on your bladder.  You may develop or continue to have heartburn. This is caused by increased hormones that slow down muscles in the digestive tract.  You may develop or continue to have constipation because increased hormones slow digestion and cause the muscles that push waste through your intestines to relax.  You may develop hemorrhoids. These are swollen veins (varicose veins) in the rectum that can itch or be painful.  You may develop swollen, bulging veins (varicose veins) in your legs.  You may have increased body aches in the pelvis, back, or thighs. This is due to weight gain and increased hormones that are relaxing your joints.  You may have changes in your hair. These can include thickening of your hair, rapid growth, and changes in texture. Some women also have hair loss during or after pregnancy, or hair that feels dry or thin. Your hair will most likely return to normal after your baby is born.  Your breasts will continue to grow and they will continue to become tender. A yellow fluid (colostrum) may leak from your breasts. This is the first milk you are producing for your baby.  Your belly button may stick out.  You may notice more swelling in your hands,  face, or ankles.  You may have increased tingling or numbness in your hands, arms, and legs. The skin on your belly may also feel numb.  You may feel short of breath because of your expanding uterus.  You may have more problems sleeping. This can be caused by the size of your belly, increased need to urinate, and an increase in your body's metabolism.  You may notice the fetus "dropping," or moving lower in your abdomen (lightening).  You may have increased vaginal discharge.  You may notice your joints feel loose and you may have pain around your pelvic bone.  What to expect at prenatal visits You will have prenatal exams every 2 weeks until week 36. Then you will have weekly prenatal exams. During a routine prenatal visit:  You will be weighed to make sure you and the baby are growing normally.  Your blood pressure will be taken.  Your abdomen will be measured to track your baby's growth.  The fetal heartbeat will be listened to.  Any test results from the previous visit will be discussed.  You may have a cervical check near your due date to see if your cervix has softened or thinned (effaced).  You will be tested for Group B streptococcus. This happens between 35 and 37 weeks.  Your health care provider may ask you:  What your birth plan is.  How you are feeling.  If you are feeling the baby move.  If you have had   any abnormal symptoms, such as leaking fluid, bleeding, severe headaches, or abdominal cramping.  If you are using any tobacco products, including cigarettes, chewing tobacco, and electronic cigarettes.  If you have any questions.  Other tests or screenings that may be performed during your third trimester include:  Blood tests that check for low iron levels (anemia).  Fetal testing to check the health, activity level, and growth of the fetus. Testing is done if you have certain medical conditions or if there are problems during the  pregnancy.  Nonstress test (NST). This test checks the health of your baby to make sure there are no signs of problems, such as the baby not getting enough oxygen. During this test, a belt is placed around your belly. The baby is made to move, and its heart rate is monitored during movement.  What is false labor? False labor is a condition in which you feel small, irregular tightenings of the muscles in the womb (contractions) that usually go away with rest, changing position, or drinking water. These are called Braxton Hicks contractions. Contractions may last for hours, days, or even weeks before true labor sets in. If contractions come at regular intervals, become more frequent, increase in intensity, or become painful, you should see your health care provider. What are the signs of labor?  Abdominal cramps.  Regular contractions that start at 10 minutes apart and become stronger and more frequent with time.  Contractions that start on the top of the uterus and spread down to the lower abdomen and back.  Increased pelvic pressure and dull back pain.  A watery or bloody mucus discharge that comes from the vagina.  Leaking of amniotic fluid. This is also known as your "water breaking." It could be a slow trickle or a gush. Let your health care provider know if it has a color or strange odor. If you have any of these signs, call your health care provider right away, even if it is before your due date. Follow these instructions at home: Medicines  Follow your health care provider's instructions regarding medicine use. Specific medicines may be either safe or unsafe to take during pregnancy.  Take a prenatal vitamin that contains at least 600 micrograms (mcg) of folic acid.  If you develop constipation, try taking a stool softener if your health care provider approves. Eating and drinking  Eat a balanced diet that includes fresh fruits and vegetables, whole grains, good sources of protein  such as meat, eggs, or tofu, and low-fat dairy. Your health care provider will help you determine the amount of weight gain that is right for you.  Avoid raw meat and uncooked cheese. These carry germs that can cause birth defects in the baby.  If you have low calcium intake from food, talk to your health care provider about whether you should take a daily calcium supplement.  Eat four or five small meals rather than three large meals a day.  Limit foods that are high in fat and processed sugars, such as fried and sweet foods.  To prevent constipation: ? Drink enough fluid to keep your urine clear or pale yellow. ? Eat foods that are high in fiber, such as fresh fruits and vegetables, whole grains, and beans. Activity  Exercise only as directed by your health care provider. Most women can continue their usual exercise routine during pregnancy. Try to exercise for 30 minutes at least 5 days a week. Stop exercising if you experience uterine contractions.  Avoid heavy   lifting.  Do not exercise in extreme heat or humidity, or at high altitudes.  Wear low-heel, comfortable shoes.  Practice good posture.  You may continue to have sex unless your health care provider tells you otherwise. Relieving pain and discomfort  Take frequent breaks and rest with your legs elevated if you have leg cramps or low back pain.  Take warm sitz baths to soothe any pain or discomfort caused by hemorrhoids. Use hemorrhoid cream if your health care provider approves.  Wear a good support bra to prevent discomfort from breast tenderness.  If you develop varicose veins: ? Wear support pantyhose or compression stockings as told by your healthcare provider. ? Elevate your feet for 15 minutes, 3-4 times a day. Prenatal care  Write down your questions. Take them to your prenatal visits.  Keep all your prenatal visits as told by your health care provider. This is important. Safety  Wear your seat belt at  all times when driving.  Make a list of emergency phone numbers, including numbers for family, friends, the hospital, and police and fire departments. General instructions  Avoid cat litter boxes and soil used by cats. These carry germs that can cause birth defects in the baby. If you have a cat, ask someone to clean the litter box for you.  Do not travel far distances unless it is absolutely necessary and only with the approval of your health care provider.  Do not use hot tubs, steam rooms, or saunas.  Do not drink alcohol.  Do not use any products that contain nicotine or tobacco, such as cigarettes and e-cigarettes. If you need help quitting, ask your health care provider.  Do not use any medicinal herbs or unprescribed drugs. These chemicals affect the formation and growth of the baby.  Do not douche or use tampons or scented sanitary pads.  Do not cross your legs for long periods of time.  To prepare for the arrival of your baby: ? Take prenatal classes to understand, practice, and ask questions about labor and delivery. ? Make a trial run to the hospital. ? Visit the hospital and tour the maternity area. ? Arrange for maternity or paternity leave through employers. ? Arrange for family and friends to take care of pets while you are in the hospital. ? Purchase a rear-facing car seat and make sure you know how to install it in your car. ? Pack your hospital bag. ? Prepare the baby's nursery. Make sure to remove all pillows and stuffed animals from the baby's crib to prevent suffocation.  Visit your dentist if you have not gone during your pregnancy. Use a soft toothbrush to brush your teeth and be gentle when you floss. Contact a health care provider if:  You are unsure if you are in labor or if your water has broken.  You become dizzy.  You have mild pelvic cramps, pelvic pressure, or nagging pain in your abdominal area.  You have lower back pain.  You have persistent  nausea, vomiting, or diarrhea.  You have an unusual or bad smelling vaginal discharge.  You have pain when you urinate. Get help right away if:  Your water breaks before 37 weeks.  You have regular contractions less than 5 minutes apart before 37 weeks.  You have a fever.  You are leaking fluid from your vagina.  You have spotting or bleeding from your vagina.  You have severe abdominal pain or cramping.  You have rapid weight loss or weight gain.    You have shortness of breath with chest pain.  You notice sudden or extreme swelling of your face, hands, ankles, feet, or legs.  Your baby makes fewer than 10 movements in 2 hours.  You have severe headaches that do not go away when you take medicine.  You have vision changes. Summary  The third trimester is from week 28 through week 40, months 7 through 9. The third trimester is a time when the unborn baby (fetus) is growing rapidly.  During the third trimester, your discomfort may increase as you and your baby continue to gain weight. You may have abdominal, leg, and back pain, sleeping problems, and an increased need to urinate.  During the third trimester your breasts will keep growing and they will continue to become tender. A yellow fluid (colostrum) may leak from your breasts. This is the first milk you are producing for your baby.  False labor is a condition in which you feel small, irregular tightenings of the muscles in the womb (contractions) that eventually go away. These are called Braxton Hicks contractions. Contractions may last for hours, days, or even weeks before true labor sets in.  Signs of labor can include: abdominal cramps; regular contractions that start at 10 minutes apart and become stronger and more frequent with time; watery or bloody mucus discharge that comes from the vagina; increased pelvic pressure and dull back pain; and leaking of amniotic fluid. This information is not intended to replace advice  given to you by your health care provider. Make sure you discuss any questions you have with your health care provider. Document Released: 01/22/2001 Document Revised: 07/06/2015 Document Reviewed: 03/31/2012 Elsevier Interactive Patient Education  2017 [employer] Inc.  

## 2016-05-16 NOTE — Progress Notes (Signed)
   PRENATAL VISIT NOTE  Subjective:  Casey Lang is a 36 y.o. G2P0010 at [redacted]w[redacted]d being seen today for ongoing prenatal care.  She is currently monitored for the following issues for this high-risk pregnancy and has Bipolar disorder, curr episode mixed, severe, with psychotic features ([place]); Hyperprolactinemia ([place]); PTSD (post-traumatic stress disorder); GAD (generalized anxiety disorder); Bipolar I disorder, most recent episode (or current) manic ([place]); Supervision of high risk pregnancy, antepartum; Pseudoseizures; AMA (advanced maternal age) multigravida 61+; Conversion disorder with attacks or seizures; GBS (group B Streptococcus carrier), +RV culture, currently pregnant; and Gestational hypertension, third trimester on her problem list.  Patient reports no complaints.  Contractions: Not present. Vag. Bleeding: None.  Movement: Present. Denies leaking of fluid.   The following portions of the patient's history were reviewed and updated as appropriate: allergies, current medications, past family history, past medical history, past social history, past surgical history and problem list. Problem list updated.  Objective:   Vitals:   05/16/16 1014  BP: (!) 137/93  Pulse: 97  Weight: 269 lb (122 kg)    Fetal Status: Fetal Heart Rate (bpm): 144   Movement: Present     General:  Alert, oriented and cooperative. Patient is in no acute distress.  Skin: Skin is warm and dry. No rash noted.   Cardiovascular: Normal heart rate noted  Respiratory: Normal respiratory effort, no problems with respiration noted  Abdomen: Soft, gravid, appropriate for gestational age. Pain/Pressure: Present     Pelvic:  Cervical exam deferred        Extremities: Normal range of motion.  Edema: Trace  Mental Status: Normal mood and affect. Normal behavior. Normal judgment and thought content.   Assessment and Plan:  Pregnancy: G2P0010 at [redacted]w[redacted]d  1. Supervision of high risk pregnancy, antepartum  - CBC - HIV  antibody - RPR - Glucose Tolerance, 2 Hours w/1 Hour Gestational hyppertension continue present meds but weekly f/u Preterm labor symptoms and general obstetric precautions including but not limited to vaginal bleeding, contractions, leaking of fluid and fetal movement were reviewed in detail with the patient. Please refer to After Visit Summary for other counseling recommendations.  Return in about 1 week (around 05/23/2016).   Woodroe Mode, MD

## 2016-05-16 NOTE — Progress Notes (Signed)
Pt presents for ROB and 2 gtt, has questions about BP today. She is currently taking Labetalol 200 mg BID. Tdap given today R Del w/o complaints.

## 2016-05-17 LAB — CBC
HEMATOCRIT: 30.7 % — AB (ref 34.0–46.6)
HEMOGLOBIN: 9.8 g/dL — AB (ref 11.1–15.9)
MCH: 28.3 pg (ref 26.6–33.0)
MCHC: 31.9 g/dL (ref 31.5–35.7)
MCV: 89 fL (ref 79–97)
Platelets: 262 10*3/uL (ref 150–379)
RBC: 3.46 x10E6/uL — AB (ref 3.77–5.28)
RDW: 14.8 % (ref 12.3–15.4)
WBC: 6.1 10*3/uL (ref 3.4–10.8)

## 2016-05-17 LAB — HIV ANTIBODY (ROUTINE TESTING W REFLEX): HIV SCREEN 4TH GENERATION: NONREACTIVE

## 2016-05-17 LAB — RPR: RPR: NONREACTIVE

## 2016-05-17 LAB — GLUCOSE TOLERANCE, 2 HOURS W/ 1HR
GLUCOSE, 2 HOUR: 61 mg/dL — AB (ref 65–152)
GLUCOSE, FASTING: 86 mg/dL (ref 65–91)
Glucose, 1 hour: 140 mg/dL (ref 65–179)

## 2016-05-23 ENCOUNTER — Encounter: Payer: Self-pay | Admitting: Obstetrics

## 2016-05-23 ENCOUNTER — Ambulatory Visit (INDEPENDENT_AMBULATORY_CARE_PROVIDER_SITE_OTHER): Payer: Medicaid Other | Admitting: Obstetrics

## 2016-05-23 DIAGNOSIS — O139 Gestational [pregnancy-induced] hypertension without significant proteinuria, unspecified trimester: Secondary | ICD-10-CM

## 2016-05-23 DIAGNOSIS — O133 Gestational [pregnancy-induced] hypertension without significant proteinuria, third trimester: Secondary | ICD-10-CM

## 2016-05-23 DIAGNOSIS — O09529 Supervision of elderly multigravida, unspecified trimester: Secondary | ICD-10-CM

## 2016-05-23 DIAGNOSIS — O09523 Supervision of elderly multigravida, third trimester: Secondary | ICD-10-CM

## 2016-05-23 NOTE — Progress Notes (Signed)
Subjective:  Casey Lang is a 36 y.o. G2P0010 at [redacted]w[redacted]d being seen today for ongoing prenatal care.  She is currently monitored for the following issues for this high-risk pregnancy and has Bipolar disorder, curr episode mixed, severe, with psychotic features ([place]); Hyperprolactinemia ([place]); PTSD (post-traumatic stress disorder); GAD (generalized anxiety disorder); Bipolar I disorder, most recent episode (or current) manic ([place]); Supervision of high risk pregnancy, antepartum; Pseudoseizures; AMA (advanced maternal age) multigravida 28+; Conversion disorder with attacks or seizures; GBS (group B Streptococcus carrier), +RV culture, currently pregnant; and Gestational hypertension, third trimester on her problem list.  Patient reports no complaints.  Contractions: Not present. Vag. Bleeding: None.  Movement: Present. Denies leaking of fluid.   The following portions of the patient's history were reviewed and updated as appropriate: allergies, current medications, past family history, past medical history, past social history, past surgical history and problem list. Problem list updated.  Objective:   Vitals:   05/23/16 1401  BP: (!) 141/94  Pulse: 88  Weight: 271 lb (122.9 kg)    Fetal Status: Fetal Heart Rate (bpm): 140   Movement: Present     General:  Alert, oriented and cooperative. Patient is in no acute distress.  Skin: Skin is warm and dry. No rash noted.   Cardiovascular: Normal heart rate noted  Respiratory: Normal respiratory effort, no problems with respiration noted  Abdomen: Soft, gravid, appropriate for gestational age. Pain/Pressure: Absent     Pelvic:  Cervical exam deferred        Extremities: Normal range of motion.  Edema: Mild pitting, slight indentation  Mental Status: Normal mood and affect. Normal behavior. Normal judgment and thought content.   Urinalysis:      Assessment and Plan:  1. Pregnancy: G2P0010 at [redacted]w[redacted]d 2. AMA 3. Gestational Hypertension.  Stable  on Labetalol 200 mg bid  There are no diagnoses linked to this encounter. Preterm labor symptoms and general obstetric precautions including but not limited to vaginal bleeding, contractions, leaking of fluid and fetal movement were reviewed in detail with the patient. Please refer to After Visit Summary for other counseling recommendations.  Return in 1 week (on 05/30/2016).   Shelly Bombard, MDPatient ID: [name], female   DOB: 1980/06/29, 36 y.o.   MRN: [id number]

## 2016-05-29 ENCOUNTER — Ambulatory Visit (INDEPENDENT_AMBULATORY_CARE_PROVIDER_SITE_OTHER): Payer: Medicaid Other | Admitting: Obstetrics & Gynecology

## 2016-05-29 VITALS — BP 133/90 | HR 93 | Wt 276.2 lb

## 2016-05-29 DIAGNOSIS — O133 Gestational [pregnancy-induced] hypertension without significant proteinuria, third trimester: Secondary | ICD-10-CM

## 2016-05-29 DIAGNOSIS — O0993 Supervision of high risk pregnancy, unspecified, third trimester: Secondary | ICD-10-CM

## 2016-05-29 DIAGNOSIS — O0990 Supervision of high risk pregnancy, unspecified, unspecified trimester: Secondary | ICD-10-CM

## 2016-05-29 MED ORDER — LABETALOL HCL 200 MG PO TABS: 200.0000 mg | ORAL_TABLET | Freq: Three times a day (TID) | ORAL | 2 refills | Status: DC

## 2016-05-29 NOTE — Progress Notes (Signed)
   PRENATAL VISIT NOTE  Subjective:  Casey Lang is a 36 y.o. G2P0010 at [redacted]w[redacted]d being seen today for ongoing prenatal care.  She is currently monitored for the following issues for this high-risk pregnancy and has Bipolar disorder, curr episode mixed, severe, with psychotic features ([place]); Hyperprolactinemia ([place]); PTSD (post-traumatic stress disorder); GAD (generalized anxiety disorder); Bipolar I disorder, most recent episode (or current) manic ([place]); Supervision of high risk pregnancy, antepartum; Pseudoseizures; AMA (advanced maternal age) multigravida 61+; Conversion disorder with attacks or seizures; GBS (group B Streptococcus carrier), +RV culture, currently pregnant; and Gestational hypertension, third trimester on her problem list.  Patient reports BP was elevated at home.  Contractions: Not present. Vag. Bleeding: None.  Movement: Present. Denies leaking of fluid.   The following portions of the patient's history were reviewed and updated as appropriate: allergies, current medications, past family history, past medical history, past social history, past surgical history and problem list. Problem list updated.  Objective:   Vitals:   05/29/16 1015  BP: 133/90  Pulse: 93  Weight: 276 lb 3.2 oz (125.3 kg)    Fetal Status: Fetal Heart Rate (bpm): 140   Movement: Present     General:  Alert, oriented and cooperative. Patient is in no acute distress.  Skin: Skin is warm and dry. No rash noted.   Cardiovascular: Normal heart rate noted  Respiratory: Normal respiratory effort, no problems with respiration noted  Abdomen: Soft, gravid, appropriate for gestational age. Pain/Pressure: Absent     Pelvic:  Cervical exam deferred        Extremities: Normal range of motion.  Edema: None  Mental Status: Normal mood and affect. Normal behavior. Normal judgment and thought content.   Assessment and Plan:  Pregnancy: G2P0010 at [redacted]w[redacted]d  1. Supervision of high risk pregnancy,  antepartum Increase labetalol to 200 mg TID - labetalol (NORMODYNE) 200 MG tablet; Take 1 tablet (200 mg total) by mouth 3 (three) times daily.  Dispense: 90 tablet; Refill: 2  2. Gestational hypertension, third trimester Growth [place] in 1 week  Preterm labor symptoms and general obstetric precautions including but not limited to vaginal bleeding, contractions, leaking of fluid and fetal movement were reviewed in detail with the patient. Please refer to After Visit Summary for other counseling recommendations.  Return in about 1 week (around 06/05/2016).   Woodroe Mode, MD

## 2016-05-29 NOTE — Progress Notes (Signed)
Patient is concerned about her BP- she did take 2 BP pills today.

## 2016-05-29 NOTE — Patient Instructions (Signed)
Third Trimester of Pregnancy The third trimester is from week 28 through week 40 (months 7 through 9). The third trimester is a time when the unborn baby (fetus) is growing rapidly. At the end of the ninth month, the fetus is about 20 inches in length and weighs 6-10 pounds. Body changes during your third trimester Your body will continue to go through many changes during pregnancy. The changes vary from woman to woman. During the third trimester:  Your weight will continue to increase. You can expect to gain 25-35 pounds (11-16 kg) by the end of the pregnancy.  You may begin to get stretch marks on your hips, abdomen, and breasts.  You may urinate more often because the fetus is moving lower into your pelvis and pressing on your bladder.  You may develop or continue to have heartburn. This is caused by increased hormones that slow down muscles in the digestive tract.  You may develop or continue to have constipation because increased hormones slow digestion and cause the muscles that push waste through your intestines to relax.  You may develop hemorrhoids. These are swollen veins (varicose veins) in the rectum that can itch or be painful.  You may develop swollen, bulging veins (varicose veins) in your legs.  You may have increased body aches in the pelvis, back, or thighs. This is due to weight gain and increased hormones that are relaxing your joints.  You may have changes in your hair. These can include thickening of your hair, rapid growth, and changes in texture. Some women also have hair loss during or after pregnancy, or hair that feels dry or thin. Your hair will most likely return to normal after your baby is born.  Your breasts will continue to grow and they will continue to become tender. A yellow fluid (colostrum) may leak from your breasts. This is the first milk you are producing for your baby.  Your belly button may stick out.  You may notice more swelling in your hands,  face, or ankles.  You may have increased tingling or numbness in your hands, arms, and legs. The skin on your belly may also feel numb.  You may feel short of breath because of your expanding uterus.  You may have more problems sleeping. This can be caused by the size of your belly, increased need to urinate, and an increase in your body's metabolism.  You may notice the fetus "dropping," or moving lower in your abdomen (lightening).  You may have increased vaginal discharge.  You may notice your joints feel loose and you may have pain around your pelvic bone.  What to expect at prenatal visits You will have prenatal exams every 2 weeks until week 36. Then you will have weekly prenatal exams. During a routine prenatal visit:  You will be weighed to make sure you and the baby are growing normally.  Your blood pressure will be taken.  Your abdomen will be measured to track your baby's growth.  The fetal heartbeat will be listened to.  Any test results from the previous visit will be discussed.  You may have a cervical check near your due date to see if your cervix has softened or thinned (effaced).  You will be tested for Group B streptococcus. This happens between 35 and 37 weeks.  Your health care provider may ask you:  What your birth plan is.  How you are feeling.  If you are feeling the baby move.  If you have had   any abnormal symptoms, such as leaking fluid, bleeding, severe headaches, or abdominal cramping.  If you are using any tobacco products, including cigarettes, chewing tobacco, and electronic cigarettes.  If you have any questions.  Other tests or screenings that may be performed during your third trimester include:  Blood tests that check for low iron levels (anemia).  Fetal testing to check the health, activity level, and growth of the fetus. Testing is done if you have certain medical conditions or if there are problems during the  pregnancy.  Nonstress test (NST). This test checks the health of your baby to make sure there are no signs of problems, such as the baby not getting enough oxygen. During this test, a belt is placed around your belly. The baby is made to move, and its heart rate is monitored during movement.  What is false labor? False labor is a condition in which you feel small, irregular tightenings of the muscles in the womb (contractions) that usually go away with rest, changing position, or drinking water. These are called Braxton Hicks contractions. Contractions may last for hours, days, or even weeks before true labor sets in. If contractions come at regular intervals, become more frequent, increase in intensity, or become painful, you should see your health care provider. What are the signs of labor?  Abdominal cramps.  Regular contractions that start at 10 minutes apart and become stronger and more frequent with time.  Contractions that start on the top of the uterus and spread down to the lower abdomen and back.  Increased pelvic pressure and dull back pain.  A watery or bloody mucus discharge that comes from the vagina.  Leaking of amniotic fluid. This is also known as your "water breaking." It could be a slow trickle or a gush. Let your health care provider know if it has a color or strange odor. If you have any of these signs, call your health care provider right away, even if it is before your due date. Follow these instructions at home: Medicines  Follow your health care provider's instructions regarding medicine use. Specific medicines may be either safe or unsafe to take during pregnancy.  Take a prenatal vitamin that contains at least 600 micrograms (mcg) of folic acid.  If you develop constipation, try taking a stool softener if your health care provider approves. Eating and drinking  Eat a balanced diet that includes fresh fruits and vegetables, whole grains, good sources of protein  such as meat, eggs, or tofu, and low-fat dairy. Your health care provider will help you determine the amount of weight gain that is right for you.  Avoid raw meat and uncooked cheese. These carry germs that can cause birth defects in the baby.  If you have low calcium intake from food, talk to your health care provider about whether you should take a daily calcium supplement.  Eat four or five small meals rather than three large meals a day.  Limit foods that are high in fat and processed sugars, such as fried and sweet foods.  To prevent constipation: ? Drink enough fluid to keep your urine clear or pale yellow. ? Eat foods that are high in fiber, such as fresh fruits and vegetables, whole grains, and beans. Activity  Exercise only as directed by your health care provider. Most women can continue their usual exercise routine during pregnancy. Try to exercise for 30 minutes at least 5 days a week. Stop exercising if you experience uterine contractions.  Avoid heavy   lifting.  Do not exercise in extreme heat or humidity, or at high altitudes.  Wear low-heel, comfortable shoes.  Practice good posture.  You may continue to have sex unless your health care provider tells you otherwise. Relieving pain and discomfort  Take frequent breaks and rest with your legs elevated if you have leg cramps or low back pain.  Take warm sitz baths to soothe any pain or discomfort caused by hemorrhoids. Use hemorrhoid cream if your health care provider approves.  Wear a good support bra to prevent discomfort from breast tenderness.  If you develop varicose veins: ? Wear support pantyhose or compression stockings as told by your healthcare provider. ? Elevate your feet for 15 minutes, 3-4 times a day. Prenatal care  Write down your questions. Take them to your prenatal visits.  Keep all your prenatal visits as told by your health care provider. This is important. Safety  Wear your seat belt at  all times when driving.  Make a list of emergency phone numbers, including numbers for family, friends, the hospital, and police and fire departments. General instructions  Avoid cat litter boxes and soil used by cats. These carry germs that can cause birth defects in the baby. If you have a cat, ask someone to clean the litter box for you.  Do not travel far distances unless it is absolutely necessary and only with the approval of your health care provider.  Do not use hot tubs, steam rooms, or saunas.  Do not drink alcohol.  Do not use any products that contain nicotine or tobacco, such as cigarettes and e-cigarettes. If you need help quitting, ask your health care provider.  Do not use any medicinal herbs or unprescribed drugs. These chemicals affect the formation and growth of the baby.  Do not douche or use tampons or scented sanitary pads.  Do not cross your legs for long periods of time.  To prepare for the arrival of your baby: ? Take prenatal classes to understand, practice, and ask questions about labor and delivery. ? Make a trial run to the hospital. ? Visit the hospital and tour the maternity area. ? Arrange for maternity or paternity leave through employers. ? Arrange for family and friends to take care of pets while you are in the hospital. ? Purchase a rear-facing car seat and make sure you know how to install it in your car. ? Pack your hospital bag. ? Prepare the baby's nursery. Make sure to remove all pillows and stuffed animals from the baby's crib to prevent suffocation.  Visit your dentist if you have not gone during your pregnancy. Use a soft toothbrush to brush your teeth and be gentle when you floss. Contact a health care provider if:  You are unsure if you are in labor or if your water has broken.  You become dizzy.  You have mild pelvic cramps, pelvic pressure, or nagging pain in your abdominal area.  You have lower back pain.  You have persistent  nausea, vomiting, or diarrhea.  You have an unusual or bad smelling vaginal discharge.  You have pain when you urinate. Get help right away if:  Your water breaks before 37 weeks.  You have regular contractions less than 5 minutes apart before 37 weeks.  You have a fever.  You are leaking fluid from your vagina.  You have spotting or bleeding from your vagina.  You have severe abdominal pain or cramping.  You have rapid weight loss or weight gain.    You have shortness of breath with chest pain.  You notice sudden or extreme swelling of your face, hands, ankles, feet, or legs.  Your baby makes fewer than 10 movements in 2 hours.  You have severe headaches that do not go away when you take medicine.  You have vision changes. Summary  The third trimester is from week 28 through week 40, months 7 through 9. The third trimester is a time when the unborn baby (fetus) is growing rapidly.  During the third trimester, your discomfort may increase as you and your baby continue to gain weight. You may have abdominal, leg, and back pain, sleeping problems, and an increased need to urinate.  During the third trimester your breasts will keep growing and they will continue to become tender. A yellow fluid (colostrum) may leak from your breasts. This is the first milk you are producing for your baby.  False labor is a condition in which you feel small, irregular tightenings of the muscles in the womb (contractions) that eventually go away. These are called Braxton Hicks contractions. Contractions may last for hours, days, or even weeks before true labor sets in.  Signs of labor can include: abdominal cramps; regular contractions that start at 10 minutes apart and become stronger and more frequent with time; watery or bloody mucus discharge that comes from the vagina; increased pelvic pressure and dull back pain; and leaking of amniotic fluid. This information is not intended to replace advice  given to you by your health care provider. Make sure you discuss any questions you have with your health care provider. Document Released: 01/22/2001 Document Revised: 07/06/2015 Document Reviewed: 03/31/2012 Elsevier Interactive Patient Education  2017 [employer] Inc.  

## 2016-06-05 ENCOUNTER — Ambulatory Visit (INDEPENDENT_AMBULATORY_CARE_PROVIDER_SITE_OTHER): Payer: Medicaid Other | Admitting: Obstetrics & Gynecology

## 2016-06-05 VITALS — BP 121/84 | HR 77 | Wt 278.9 lb

## 2016-06-05 DIAGNOSIS — O0990 Supervision of high risk pregnancy, unspecified, unspecified trimester: Secondary | ICD-10-CM

## 2016-06-05 DIAGNOSIS — O133 Gestational [pregnancy-induced] hypertension without significant proteinuria, third trimester: Secondary | ICD-10-CM

## 2016-06-05 DIAGNOSIS — O0993 Supervision of high risk pregnancy, unspecified, third trimester: Secondary | ICD-10-CM | POA: Diagnosis not present

## 2016-06-05 NOTE — Progress Notes (Signed)
Patient reports good fetal movement, denies pain/contractions.

## 2016-06-05 NOTE — Patient Instructions (Signed)
Third Trimester of Pregnancy The third trimester is from week 28 through week 40 (months 7 through 9). The third trimester is a time when the unborn baby (fetus) is growing rapidly. At the end of the ninth month, the fetus is about 20 inches in length and weighs 6-10 pounds. Body changes during your third trimester Your body will continue to go through many changes during pregnancy. The changes vary from woman to woman. During the third trimester:  Your weight will continue to increase. You can expect to gain 25-35 pounds (11-16 kg) by the end of the pregnancy.  You may begin to get stretch marks on your hips, abdomen, and breasts.  You may urinate more often because the fetus is moving lower into your pelvis and pressing on your bladder.  You may develop or continue to have heartburn. This is caused by increased hormones that slow down muscles in the digestive tract.  You may develop or continue to have constipation because increased hormones slow digestion and cause the muscles that push waste through your intestines to relax.  You may develop hemorrhoids. These are swollen veins (varicose veins) in the rectum that can itch or be painful.  You may develop swollen, bulging veins (varicose veins) in your legs.  You may have increased body aches in the pelvis, back, or thighs. This is due to weight gain and increased hormones that are relaxing your joints.  You may have changes in your hair. These can include thickening of your hair, rapid growth, and changes in texture. Some women also have hair loss during or after pregnancy, or hair that feels dry or thin. Your hair will most likely return to normal after your baby is born.  Your breasts will continue to grow and they will continue to become tender. A yellow fluid (colostrum) may leak from your breasts. This is the first milk you are producing for your baby.  Your belly button may stick out.  You may notice more swelling in your hands,  face, or ankles.  You may have increased tingling or numbness in your hands, arms, and legs. The skin on your belly may also feel numb.  You may feel short of breath because of your expanding uterus.  You may have more problems sleeping. This can be caused by the size of your belly, increased need to urinate, and an increase in your body's metabolism.  You may notice the fetus "dropping," or moving lower in your abdomen (lightening).  You may have increased vaginal discharge.  You may notice your joints feel loose and you may have pain around your pelvic bone.  What to expect at prenatal visits You will have prenatal exams every 2 weeks until week 36. Then you will have weekly prenatal exams. During a routine prenatal visit:  You will be weighed to make sure you and the baby are growing normally.  Your blood pressure will be taken.  Your abdomen will be measured to track your baby's growth.  The fetal heartbeat will be listened to.  Any test results from the previous visit will be discussed.  You may have a cervical check near your due date to see if your cervix has softened or thinned (effaced).  You will be tested for Group B streptococcus. This happens between 35 and 37 weeks.  Your health care provider may ask you:  What your birth plan is.  How you are feeling.  If you are feeling the baby move.  If you have had   any abnormal symptoms, such as leaking fluid, bleeding, severe headaches, or abdominal cramping.  If you are using any tobacco products, including cigarettes, chewing tobacco, and electronic cigarettes.  If you have any questions.  Other tests or screenings that may be performed during your third trimester include:  Blood tests that check for low iron levels (anemia).  Fetal testing to check the health, activity level, and growth of the fetus. Testing is done if you have certain medical conditions or if there are problems during the  pregnancy.  Nonstress test (NST). This test checks the health of your baby to make sure there are no signs of problems, such as the baby not getting enough oxygen. During this test, a belt is placed around your belly. The baby is made to move, and its heart rate is monitored during movement.  What is false labor? False labor is a condition in which you feel small, irregular tightenings of the muscles in the womb (contractions) that usually go away with rest, changing position, or drinking water. These are called Braxton Hicks contractions. Contractions may last for hours, days, or even weeks before true labor sets in. If contractions come at regular intervals, become more frequent, increase in intensity, or become painful, you should see your health care provider. What are the signs of labor?  Abdominal cramps.  Regular contractions that start at 10 minutes apart and become stronger and more frequent with time.  Contractions that start on the top of the uterus and spread down to the lower abdomen and back.  Increased pelvic pressure and dull back pain.  A watery or bloody mucus discharge that comes from the vagina.  Leaking of amniotic fluid. This is also known as your "water breaking." It could be a slow trickle or a gush. Let your health care provider know if it has a color or strange odor. If you have any of these signs, call your health care provider right away, even if it is before your due date. Follow these instructions at home: Medicines  Follow your health care provider's instructions regarding medicine use. Specific medicines may be either safe or unsafe to take during pregnancy.  Take a prenatal vitamin that contains at least 600 micrograms (mcg) of folic acid.  If you develop constipation, try taking a stool softener if your health care provider approves. Eating and drinking  Eat a balanced diet that includes fresh fruits and vegetables, whole grains, good sources of protein  such as meat, eggs, or tofu, and low-fat dairy. Your health care provider will help you determine the amount of weight gain that is right for you.  Avoid raw meat and uncooked cheese. These carry germs that can cause birth defects in the baby.  If you have low calcium intake from food, talk to your health care provider about whether you should take a daily calcium supplement.  Eat four or five small meals rather than three large meals a day.  Limit foods that are high in fat and processed sugars, such as fried and sweet foods.  To prevent constipation: ? Drink enough fluid to keep your urine clear or pale yellow. ? Eat foods that are high in fiber, such as fresh fruits and vegetables, whole grains, and beans. Activity  Exercise only as directed by your health care provider. Most women can continue their usual exercise routine during pregnancy. Try to exercise for 30 minutes at least 5 days a week. Stop exercising if you experience uterine contractions.  Avoid heavy   lifting.  Do not exercise in extreme heat or humidity, or at high altitudes.  Wear low-heel, comfortable shoes.  Practice good posture.  You may continue to have sex unless your health care provider tells you otherwise. Relieving pain and discomfort  Take frequent breaks and rest with your legs elevated if you have leg cramps or low back pain.  Take warm sitz baths to soothe any pain or discomfort caused by hemorrhoids. Use hemorrhoid cream if your health care provider approves.  Wear a good support bra to prevent discomfort from breast tenderness.  If you develop varicose veins: ? Wear support pantyhose or compression stockings as told by your healthcare provider. ? Elevate your feet for 15 minutes, 3-4 times a day. Prenatal care  Write down your questions. Take them to your prenatal visits.  Keep all your prenatal visits as told by your health care provider. This is important. Safety  Wear your seat belt at  all times when driving.  Make a list of emergency phone numbers, including numbers for family, friends, the hospital, and police and fire departments. General instructions  Avoid cat litter boxes and soil used by cats. These carry germs that can cause birth defects in the baby. If you have a cat, ask someone to clean the litter box for you.  Do not travel far distances unless it is absolutely necessary and only with the approval of your health care provider.  Do not use hot tubs, steam rooms, or saunas.  Do not drink alcohol.  Do not use any products that contain nicotine or tobacco, such as cigarettes and e-cigarettes. If you need help quitting, ask your health care provider.  Do not use any medicinal herbs or unprescribed drugs. These chemicals affect the formation and growth of the baby.  Do not douche or use tampons or scented sanitary pads.  Do not cross your legs for long periods of time.  To prepare for the arrival of your baby: ? Take prenatal classes to understand, practice, and ask questions about labor and delivery. ? Make a trial run to the hospital. ? Visit the hospital and tour the maternity area. ? Arrange for maternity or paternity leave through employers. ? Arrange for family and friends to take care of pets while you are in the hospital. ? Purchase a rear-facing car seat and make sure you know how to install it in your car. ? Pack your hospital bag. ? Prepare the baby's nursery. Make sure to remove all pillows and stuffed animals from the baby's crib to prevent suffocation.  Visit your dentist if you have not gone during your pregnancy. Use a soft toothbrush to brush your teeth and be gentle when you floss. Contact a health care provider if:  You are unsure if you are in labor or if your water has broken.  You become dizzy.  You have mild pelvic cramps, pelvic pressure, or nagging pain in your abdominal area.  You have lower back pain.  You have persistent  nausea, vomiting, or diarrhea.  You have an unusual or bad smelling vaginal discharge.  You have pain when you urinate. Get help right away if:  Your water breaks before 37 weeks.  You have regular contractions less than 5 minutes apart before 37 weeks.  You have a fever.  You are leaking fluid from your vagina.  You have spotting or bleeding from your vagina.  You have severe abdominal pain or cramping.  You have rapid weight loss or weight gain.    You have shortness of breath with chest pain.  You notice sudden or extreme swelling of your face, hands, ankles, feet, or legs.  Your baby makes fewer than 10 movements in 2 hours.  You have severe headaches that do not go away when you take medicine.  You have vision changes. Summary  The third trimester is from week 28 through week 40, months 7 through 9. The third trimester is a time when the unborn baby (fetus) is growing rapidly.  During the third trimester, your discomfort may increase as you and your baby continue to gain weight. You may have abdominal, leg, and back pain, sleeping problems, and an increased need to urinate.  During the third trimester your breasts will keep growing and they will continue to become tender. A yellow fluid (colostrum) may leak from your breasts. This is the first milk you are producing for your baby.  False labor is a condition in which you feel small, irregular tightenings of the muscles in the womb (contractions) that eventually go away. These are called Braxton Hicks contractions. Contractions may last for hours, days, or even weeks before true labor sets in.  Signs of labor can include: abdominal cramps; regular contractions that start at 10 minutes apart and become stronger and more frequent with time; watery or bloody mucus discharge that comes from the vagina; increased pelvic pressure and dull back pain; and leaking of amniotic fluid. This information is not intended to replace advice  given to you by your health care provider. Make sure you discuss any questions you have with your health care provider. Document Released: 01/22/2001 Document Revised: 07/06/2015 Document Reviewed: 03/31/2012 Elsevier Interactive Patient Education  2017 [employer] Inc.  

## 2016-06-05 NOTE — Progress Notes (Signed)
   PRENATAL VISIT NOTE  Subjective:  Casey Lang is a 36 y.o. G2P0010 at [redacted]w[redacted]d being seen today for ongoing prenatal care.  She is currently monitored for the following issues for this high-risk pregnancy and has Bipolar disorder, curr episode mixed, severe, with psychotic features ([place]); Hyperprolactinemia ([place]); PTSD (post-traumatic stress disorder); GAD (generalized anxiety disorder); Bipolar I disorder, most recent episode (or current) manic ([place]); Supervision of high risk pregnancy, antepartum; Pseudoseizures; AMA (advanced maternal age) multigravida 54+; Conversion disorder with attacks or seizures; GBS (group B Streptococcus carrier), +RV culture, currently pregnant; and Gestational hypertension, third trimester on her problem list.  Patient reports no complaints.  Contractions: Not present. Vag. Bleeding: None.  Movement: Present. Denies leaking of fluid.   The following portions of the patient's history were reviewed and updated as appropriate: allergies, current medications, past family history, past medical history, past social history, past surgical history and problem list. Problem list updated.  Objective:   Vitals:   06/05/16 0928  BP: 121/84  Pulse: 77  Weight: 126.5 kg (278 lb 14.4 oz)    Fetal Status: Fetal Heart Rate (bpm): 136   Movement: Present     General:  Alert, oriented and cooperative. Patient is in no acute distress.  Skin: Skin is warm and dry. No rash noted.   Cardiovascular: Normal heart rate noted  Respiratory: Normal respiratory effort, no problems with respiration noted  Abdomen: Soft, gravid, appropriate for gestational age. Pain/Pressure: Absent     Pelvic:  Cervical exam deferred        Extremities: Normal range of motion.  Edema: Trace  Mental Status: Normal mood and affect. Normal behavior. Normal judgment and thought content.   Assessment and Plan:  Pregnancy: G2P0010 at [redacted]w[redacted]d  1. Supervision of high risk pregnancy, antepartum [place] f/u  tomorrow  2. Gestational hypertension, third trimester BP nl today but reports high and low values at home, will not change her labetalol dose  Preterm labor symptoms and general obstetric precautions including but not limited to vaginal bleeding, contractions, leaking of fluid and fetal movement were reviewed in detail with the patient. Please refer to After Visit Summary for other counseling recommendations.  Return in about 1 week (around 06/12/2016) for visit and NST.   Woodroe Mode, MD

## 2016-06-06 ENCOUNTER — Ambulatory Visit (HOSPITAL_COMMUNITY)
Admission: RE | Admit: 2016-06-06 | Discharge: 2016-06-06 | Disposition: A | Discharge status: Home / Self Care | Payer: Medicaid Other | Source: Ambulatory Visit | Attending: Certified Nurse Midwife | Admitting: Certified Nurse Midwife

## 2016-06-06 ENCOUNTER — Other Ambulatory Visit (HOSPITAL_COMMUNITY): Payer: Self-pay | Admitting: *Deleted

## 2016-06-06 ENCOUNTER — Encounter (HOSPITAL_COMMUNITY): Payer: Self-pay

## 2016-06-06 DIAGNOSIS — O10919 Unspecified pre-existing hypertension complicating pregnancy, unspecified trimester: Secondary | ICD-10-CM

## 2016-06-06 DIAGNOSIS — O99213 Obesity complicating pregnancy, third trimester: Secondary | ICD-10-CM | POA: Insufficient documentation

## 2016-06-06 DIAGNOSIS — O133 Gestational [pregnancy-induced] hypertension without significant proteinuria, third trimester: Secondary | ICD-10-CM | POA: Diagnosis not present

## 2016-06-06 DIAGNOSIS — Z3A30 30 weeks gestation of pregnancy: Secondary | ICD-10-CM | POA: Diagnosis not present

## 2016-06-06 DIAGNOSIS — F319 Bipolar disorder, unspecified: Secondary | ICD-10-CM | POA: Insufficient documentation

## 2016-06-06 DIAGNOSIS — O99343 Other mental disorders complicating pregnancy, third trimester: Secondary | ICD-10-CM | POA: Diagnosis not present

## 2016-06-06 DIAGNOSIS — O09523 Supervision of elderly multigravida, third trimester: Secondary | ICD-10-CM | POA: Insufficient documentation

## 2016-06-06 DIAGNOSIS — O0990 Supervision of high risk pregnancy, unspecified, unspecified trimester: Secondary | ICD-10-CM

## 2016-06-12 ENCOUNTER — Other Ambulatory Visit: Payer: Self-pay | Admitting: Certified Nurse Midwife

## 2016-06-12 ENCOUNTER — Ambulatory Visit (INDEPENDENT_AMBULATORY_CARE_PROVIDER_SITE_OTHER): Payer: Medicaid Other | Admitting: Obstetrics and Gynecology

## 2016-06-12 ENCOUNTER — Other Ambulatory Visit (HOSPITAL_COMMUNITY): Payer: Self-pay | Admitting: Maternal and Fetal Medicine

## 2016-06-12 ENCOUNTER — Inpatient Hospital Stay (HOSPITAL_COMMUNITY)
Admission: AD | Admit: 2016-06-12 | Discharge: 2016-06-12 | Disposition: A | Discharge status: Home / Self Care | Payer: Medicaid Other | Source: Ambulatory Visit | Attending: Obstetrics & Gynecology | Admitting: Obstetrics & Gynecology

## 2016-06-12 ENCOUNTER — Ambulatory Visit (HOSPITAL_COMMUNITY)
Admission: RE | Admit: 2016-06-12 | Discharge: 2016-06-12 | Disposition: A | Discharge status: Home / Self Care | Payer: Medicaid Other | Source: Ambulatory Visit | Attending: Obstetrics and Gynecology | Admitting: Obstetrics and Gynecology

## 2016-06-12 ENCOUNTER — Encounter (HOSPITAL_COMMUNITY): Payer: Self-pay | Admitting: *Deleted

## 2016-06-12 VITALS — BP 138/94 | HR 90 | Wt 280.6 lb

## 2016-06-12 DIAGNOSIS — Z7982 Long term (current) use of aspirin: Secondary | ICD-10-CM | POA: Insufficient documentation

## 2016-06-12 DIAGNOSIS — O10919 Unspecified pre-existing hypertension complicating pregnancy, unspecified trimester: Secondary | ICD-10-CM

## 2016-06-12 DIAGNOSIS — O10913 Unspecified pre-existing hypertension complicating pregnancy, third trimester: Secondary | ICD-10-CM | POA: Diagnosis present

## 2016-06-12 DIAGNOSIS — O09523 Supervision of elderly multigravida, third trimester: Secondary | ICD-10-CM

## 2016-06-12 DIAGNOSIS — E669 Obesity, unspecified: Secondary | ICD-10-CM | POA: Diagnosis not present

## 2016-06-12 DIAGNOSIS — Z8249 Family history of ischemic heart disease and other diseases of the circulatory system: Secondary | ICD-10-CM | POA: Insufficient documentation

## 2016-06-12 DIAGNOSIS — Z79899 Other long term (current) drug therapy: Secondary | ICD-10-CM | POA: Diagnosis not present

## 2016-06-12 DIAGNOSIS — O0993 Supervision of high risk pregnancy, unspecified, third trimester: Secondary | ICD-10-CM | POA: Diagnosis not present

## 2016-06-12 DIAGNOSIS — O99213 Obesity complicating pregnancy, third trimester: Secondary | ICD-10-CM

## 2016-06-12 DIAGNOSIS — O288 Other abnormal findings on antenatal screening of mother: Secondary | ICD-10-CM

## 2016-06-12 DIAGNOSIS — O99820 Streptococcus B carrier state complicating pregnancy: Secondary | ICD-10-CM | POA: Diagnosis not present

## 2016-06-12 DIAGNOSIS — O403XX0 Polyhydramnios, third trimester, not applicable or unspecified: Secondary | ICD-10-CM | POA: Insufficient documentation

## 2016-06-12 DIAGNOSIS — Z3A32 32 weeks gestation of pregnancy: Secondary | ICD-10-CM | POA: Insufficient documentation

## 2016-06-12 DIAGNOSIS — O09893 Supervision of other high risk pregnancies, third trimester: Secondary | ICD-10-CM | POA: Insufficient documentation

## 2016-06-12 DIAGNOSIS — R569 Unspecified convulsions: Secondary | ICD-10-CM

## 2016-06-12 DIAGNOSIS — O99343 Other mental disorders complicating pregnancy, third trimester: Secondary | ICD-10-CM

## 2016-06-12 DIAGNOSIS — F319 Bipolar disorder, unspecified: Secondary | ICD-10-CM | POA: Insufficient documentation

## 2016-06-12 DIAGNOSIS — E221 Hyperprolactinemia: Secondary | ICD-10-CM

## 2016-06-12 DIAGNOSIS — O0990 Supervision of high risk pregnancy, unspecified, unspecified trimester: Secondary | ICD-10-CM

## 2016-06-12 DIAGNOSIS — F445 Conversion disorder with seizures or convulsions: Secondary | ICD-10-CM

## 2016-06-12 DIAGNOSIS — O133 Gestational [pregnancy-induced] hypertension without significant proteinuria, third trimester: Secondary | ICD-10-CM | POA: Diagnosis not present

## 2016-06-12 DIAGNOSIS — Z833 Family history of diabetes mellitus: Secondary | ICD-10-CM | POA: Diagnosis not present

## 2016-06-12 NOTE — MAU Provider Note (Signed)
Chief Complaint:  No chief complaint on file.   First Provider Initiated Contact with Patient 06/12/16 1340     HPI: Casey Lang is a 36 y.o. G2P0010 at 70w0dwho presents to maternity admissions reporting nonreactive NST in office and BPP score of 6/8.  Denies contractions. She reports good fetal movement, denies LOF, vaginal bleeding, vaginal itching/burning, urinary symptoms, h/a, dizziness, n/v, diarrhea, constipation or fever/chills.    Other  This is a new problem. The current episode started today. The problem has been gradually improving. Pertinent negatives include no abdominal pain, chills, fever, myalgias, nausea or vomiting. Nothing aggravates the symptoms. She has tried nothing for the symptoms.   RN Note: Patient sent up from clinic for further evaluation. Has non-reactive nst; BPP 6/8.  Denies LOF or VB at this time. Denies pain. +FM  FHR 130's.  Past Medical History: Past Medical History:  Diagnosis Date  . Anxiety   . Bipolar 1 disorder ([place])   . Depression   . Mental disorder   . Right foot injury 10/09/2012   Resolved without complication    Past obstetric history: OB History  Gravida Para Term Preterm AB Living  2       1 0  SAB TAB Ectopic Multiple Live Births               # Outcome Date GA Lbr Len/2nd Weight Sex Delivery Anes PTL Lv  2 Current           1 AB 2010              Past Surgical History: Past Surgical History:  Procedure Laterality Date  . FOOT SURGERY    . LAPAROSCOPIC GASTRIC SLEEVE RESECTION  10/09/2012   Surgery was on 09/30/2011    Family History: Family History  Problem Relation Age of Onset  . Hypertension Mother   . Diabetes Mother   . Diabetes Father   . Hypertension Father   . Cancer Other   . Alzheimer's disease Other   . Aneurysm Other   . Anxiety disorder Other   . Depression Other   . Seizures Neg Hx     Social History: Social History  Substance Use Topics  . Smoking status: Never Smoker  .  Smokeless tobacco: Never Used  . Alcohol use No    Allergies:  Allergies  Allergen Reactions  . Morphine Rash    Other reaction(s): Other (See Comments) Other Reaction: Other reaction  . Morphine And Related     Psychotic reactions    Meds:  Prescriptions Prior to Admission  Medication Sig Dispense Refill Last Dose  . aspirin 81 MG chewable tablet Chew 1 tablet (81 mg total) by mouth daily. 60 tablet 1 Taking  . folic acid (FOLVITE) 1 MG tablet Take 4 tablets (4 mg total) by mouth daily. 120 tablet 5 Taking  . gabapentin (NEURONTIN) 300 MG capsule Take 300 mg by mouth at bedtime.   Taking  . labetalol (NORMODYNE) 200 MG tablet Take 1 tablet (200 mg total) by mouth 3 (three) times daily. 90 tablet 2 Taking  . lamoTRIgine (LAMICTAL) 150 MG tablet Take 2 tablets (300 mg total) by mouth every evening. 60 tablet 0 Taking  . Omega-3 Fatty Acids (FISH OIL PO) Take 1 capsule by mouth daily.   Taking  . Prenat-FeAsp-Meth-FA-DHA w/o A (PRENATE PIXIE) 10-0.6-0.4-200 MG CAPS Take 1 tablet by mouth daily. 30 capsule 12 Taking  . QUEtiapine (SEROQUEL) 300 MG tablet Take 600 mg by mouth  at bedtime.   Taking  . traZODone (DESYREL) 100 MG tablet Take 100 mg by mouth at bedtime.   Taking    I have reviewed patient's Past Medical Hx, Surgical Hx, Family Hx, Social Hx, medications and allergies.   ROS:  Review of Systems  Constitutional: Negative for chills and fever.  Gastrointestinal: Negative for abdominal pain, nausea and vomiting.  Musculoskeletal: Negative for myalgias.   Other systems negative  Physical Exam  No data found.  Constitutional: Well-developed, well-nourished female in no acute distress.  Cardiovascular: normal rate and rhythm Respiratory: normal effort, clear to auscultation bilaterally GI: Abd soft, non-tender, gravid appropriate for gestational age.   No rebound or guarding. MS: Extremities nontender, no edema, normal ROM Neurologic: Alert and oriented x 4.  GU: Neg  CVAT.  PELVIC EXAM:  deferred  FHT:  Baseline 140 , moderate variability, accelerations present, no decelerations Contractions:   Rare   Labs: No results found for this or any previous visit (from the past 24 hour(s)). O/Positive/-- (03/15 1138)  Imaging:  [place] Mfm Ob Follow Up  Result Date: 06/06/2016 ----------------------------------------------------------------------  OBSTETRICS REPORT                      (Signed Final 06/06/2016 12:47 pm) ---------------------------------------------------------------------- Patient Info  ID #:       [id number]                          D.O.B.:  21-Nov-1980 (35 yrs)  Name:       Casey Lang              Visit Date: 06/06/2016 10:47 am ---------------------------------------------------------------------- Performed By  Performed By:     Raquel James         Ref. Address:     [address] [place]                                                             [place]  Attending:        Abram Sander MD         Location:         [facility]  Referred By:      Morene Crocker CNM ---------------------------------------------------------------------- Orders   #  Description  Code   1  [place] MFM OB FOLLOW UP                         [id number]  ----------------------------------------------------------------------   #  Ordered By               Order #        Accession #    Episode #   1  RACHELLE Margurite Auerbach          [id number]      [id number]     [id number]  ---------------------------------------------------------------------- Indications   [redacted] weeks gestation of pregnancy                Z3A.44   Advanced maternal age multigravida (37),       O09.522   second trimester-low risk NIPS   Bipolar disease during pregnancy               O99.340 F31.9   Pseudoseizures    Obesity complicating pregnancy, third          O99.213   trimester   Hypertension - Gestational (labetalol)         O13.9  ---------------------------------------------------------------------- OB History  Blood Type:            Height:  5'8"   Weight (lb):  270       BMI:  41.05  Gravidity:    2  TOP:          1 ---------------------------------------------------------------------- Fetal Evaluation  Num Of Fetuses:     1  Fetal Heart         129  Rate(bpm):  Cardiac Activity:   Observed  Presentation:       Breech  Placenta:           Posterior, above cervical os  P. Cord Insertion:  Previously Visualized  Amniotic Fluid  AFI FV:      Subjectively within normal limits  AFI Sum(cm)     %Tile       Largest Pocket(cm)  18.83           71          7.15  RUQ(cm)       RLQ(cm)       LUQ(cm)        LLQ(cm)  7.15          5.41          2.99           3.28 ---------------------------------------------------------------------- Biometry  BPD:      81.8  mm     G. Age:  32w 6d         91  %    CI:        75.16   %    70 - 86                                                          FL/HC:      19.4   %    19.3 - 21.3  HC:      299.3  mm     G. Age:  33w 1d         77  %    HC/AC:  1.01        0.96 - 1.17  AC:      297.5  mm     G. Age:  33w 5d       > 97  %    FL/BPD:     71.0   %    71 - 87  FL:       58.1  mm     G. Age:  30w 2d         23  %    FL/AC:      19.5   %    20 - 24  HUM:      53.2  mm     G. Age:  31w 0d         55  %  Est. FW:    2017  gm      4 lb 7 oz     81  % ---------------------------------------------------------------------- Gestational Age  LMP:           30w 6d        Date:  11/03/15                 EDD:   08/09/16  U/S Today:     32w 4d                                        EDD:   07/28/16  Best:          30w 6d     Det. By:  LMP  (11/03/15)          EDD:   08/09/16 ---------------------------------------------------------------------- Anatomy  Cranium:               Appears normal         Aortic  Arch:            Previously seen  Cavum:                 Appears normal         Ductal Arch:            Previously seen  Ventricles:            Appears normal         Diaphragm:              Previously seen  Choroid Plexus:        Previously seen        Stomach:                Appears normal, left                                                                        sided  Cerebellum:            Previously seen        Abdomen:                Appears normal  Posterior Fossa:       Appears normal         Abdominal Wall:  Not well visualized  Nuchal Fold:           Not applicable (>06    Cord Vessels:           Appears normal ([redacted]                         wks GA)                                        vessel cord)  Face:                  Appears normal         Kidneys:                Appear normal                         (orbits and profile)  Lips:                  Appears normal         Bladder:                Appears normal  Thoracic:              Appears normal         Spine:                  Previously seen  Heart:                 Appears normal         Upper Extremities:      Previously seen                         (4CH, axis, and situs  RVOT:                  Previously seen        Lower Extremities:      Previously seen  LVOT:                  Previously seen  Other:  Female gender. NB visualized. Technically difficult due to fetal position. ---------------------------------------------------------------------- Cervix Uterus Adnexa  Cervix  Not visualized (advanced GA >29wks)  Uterus  No abnormality visualized.  Left Ovary  Not visualized.  Right Ovary  Not visualized. ---------------------------------------------------------------------- Impression  Single living intrauterine pregnancy at [redacted]w[redacted]d.  Breech presentation.  Posterior placenta without evidence of previa.  Appropriate interval fetal growth (81%).  Normal amniotic fluid volume.  Cord insertion not well visualized.  Normal interval fetal anatomy.  ---------------------------------------------------------------------- Recommendations  Given hypertension, recommend serial ultrasounds for growth  and antenatal testing to begin at 32 weeks.  Abdominal wall to be reassessed at the time of follow-up. ----------------------------------------------------------------------                   Abram Sander, MD Electronically Signed Final Report   06/06/2016 12:47 pm ----------------------------------------------------------------------   MAU Course/MDM: NST reviewed and found to be reactive. Consult Dr Harolyn Rutherford, (who reviewed entire FHR tracing) with presentation, exam findings and test results.  Treatments in MAU included NST  FHR was reactive throughout monitoring.    Assessment: Single IUP at [redacted]w[redacted]d Nonreactive NST and BPP 6/8 Now with reactive nonstress test, Category I  Plan:  Discharge home Preterm Labor precautions and fetal kick counts Follow up in Office for prenatal visits and recheck of BPP tomorrow  Encouraged to return here or to other Urgent Care/ED if she develops worsening of symptoms, increase in pain, fever, or other concerning symptoms.    Pt stable at time of discharge.  Hansel Feinstein CNM, MSN Certified Nurse-Midwife 06/12/2016 1:40 PM

## 2016-06-12 NOTE — MAU Note (Signed)
Patient sent up from clinic for further evaluation. Has non-reactive nst; BPP 6/8.  Denies LOF or VB at this time. Denies pain. +FM  FHR 130's.  Casey Lang, CNM bedside.

## 2016-06-12 NOTE — Progress Notes (Signed)
Patient is concerned about her BP- she got a reading of 151/101 this morning and she took 400 mg of her Labetalol. Her main concerns are centered around her BP.

## 2016-06-12 NOTE — Discharge Instructions (Signed)
Nonstress Test The nonstress test is a procedure that monitors the fetus's heartbeat. The test will monitor the heartbeat when the fetus is at rest and while the fetus is moving. In a healthy fetus, there will be an increase in fetal heart rate when the fetus moves or kicks. The heart rate will decrease at rest. This test helps determine if the fetus is healthy. Your health care provider will look at a number of patterns in the heart rate tracing to make sure your baby is thriving. If there is concern, your health care provider may order additional tests or may suggest another course of action. This test is often done in the third trimester and can help determine if an early delivery is needed and safe. Common reasons to have this test are:  You are past your due date.  You have a high-risk pregnancy.  You are feeling less movement than normal.  You have lost a pregnancy in the past.  Your health care provider suspects fetal growth problems.  You have too much or too little amniotic fluid. What happens before the procedure?  Eat a meal right before the test or as directed by your health care provider. Food may help stimulate fetal movements.  Use the restroom right before the test. What happens during the procedure?  Two belts will be placed around your abdomen. These belts have monitors attached to them. One records the fetal heart rate and the other records uterine contractions.  You may be asked to lie down on your side or to stay sitting upright.  You may be given a button to press when you feel movement.  The fetal heartbeat is listened to and watched on a screen. The heartbeat is recorded on a sheet of paper.  If the fetus seems to be sleeping, you may be asked to drink some juice or soda, gently press your abdomen, or make some noise to wake the fetus. What happens after the procedure? Your health care provider will discuss the test results with you and make recommendations for  the near future.  This information is not intended to replace advice given to you by your health care provider. Make sure you discuss any questions you have with your health care provider. This information is not intended to replace advice given to you by your health care provider. Make sure you discuss any questions you have with your health care provider. Document Released: 01/18/2002 Document Revised: 12/29/2015 Document Reviewed: 03/03/2012 Elsevier Interactive Patient Education  2017 [employer].

## 2016-06-12 NOTE — Progress Notes (Signed)
Subjective:  Casey Lang is a 36 y.o. G2P0010 at [redacted]w[redacted]d being seen today for ongoing prenatal care.  She is currently monitored for the following issues for this high-risk pregnancy and has Bipolar disorder, curr episode mixed, severe, with psychotic features ([place]); Hyperprolactinemia ([place]); PTSD (post-traumatic stress disorder); GAD (generalized anxiety disorder); Bipolar I disorder, most recent episode (or current) manic ([place]); Supervision of high risk pregnancy, antepartum; Pseudoseizures; AMA (advanced maternal age) multigravida 25+; Conversion disorder with attacks or seizures; GBS (group B Streptococcus carrier), +RV culture, currently pregnant; and Gestational hypertension, third trimester on her problem list.  Patient reports BP this morning at home was 151/101. She denies any HA, visual changes or RUQ pain. She took 400 mg of Labetalol this morning..  Contractions: Not present. Vag. Bleeding: None.  Movement: Present. Denies leaking of fluid.   The following portions of the patient's history were reviewed and updated as appropriate: allergies, current medications, past family history, past medical history, past social history, past surgical history and problem list. Problem list updated.  Objective:   Vitals:   06/12/16 1034  BP: (!) 138/94  Pulse: 90  Weight: 280 lb 9.6 oz (127.3 kg)    Fetal Status:     Movement: Present     General:  Alert, oriented and cooperative. Patient is in no acute distress.  Skin: Skin is warm and dry. No rash noted.   Cardiovascular: Normal heart rate noted  Respiratory: Normal respiratory effort, no problems with respiration noted  Abdomen: Soft, gravid, appropriate for gestational age. Pain/Pressure: Absent     Pelvic:  Cervical exam deferred        Extremities: Normal range of motion.  Edema: None  Mental Status: Normal mood and affect. Normal behavior. Normal judgment and thought content.   Urinalysis: Urine Protein: 1+ Urine Glucose:  Negative  Assessment and Plan:  Pregnancy: G2P0010 at [redacted]w[redacted]d  1. Gestational hypertension, third trimester NST non reactive . Will send to MFM for BPP Continue with BASA and Labetalol 200 mg tid Pt instructed to bring BP cuff to next OV to verify readings - Fetal nonstress test; Future - [place] [facility] FETAL BPP WO NON STRESS; Future Will continue with twice weekly testing 2. GBS (group B Streptococcus carrier), +RV culture, currently pregnant Tx while in labor  3. Elderly multigravida in third trimester   4. Conversion disorder with attacks or seizures Has seen Neurology Continue with meds 5. Pseudoseizures Has seen Neurology  6. Supervision of high risk pregnancy, antepartum   7. Hyperprolactinemia ([place]) Has seen Endocrine. Will follow up after pregnancy Suspects related to medication  Preterm labor symptoms and general obstetric precautions including but not limited to vaginal bleeding, contractions, leaking of fluid and fetal movement were reviewed in detail with the patient. Please refer to After Visit Summary for other counseling recommendations.  Return in about 1 week (around 06/19/2016).   Chancy Milroy, MD

## 2016-06-13 ENCOUNTER — Ambulatory Visit (HOSPITAL_COMMUNITY)
Admission: RE | Admit: 2016-06-13 | Discharge: 2016-06-13 | Disposition: A | Discharge status: Home / Self Care | Payer: Medicaid Other | Source: Ambulatory Visit | Attending: Certified Nurse Midwife | Admitting: Certified Nurse Midwife

## 2016-06-13 ENCOUNTER — Encounter (HOSPITAL_COMMUNITY): Payer: Self-pay

## 2016-06-13 ENCOUNTER — Other Ambulatory Visit (HOSPITAL_COMMUNITY): Payer: Self-pay | Admitting: *Deleted

## 2016-06-13 ENCOUNTER — Ambulatory Visit (HOSPITAL_COMMUNITY): Payer: Medicaid Other

## 2016-06-13 DIAGNOSIS — O09893 Supervision of other high risk pregnancies, third trimester: Secondary | ICD-10-CM | POA: Diagnosis not present

## 2016-06-13 DIAGNOSIS — O139 Gestational [pregnancy-induced] hypertension without significant proteinuria, unspecified trimester: Secondary | ICD-10-CM

## 2016-06-13 DIAGNOSIS — O133 Gestational [pregnancy-induced] hypertension without significant proteinuria, third trimester: Secondary | ICD-10-CM

## 2016-06-13 DIAGNOSIS — O0990 Supervision of high risk pregnancy, unspecified, unspecified trimester: Secondary | ICD-10-CM

## 2016-06-13 NOTE — Procedures (Signed)
Casey Lang July 27, 1980 [redacted]w[redacted]d  Fetus A Non-Stress Test Interpretation for 06/13/16  Indication: Unsatisfactory BPP  Fetal Heart Rate A Mode: External Baseline Rate (A): 125 bpm Variability: Moderate Accelerations: 15 x 15 Decelerations: None Multiple birth?: No  Uterine Activity Mode: Palpation, Toco Contraction Frequency (min): U/I Contraction Duration (sec): 20  Interpretation (Fetal Testing) Nonstress Test Interpretation: Reactive Overall Impression: Reassuring for gestational age Comments: Reviewed strip with Dr. Lisbeth Renshaw

## 2016-06-18 ENCOUNTER — Ambulatory Visit (INDEPENDENT_AMBULATORY_CARE_PROVIDER_SITE_OTHER): Payer: Medicaid Other | Admitting: Obstetrics and Gynecology

## 2016-06-18 VITALS — BP 146/96 | HR 77 | Wt 287.0 lb

## 2016-06-18 DIAGNOSIS — O09523 Supervision of elderly multigravida, third trimester: Secondary | ICD-10-CM

## 2016-06-18 DIAGNOSIS — O99820 Streptococcus B carrier state complicating pregnancy: Secondary | ICD-10-CM | POA: Diagnosis not present

## 2016-06-18 DIAGNOSIS — O133 Gestational [pregnancy-induced] hypertension without significant proteinuria, third trimester: Secondary | ICD-10-CM | POA: Diagnosis not present

## 2016-06-18 DIAGNOSIS — O0993 Supervision of high risk pregnancy, unspecified, third trimester: Secondary | ICD-10-CM

## 2016-06-18 DIAGNOSIS — O0990 Supervision of high risk pregnancy, unspecified, unspecified trimester: Secondary | ICD-10-CM

## 2016-06-18 NOTE — Progress Notes (Signed)
NST

## 2016-06-18 NOTE — Progress Notes (Signed)
   PRENATAL VISIT NOTE  Subjective:  Casey Lang is a 36 y.o. G2P0010 at [redacted]w[redacted]d being seen today for ongoing prenatal care.  She is currently monitored for the following issues for this high-risk pregnancy and has Bipolar disorder, curr episode mixed, severe, with psychotic features ([place]); Hyperprolactinemia ([place]); PTSD (post-traumatic stress disorder); GAD (generalized anxiety disorder); Bipolar I disorder, most recent episode (or current) manic ([place]); Supervision of high risk pregnancy, antepartum; Pseudoseizures; AMA (advanced maternal age) multigravida 59+; Conversion disorder with attacks or seizures; GBS (group B Streptococcus carrier), +RV culture, currently pregnant; and Gestational hypertension, third trimester on her problem list.  Patient reports no complaints.  Contractions: Not present. Vag. Bleeding: None.  Movement: Present. Denies leaking of fluid.   The following portions of the patient's history were reviewed and updated as appropriate: allergies, current medications, past family history, past medical history, past social history, past surgical history and problem list. Problem list updated.  Objective:   Vitals:   06/18/16 1014  BP: (!) 146/96  Pulse: 77  Weight: 287 lb (130.2 kg)    Fetal Status: Fetal Heart Rate (bpm): NST   Movement: Present     General:  Alert, oriented and cooperative. Patient is in no acute distress.  Skin: Skin is warm and dry. No rash noted.   Cardiovascular: Normal heart rate noted  Respiratory: Normal respiratory effort, no problems with respiration noted  Abdomen: Soft, gravid, appropriate for gestational age. Pain/Pressure: Present     Pelvic:  Cervical exam deferred        Extremities: Normal range of motion.  Edema: Trace  Mental Status: Normal mood and affect. Normal behavior. Normal judgment and thought content.   Assessment and Plan:  Pregnancy: G2P0010 at [redacted]w[redacted]d  1. Supervision of high risk pregnancy, antepartum Patient is  doing well without complaints Patient desires BTL- consent form signed 06/18/2016 - Fetal nonstress test; Future- NST reviewed and reactive with baseline 130, mod variability, + accels, no decels  2. Gestational hypertension, third trimester Continue labetalol and baby ASA Patient is not taking labetalol as instructed but rather would take it only when BP is high.  BPP later this week Growth ultrasound already scheduled Plan for IOL at 37 weeks  3. GBS (group B Streptococcus carrier), +RV culture, currently pregnant Will provide prophylaxis in labor  4. Elderly multigravida in third trimester   Preterm labor symptoms and general obstetric precautions including but not limited to vaginal bleeding, contractions, leaking of fluid and fetal movement were reviewed in detail with the patient. Please refer to After Visit Summary for other counseling recommendations.  No Follow-up on file.   Duchess Armendarez, Vickii Chafe, MD

## 2016-06-20 ENCOUNTER — Ambulatory Visit (HOSPITAL_COMMUNITY)
Admission: RE | Admit: 2016-06-20 | Discharge: 2016-06-20 | Disposition: A | Discharge status: Home / Self Care | Payer: Medicaid Other | Source: Ambulatory Visit | Attending: Certified Nurse Midwife | Admitting: Certified Nurse Midwife

## 2016-06-20 ENCOUNTER — Other Ambulatory Visit (HOSPITAL_COMMUNITY): Payer: Self-pay | Admitting: Maternal and Fetal Medicine

## 2016-06-20 ENCOUNTER — Encounter (HOSPITAL_COMMUNITY): Payer: Self-pay

## 2016-06-20 DIAGNOSIS — O139 Gestational [pregnancy-induced] hypertension without significant proteinuria, unspecified trimester: Secondary | ICD-10-CM | POA: Insufficient documentation

## 2016-06-20 DIAGNOSIS — O99340 Other mental disorders complicating pregnancy, unspecified trimester: Secondary | ICD-10-CM | POA: Diagnosis not present

## 2016-06-20 DIAGNOSIS — O403XX0 Polyhydramnios, third trimester, not applicable or unspecified: Secondary | ICD-10-CM | POA: Diagnosis not present

## 2016-06-20 DIAGNOSIS — F319 Bipolar disorder, unspecified: Secondary | ICD-10-CM | POA: Insufficient documentation

## 2016-06-20 DIAGNOSIS — O99213 Obesity complicating pregnancy, third trimester: Secondary | ICD-10-CM

## 2016-06-20 DIAGNOSIS — Z3A33 33 weeks gestation of pregnancy: Secondary | ICD-10-CM | POA: Diagnosis not present

## 2016-06-20 DIAGNOSIS — O09523 Supervision of elderly multigravida, third trimester: Secondary | ICD-10-CM

## 2016-06-20 DIAGNOSIS — O133 Gestational [pregnancy-induced] hypertension without significant proteinuria, third trimester: Secondary | ICD-10-CM

## 2016-06-20 DIAGNOSIS — O99343 Other mental disorders complicating pregnancy, third trimester: Secondary | ICD-10-CM

## 2016-06-25 ENCOUNTER — Ambulatory Visit (INDEPENDENT_AMBULATORY_CARE_PROVIDER_SITE_OTHER): Payer: Medicaid Other | Admitting: Obstetrics & Gynecology

## 2016-06-25 VITALS — BP 122/84 | HR 90 | Wt 287.0 lb

## 2016-06-25 DIAGNOSIS — O0990 Supervision of high risk pregnancy, unspecified, unspecified trimester: Secondary | ICD-10-CM

## 2016-06-25 DIAGNOSIS — O133 Gestational [pregnancy-induced] hypertension without significant proteinuria, third trimester: Secondary | ICD-10-CM | POA: Diagnosis not present

## 2016-06-25 DIAGNOSIS — O0993 Supervision of high risk pregnancy, unspecified, third trimester: Secondary | ICD-10-CM

## 2016-06-25 MED ORDER — LABETALOL HCL 200 MG PO TABS: 400.0000 mg | ORAL_TABLET | Freq: Two times a day (BID) | ORAL | 2 refills | Status: DC

## 2016-06-25 NOTE — Patient Instructions (Signed)

## 2016-06-25 NOTE — Progress Notes (Signed)
BPP 8/8 5 days ago and scheduled for 5/17   PRENATAL VISIT NOTE  Subjective:  Casey Lang is a 36 y.o. G2P0010 at [redacted]w[redacted]d being seen today for ongoing prenatal care.  She is currently monitored for the following issues for this high-risk pregnancy and has Bipolar disorder, curr episode mixed, severe, with psychotic features ([place]); Hyperprolactinemia ([place]); PTSD (post-traumatic stress disorder); GAD (generalized anxiety disorder); Bipolar I disorder, most recent episode (or current) manic ([place]); Supervision of high risk pregnancy, antepartum; Pseudoseizures; AMA (advanced maternal age) multigravida 45+; Conversion disorder with attacks or seizures; GBS (group B Streptococcus carrier), +RV culture, currently pregnant; and Gestational hypertension, third trimester on her problem list.  Patient reports notes BP at home up to 170/100.  Contractions: Irritability. Vag. Bleeding: None.  Movement: Present. Denies leaking of fluid.   The following portions of the patient's history were reviewed and updated as appropriate: allergies, current medications, past family history, past medical history, past social history, past surgical history and problem list. Problem list updated.  Objective:   Vitals:   06/25/16 1506  BP: 122/84  Pulse: 90  Weight: 130.2 kg (287 lb)    Fetal Status:     Movement: Present     General:  Alert, oriented and cooperative. Patient is in no acute distress.  Skin: Skin is warm and dry. No rash noted.   Cardiovascular: Normal heart rate noted  Respiratory: Normal respiratory effort, no problems with respiration noted  Abdomen: Soft, gravid, appropriate for gestational age. Pain/Pressure: Present     Pelvic:  Cervical exam deferred        Extremities: Normal range of motion.  Edema: Trace  Mental Status: Normal mood and affect. Normal behavior. Normal judgment and thought content.   Assessment and Plan:  Pregnancy: G2P0010 at [redacted]w[redacted]d  1. Supervision of high risk  pregnancy, antepartum   2. Gestational hypertension, third trimester BPP 8/8, repeat in 2 days. Increase Labetalol to 400 mg BID  Preterm labor symptoms and general obstetric precautions including but not limited to vaginal bleeding, contractions, leaking of fluid and fetal movement were reviewed in detail with the patient. Please refer to After Visit Summary for other counseling recommendations.  Return in about 1 week (around 07/02/2016), or if symptoms worsen or fail to improve.   Woodroe Mode, MD

## 2016-06-27 ENCOUNTER — Encounter (HOSPITAL_COMMUNITY): Payer: Self-pay

## 2016-06-27 ENCOUNTER — Other Ambulatory Visit (HOSPITAL_COMMUNITY): Payer: Self-pay | Admitting: Maternal and Fetal Medicine

## 2016-06-27 ENCOUNTER — Ambulatory Visit (HOSPITAL_COMMUNITY)
Admission: RE | Admit: 2016-06-27 | Discharge: 2016-06-27 | Disposition: A | Discharge status: Home / Self Care | Payer: Medicaid Other | Source: Ambulatory Visit | Attending: Certified Nurse Midwife | Admitting: Certified Nurse Midwife

## 2016-06-27 ENCOUNTER — Inpatient Hospital Stay (HOSPITAL_COMMUNITY): Admission: RE | Admit: 2016-06-27 | Payer: Self-pay | Source: Ambulatory Visit

## 2016-06-27 DIAGNOSIS — O133 Gestational [pregnancy-induced] hypertension without significant proteinuria, third trimester: Secondary | ICD-10-CM | POA: Diagnosis not present

## 2016-06-27 DIAGNOSIS — O99213 Obesity complicating pregnancy, third trimester: Secondary | ICD-10-CM | POA: Insufficient documentation

## 2016-06-27 DIAGNOSIS — F319 Bipolar disorder, unspecified: Secondary | ICD-10-CM | POA: Insufficient documentation

## 2016-06-27 DIAGNOSIS — Z3A34 34 weeks gestation of pregnancy: Secondary | ICD-10-CM | POA: Insufficient documentation

## 2016-06-27 DIAGNOSIS — O403XX0 Polyhydramnios, third trimester, not applicable or unspecified: Secondary | ICD-10-CM | POA: Diagnosis not present

## 2016-06-27 DIAGNOSIS — O139 Gestational [pregnancy-induced] hypertension without significant proteinuria, unspecified trimester: Secondary | ICD-10-CM

## 2016-06-27 DIAGNOSIS — O99340 Other mental disorders complicating pregnancy, unspecified trimester: Secondary | ICD-10-CM | POA: Insufficient documentation

## 2016-06-27 DIAGNOSIS — O09523 Supervision of elderly multigravida, third trimester: Secondary | ICD-10-CM

## 2016-06-27 NOTE — Addendum Note (Signed)
Encounter addended by: Elisabeth Cara, RT on: 06/27/2016 10:04 AM<BR>    Actions taken: Imaging Exam ended

## 2016-06-30 ENCOUNTER — Inpatient Hospital Stay (HOSPITAL_COMMUNITY)
Admission: AD | Admit: 2016-06-30 | Discharge: 2016-06-30 | Disposition: A | Discharge status: Home / Self Care | Payer: Medicaid Other | Source: Ambulatory Visit | Attending: Obstetrics & Gynecology | Admitting: Obstetrics & Gynecology

## 2016-06-30 ENCOUNTER — Encounter (HOSPITAL_COMMUNITY): Payer: Self-pay

## 2016-06-30 DIAGNOSIS — Z9884 Bariatric surgery status: Secondary | ICD-10-CM | POA: Diagnosis not present

## 2016-06-30 DIAGNOSIS — Z79899 Other long term (current) drug therapy: Secondary | ICD-10-CM | POA: Insufficient documentation

## 2016-06-30 DIAGNOSIS — Z9889 Other specified postprocedural states: Secondary | ICD-10-CM | POA: Insufficient documentation

## 2016-06-30 DIAGNOSIS — F319 Bipolar disorder, unspecified: Secondary | ICD-10-CM | POA: Insufficient documentation

## 2016-06-30 DIAGNOSIS — Z888 Allergy status to other drugs, medicaments and biological substances status: Secondary | ICD-10-CM | POA: Diagnosis not present

## 2016-06-30 DIAGNOSIS — Z3A34 34 weeks gestation of pregnancy: Secondary | ICD-10-CM | POA: Insufficient documentation

## 2016-06-30 DIAGNOSIS — Z7982 Long term (current) use of aspirin: Secondary | ICD-10-CM | POA: Diagnosis not present

## 2016-06-30 DIAGNOSIS — O163 Unspecified maternal hypertension, third trimester: Secondary | ICD-10-CM | POA: Diagnosis not present

## 2016-06-30 DIAGNOSIS — O133 Gestational [pregnancy-induced] hypertension without significant proteinuria, third trimester: Secondary | ICD-10-CM | POA: Diagnosis present

## 2016-06-30 DIAGNOSIS — O99343 Other mental disorders complicating pregnancy, third trimester: Secondary | ICD-10-CM | POA: Insufficient documentation

## 2016-06-30 HISTORY — DX: Essential (primary) hypertension: I10

## 2016-06-30 LAB — COMPREHENSIVE METABOLIC PANEL
ALBUMIN: 2.5 g/dL — AB (ref 3.5–5.0)
ALT: 11 U/L — ABNORMAL LOW (ref 14–54)
ANION GAP: 6 (ref 5–15)
AST: 13 U/L — AB (ref 15–41)
Alkaline Phosphatase: 62 U/L (ref 38–126)
BILIRUBIN TOTAL: 0.3 mg/dL (ref 0.3–1.2)
BUN: 15 mg/dL (ref 6–20)
CO2: 23 mmol/L (ref 22–32)
Calcium: 8.8 mg/dL — ABNORMAL LOW (ref 8.9–10.3)
Chloride: 107 mmol/L (ref 101–111)
Creatinine, Ser: 0.78 mg/dL (ref 0.44–1.00)
GFR calc Af Amer: >60 mL/min (ref >60)
GLUCOSE: 91 mg/dL (ref 65–99)
Potassium: 4.4 mmol/L (ref 3.5–5.1)
Sodium: 136 mmol/L (ref 135–145)
TOTAL PROTEIN: 6.3 g/dL — AB (ref 6.5–8.1)

## 2016-06-30 LAB — URINALYSIS, ROUTINE W REFLEX MICROSCOPIC
BILIRUBIN URINE: NEGATIVE
Glucose, UA: NEGATIVE mg/dL
KETONES UR: NEGATIVE mg/dL
Nitrite: NEGATIVE
PH: 5 (ref 5.0–8.0)
Protein, ur: 100 mg/dL — AB
Specific Gravity, Urine: 1.03 (ref 1.005–1.030)

## 2016-06-30 LAB — CBC
HEMATOCRIT: 28.4 % — AB (ref 36.0–46.0)
HEMOGLOBIN: 9.3 g/dL — AB (ref 12.0–15.0)
MCH: 28.3 pg (ref 26.0–34.0)
MCHC: 32.7 g/dL (ref 30.0–36.0)
MCV: 86.3 fL (ref 78.0–100.0)
Platelets: 253 10*3/uL (ref 150–400)
RBC: 3.29 MIL/uL — ABNORMAL LOW (ref 3.87–5.11)
RDW: 14.6 % (ref 11.5–15.5)
WBC: 5.3 10*3/uL (ref 4.0–10.5)

## 2016-06-30 LAB — PROTEIN / CREATININE RATIO, URINE
Creatinine, Urine: 305 mg/dL
Protein Creatinine Ratio: 0.26 mg/mg{Cre} — ABNORMAL HIGH (ref 0.00–0.15)
Total Protein, Urine: 79 mg/dL

## 2016-06-30 NOTE — Discharge Instructions (Signed)
Dehydration, Adult Dehydration is a condition in which there is not enough fluid or water in the body. This happens when you lose more fluids than you take in. Important organs, such as the kidneys, brain, and heart, cannot function without a proper amount of fluids. Any loss of fluids from the body can lead to dehydration. Dehydration can range from mild to severe. This condition should be treated right away to prevent it from becoming severe. What are the causes? This condition may be caused by:  Vomiting.  Diarrhea.  Excessive sweating, such as from heat exposure or exercise.  Not drinking enough fluid, especially:  When ill.  While doing activity that requires a lot of energy.  Excessive urination.  Fever.  Infection.  Certain medicines, such as medicines that cause the body to lose excess fluid (diuretics).  Inability to access safe drinking water.  Reduced physical ability to get adequate water and food. What increases the risk? This condition is more likely to develop in people:  Who have a poorly controlled long-term (chronic) illness, such as diabetes, heart disease, or kidney disease.  Who are age 65 or older.  Who are disabled.  Who live in a place with high altitude.  Who play endurance sports. What are the signs or symptoms? Symptoms of mild dehydration may include:   Thirst.  Dry lips.  Slightly dry mouth.  Dry, warm skin.  Dizziness. Symptoms of moderate dehydration may include:   Very dry mouth.  Muscle cramps.  Dark urine. Urine may be the color of tea.  Decreased urine production.  Decreased tear production.  Heartbeat that is irregular or faster than normal (palpitations).  Headache.  Light-headedness, especially when you stand up from a sitting position.  Fainting (syncope). Symptoms of severe dehydration may include:   Changes in skin, such as:  Cold and clammy skin.  Blotchy (mottled) or pale skin.  Skin that does  not quickly return to normal after being lightly pinched and released (poor skin turgor).  Changes in body fluids, such as:  Extreme thirst.  No tear production.  Inability to sweat when body temperature is high, such as in hot weather.  Very little urine production.  Changes in vital signs, such as:  Weak pulse.  Pulse that is more than 100 beats a minute when sitting still.  Rapid breathing.  Low blood pressure.  Other changes, such as:  Sunken eyes.  Cold hands and feet.  Confusion.  Lack of energy (lethargy).  Difficulty waking up from sleep.  Short-term weight loss.  Unconsciousness. How is this diagnosed? This condition is diagnosed based on your symptoms and a physical exam. Blood and urine tests may be done to help confirm the diagnosis. How is this treated? Treatment for this condition depends on the severity. Mild or moderate dehydration can often be treated at home. Treatment should be started right away. Do not wait until dehydration becomes severe. Severe dehydration is an emergency and it needs to be treated in a hospital. Treatment for mild dehydration may include:   Drinking more fluids.  Replacing salts and minerals in your blood (electrolytes) that you may have lost. Treatment for moderate dehydration may include:   Drinking an oral rehydration solution (ORS). This is a drink that helps you replace fluids and electrolytes (rehydrate). It can be found at pharmacies and retail stores. Treatment for severe dehydration may include:   Receiving fluids through an IV tube.  Receiving an electrolyte solution through a feeding tube that is   passed through your nose and into your stomach (nasogastric tube, or NG tube).  Correcting any abnormalities in electrolytes.  Treating the underlying cause of dehydration. Follow these instructions at home:  If directed by your health care provider, drink an ORS:  Make an ORS by following instructions on the  package.  Start by drinking small amounts, about  cup (120 mL) every 5-10 minutes.  Slowly increase how much you drink until you have taken the amount recommended by your health care provider.  Drink enough clear fluid to keep your urine clear or pale yellow. If you were told to drink an ORS, finish the ORS first, then start slowly drinking other clear fluids. Drink fluids such as:  Water. Do not drink only water. Doing that can lead to having too little salt (sodium) in the body (hyponatremia).  Ice chips.  Fruit juice that you have added water to (diluted fruit juice).  Low-calorie sports drinks.  Avoid:  Alcohol.  Drinks that contain a lot of sugar. These include high-calorie sports drinks, fruit juice that is not diluted, and soda.  Caffeine.  Foods that are greasy or contain a lot of fat or sugar.  Take over-the-counter and prescription medicines only as told by your health care provider.  Do not take sodium tablets. This can lead to having too much sodium in the body (hypernatremia).  Eat foods that contain a healthy balance of electrolytes, such as bananas, oranges, potatoes, tomatoes, and spinach.  Keep all follow-up visits as told by your health care provider. This is important. Contact a health care provider if:  You have abdominal pain that:  Gets worse.  Stays in one area (localizes).  You have a rash.  You have a stiff neck.  You are more irritable than usual.  You are sleepier or more difficult to wake up than usual.  You feel weak or dizzy.  You feel very thirsty.  You have urinated only a small amount of very dark urine over 6-8 hours. Get help right away if:  You have symptoms of severe dehydration.  You cannot drink fluids without vomiting.  Your symptoms get worse with treatment.  You have a fever.  You have a severe headache.  You have vomiting or diarrhea that:  Gets worse.  Does not go away.  You have blood or green matter  (bile) in your vomit.  You have blood in your stool. This may cause stool to look black and tarry.  You have not urinated in 6-8 hours.  You faint.  Your heart rate while sitting still is over 100 beats a minute.  You have trouble breathing. This information is not intended to replace advice given to you by your health care provider. Make sure you discuss any questions you have with your health care provider. Document Released: 01/28/2005 Document Revised: 08/25/2015 Document Reviewed: 03/24/2015 Elsevier Interactive Patient Education  2017 [employer]  

## 2016-06-30 NOTE — MAU Note (Signed)
Pt reports she has been being treated for gestational hypertension and today her B/P at home has been elevated with her bottom number over 100

## 2016-06-30 NOTE — MAU Note (Signed)
efm discontinued per Eusebio Me CNM

## 2016-06-30 NOTE — MAU Provider Note (Signed)
History   Patient Casey Lang is a 36 y.o. G2P0010 At [redacted]w[redacted]d Here with complaints of elevated blood pressure  At home. Patient has been taking her blood pressure at home as she is being monitoring for pre-eclampsia. CSN: [id number]  Arrival date and time: 06/30/16 1407   First Provider Initiated Contact with Patient 06/30/16 1444      No chief complaint on file.  HPI Patient says taht she and her husband starting taking her blood pressure yesterday. They are not sure if the cuff fits right (they have been taking her BP on her forearm). Readings at home have been 130-140/90s. She denies headache, epigastric pain, decreased fetal movements, bleeding, leaking of fluid.  Patient states that her prenatal course has been complicated by [facility] and that her medication keeps changing as her bp is very labile.  OB History    Gravida Para Term Preterm AB Living   2       1 0   SAB TAB Ectopic Multiple Live Births                  Past Medical History:  Diagnosis Date  . Anxiety   . Bipolar 1 disorder ([place])   . Depression   . Hypertension    pregnancy induced hypertension  . Mental disorder   . Right foot injury 10/09/2012   Resolved without complication    Past Surgical History:  Procedure Laterality Date  . FOOT SURGERY    . LAPAROSCOPIC GASTRIC SLEEVE RESECTION  10/09/2012   Surgery was on 09/30/2011    Family History  Problem Relation Age of Onset  . Hypertension Mother   . Diabetes Mother   . Diabetes Father   . Hypertension Father   . Cancer Other   . Alzheimer's disease Other   . Aneurysm Other   . Anxiety disorder Other   . Depression Other   . Seizures Neg Hx     Social History  Substance Use Topics  . Smoking status: Never Smoker  . Smokeless tobacco: Never Used  . Alcohol use No    Allergies:  Allergies  Allergen Reactions  . Morphine And Related Other (See Comments)    Reaction:  Psychosis     Prescriptions Prior to Admission  Medication Sig  Dispense Refill Last Dose  . aspirin 81 MG chewable tablet Chew 1 tablet (81 mg total) by mouth daily. 60 tablet 1 06/30/2016 at 1000  . folic acid (FOLVITE) 1 MG tablet Take 4 tablets (4 mg total) by mouth daily. 120 tablet 5 06/30/2016 at Unknown time  . gabapentin (NEURONTIN) 300 MG capsule Take 300 mg by mouth at bedtime.   06/29/2016 at Unknown time  . labetalol (NORMODYNE) 200 MG tablet Take 2 tablets (400 mg total) by mouth 2 (two) times daily. 90 tablet 2 06/30/2016 at 0500  . lamoTRIgine (LAMICTAL) 150 MG tablet Take 2 tablets (300 mg total) by mouth every evening. 60 tablet 0 06/29/2016 at Unknown time  . omega-3 acid ethyl esters (LOVAZA) 1 g capsule Take 1 g by mouth daily.    06/30/2016 at Unknown time  . Prenat-FeAsp-Meth-FA-DHA w/o A (PRENATE PIXIE) 10-0.6-0.4-200 MG CAPS Take 1 capsule by mouth daily.   06/30/2016 at Unknown time  . QUEtiapine (SEROQUEL) 300 MG tablet Take 600 mg by mouth at bedtime.   06/29/2016 at Unknown time  . traZODone (DESYREL) 100 MG tablet Take 100 mg by mouth at bedtime.   06/29/2016 at Unknown time    Review  of Systems  Respiratory: Negative.   Cardiovascular: Negative.   Gastrointestinal: Negative.   Genitourinary: Negative.   Neurological: Negative.    Physical Exam   Blood pressure 124/81, pulse 72, temperature 98 F (36.7 C), temperature source Oral, resp. rate 18, height 5\' 8"  (1.727 m), weight 288 lb (130.6 kg), last menstrual period 11/03/2015, SpO2 100 %.  Physical Exam  Constitutional: She is oriented to person, place, and time. She appears well-developed and well-nourished.  HENT:  Head: Normocephalic.  Respiratory: Effort normal.  GI: Soft.  Musculoskeletal: Normal range of motion.  Neurological: She is alert and oriented to person, place, and time.  Skin: Skin is warm and dry.    MAU Course  Procedures  MDM -CBC, CMP, UPC all normal  -NST 120 with acels, moderate variability, occasaional variable with loss of contact -Patient has  many questions about taking her bipolar medication in labor Given patient's normotensive readings in MAU and the fact that patient has labile bps, patient safe for discharge with follow up appointment scheduled with Dr. Roselie Awkward on Wednesday.   Assessment and Plan   1. Transient hypertension of pregnancy in third trimester    2. Patient stable for discharge with recommendation to purchase larger size cuff and take blood pressure at rest, with feet flat, without talking.  3. Reviewed when to return to the MAU (bleeding, leaking of fluid, decreased fetal movements, other signs of pre-e) 4. Patient to keep ob appt on Wednesday, May 22.  5. Advised patient to drink plenty of fluids 6. Patient to taking her Bipolar medication and discuss her concerns about her Bipolar treatment in labor at her appointment on Wednesday.   Mervyn Skeeters K[place]ra 06/30/2016, 3:59 PM

## 2016-07-01 ENCOUNTER — Telehealth: Payer: Self-pay | Admitting: *Deleted

## 2016-07-01 NOTE — Telephone Encounter (Signed)
Patient is 34 weeks and states when she wiped she saw mucus with blood. She describes it as mucus, greenish with red to pinkish blood. She is not having cramping or any other discharge. Discussed mucus plug and what it appears like. She has an appointment in the office tomorrow. We will check this for her at that appointment- she is reassured and will call back with any changes.

## 2016-07-02 ENCOUNTER — Ambulatory Visit (INDEPENDENT_AMBULATORY_CARE_PROVIDER_SITE_OTHER): Payer: Medicaid Other | Admitting: Obstetrics & Gynecology

## 2016-07-02 VITALS — BP 139/91 | HR 83 | Wt 286.0 lb

## 2016-07-02 DIAGNOSIS — O133 Gestational [pregnancy-induced] hypertension without significant proteinuria, third trimester: Secondary | ICD-10-CM

## 2016-07-02 DIAGNOSIS — O0993 Supervision of high risk pregnancy, unspecified, third trimester: Secondary | ICD-10-CM

## 2016-07-02 DIAGNOSIS — O0990 Supervision of high risk pregnancy, unspecified, unspecified trimester: Secondary | ICD-10-CM

## 2016-07-02 NOTE — Progress Notes (Signed)
Patient reports good fetal movement and pressure. Pt states that she had bright red bleeding from vagina yesterday after bowel movement, it then turned pink tinged and pt reports no bleeding today.

## 2016-07-02 NOTE — Progress Notes (Signed)
   PRENATAL VISIT NOTE  Subjective:  Casey Lang is a 36 y.o. G2P0010 at [redacted]w[redacted]d being seen today for ongoing prenatal care.  She is currently monitored for the following issues for this high-risk pregnancy and has Bipolar disorder, curr episode mixed, severe, with psychotic features ([place]); Hyperprolactinemia ([place]); PTSD (post-traumatic stress disorder); GAD (generalized anxiety disorder); Bipolar I disorder, most recent episode (or current) manic ([place]); Supervision of high risk pregnancy, antepartum; Pseudoseizures; AMA (advanced maternal age) multigravida 76+; Conversion disorder with attacks or seizures; GBS (group B Streptococcus carrier), +RV culture, currently pregnant; and Gestational hypertension, third trimester on her problem list.  Patient reports MAU visit 5/20 for HTN.  Contractions: Irritability. Vag. Bleeding: None.  Movement: Present. Denies leaking of fluid.   The following portions of the patient's history were reviewed and updated as appropriate: allergies, current medications, past family history, past medical history, past social history, past surgical history and problem list. Problem list updated.  Objective:   Vitals:   07/02/16 1317 07/02/16 1344 07/02/16 1345  BP: (!) 153/105 (!) 160/119 (!) 139/91  Pulse: 88 90 83  Weight: 286 lb (129.7 kg)      Fetal Status: Fetal Heart Rate (bpm): NST   Movement: Present     General:  Alert, oriented and cooperative. Patient is in no acute distress.  Skin: Skin is warm and dry. No rash noted.   Cardiovascular: Normal heart rate noted  Respiratory: Normal respiratory effort, no problems with respiration noted  Abdomen: Soft, gravid, appropriate for gestational age. Pain/Pressure: Present     Pelvic:  Cervical exam deferred        Extremities: Normal range of motion.  Edema: Trace  Mental Status: Normal mood and affect. Normal behavior. Normal judgment and thought content.   Assessment and Plan:  Pregnancy: G2P0010 at  [redacted]w[redacted]d  1. Supervision of high risk pregnancy, antepartum Normal labs and BP 2 days ago, transient elevation today  2. Gestational hypertension, third trimester Increase Labetalol to 400 mg TID  Preterm labor symptoms and general obstetric precautions including but not limited to vaginal bleeding, contractions, leaking of fluid and fetal movement were reviewed in detail with the patient. Please refer to After Visit Summary for other counseling recommendations.  Return in about 1 week (around 07/09/2016).   Emeterio Reeve, MD

## 2016-07-02 NOTE — Patient Instructions (Signed)
Hypertension During Pregnancy Hypertension is also called high blood pressure. High blood pressure means that the force of your blood moving in your body is too strong. When you are pregnant, this condition should be watched carefully. It can cause problems for you and your baby. Follow these instructions at home: Eating and drinking  Drink enough fluid to keep your pee (urine) clear or pale yellow.  Eat healthy foods that are low in salt (sodium). ? Do not add salt to your food. ? Check labels on foods and drinks to see much salt is in them. Look on the label where you see "Sodium." Lifestyle  Do not use any products that contain nicotine or tobacco, such as cigarettes and e-cigarettes. If you need help quitting, ask your doctor.  Do not use alcohol.  Avoid caffeine.  Avoid stress. Rest and get plenty of sleep. General instructions  Take over-the-counter and prescription medicines only as told by your doctor.  While lying down, lie on your left side. This keeps pressure off your baby.  While sitting or lying down, raise (elevate) your feet. Try putting some pillows under your lower legs.  Exercise regularly. Ask your doctor what kinds of exercise are best for you.  Keep all prenatal and follow-up visits as told by your doctor. This is important. Contact a doctor if:  You have symptoms that your doctor told you to watch for, such as: ? Fever. ? Throwing up (vomiting). ? Headache. Get help right away if:  You have very bad pain in your belly (abdomen).  You are throwing up, and this does not get better with treatment.  You suddenly get swelling in your hands, ankles, or face.  You gain 4 lb (1.8 kg) or more in 1 week.  You get bleeding from your vagina.  You have blood in your pee.  You do not feel your baby moving as much as normal.  You have a change in vision.  You have muscle twitching or sudden tightening (spasms).  You have trouble breathing.  Your lips  or fingernails turn blue. This information is not intended to replace advice given to you by your health care provider. Make sure you discuss any questions you have with your health care provider. Document Released: 03/02/2010 Document Revised: 10/10/2015 Document Reviewed: 10/10/2015 Elsevier Interactive Patient Education  2017 [employer]  

## 2016-07-04 ENCOUNTER — Other Ambulatory Visit (HOSPITAL_COMMUNITY): Payer: Self-pay | Admitting: Maternal and Fetal Medicine

## 2016-07-04 ENCOUNTER — Encounter (HOSPITAL_COMMUNITY): Payer: Self-pay

## 2016-07-04 ENCOUNTER — Ambulatory Visit (HOSPITAL_COMMUNITY): Payer: Self-pay

## 2016-07-04 ENCOUNTER — Ambulatory Visit (HOSPITAL_COMMUNITY)
Admission: RE | Admit: 2016-07-04 | Discharge: 2016-07-04 | Disposition: A | Discharge status: Home / Self Care | Payer: Medicaid Other | Source: Ambulatory Visit | Attending: Certified Nurse Midwife | Admitting: Certified Nurse Midwife

## 2016-07-04 DIAGNOSIS — O09523 Supervision of elderly multigravida, third trimester: Secondary | ICD-10-CM | POA: Insufficient documentation

## 2016-07-04 DIAGNOSIS — O99213 Obesity complicating pregnancy, third trimester: Secondary | ICD-10-CM | POA: Insufficient documentation

## 2016-07-04 DIAGNOSIS — O3413 Maternal care for benign tumor of corpus uteri, third trimester: Secondary | ICD-10-CM | POA: Diagnosis not present

## 2016-07-04 DIAGNOSIS — Z3A35 35 weeks gestation of pregnancy: Secondary | ICD-10-CM

## 2016-07-04 DIAGNOSIS — O139 Gestational [pregnancy-induced] hypertension without significant proteinuria, unspecified trimester: Secondary | ICD-10-CM | POA: Diagnosis not present

## 2016-07-04 DIAGNOSIS — O99343 Other mental disorders complicating pregnancy, third trimester: Secondary | ICD-10-CM | POA: Insufficient documentation

## 2016-07-04 DIAGNOSIS — F319 Bipolar disorder, unspecified: Secondary | ICD-10-CM | POA: Diagnosis not present

## 2016-07-04 DIAGNOSIS — O403XX0 Polyhydramnios, third trimester, not applicable or unspecified: Secondary | ICD-10-CM | POA: Insufficient documentation

## 2016-07-09 ENCOUNTER — Ambulatory Visit (INDEPENDENT_AMBULATORY_CARE_PROVIDER_SITE_OTHER): Payer: Medicaid Other | Admitting: Obstetrics and Gynecology

## 2016-07-09 ENCOUNTER — Ambulatory Visit (HOSPITAL_COMMUNITY)
Admission: RE | Admit: 2016-07-09 | Discharge: 2016-07-09 | Disposition: A | Discharge status: Home / Self Care | Payer: Medicaid Other | Source: Ambulatory Visit | Attending: Obstetrics and Gynecology | Admitting: Obstetrics and Gynecology

## 2016-07-09 ENCOUNTER — Encounter (HOSPITAL_COMMUNITY): Payer: Self-pay

## 2016-07-09 ENCOUNTER — Inpatient Hospital Stay (HOSPITAL_COMMUNITY)
Admission: AD | Admit: 2016-07-09 | Discharge: 2016-07-09 | Disposition: A | Discharge status: Home / Self Care | Payer: Medicaid Other | Source: Ambulatory Visit | Attending: Family Medicine | Admitting: Family Medicine

## 2016-07-09 ENCOUNTER — Other Ambulatory Visit (HOSPITAL_COMMUNITY)
Admission: RE | Admit: 2016-07-09 | Discharge: 2016-07-09 | Disposition: A | Discharge status: Home / Self Care | Payer: Medicaid Other | Source: Ambulatory Visit | Attending: Obstetrics and Gynecology | Admitting: Obstetrics and Gynecology

## 2016-07-09 ENCOUNTER — Encounter (HOSPITAL_COMMUNITY): Payer: Self-pay | Admitting: *Deleted

## 2016-07-09 VITALS — BP 130/87 | HR 86 | Wt 290.0 lb

## 2016-07-09 DIAGNOSIS — O99343 Other mental disorders complicating pregnancy, third trimester: Secondary | ICD-10-CM | POA: Diagnosis not present

## 2016-07-09 DIAGNOSIS — O403XX0 Polyhydramnios, third trimester, not applicable or unspecified: Secondary | ICD-10-CM | POA: Diagnosis not present

## 2016-07-09 DIAGNOSIS — O0993 Supervision of high risk pregnancy, unspecified, third trimester: Secondary | ICD-10-CM

## 2016-07-09 DIAGNOSIS — O09523 Supervision of elderly multigravida, third trimester: Secondary | ICD-10-CM | POA: Insufficient documentation

## 2016-07-09 DIAGNOSIS — O99340 Other mental disorders complicating pregnancy, unspecified trimester: Secondary | ICD-10-CM | POA: Insufficient documentation

## 2016-07-09 DIAGNOSIS — O99213 Obesity complicating pregnancy, third trimester: Secondary | ICD-10-CM | POA: Insufficient documentation

## 2016-07-09 DIAGNOSIS — Z6841 Body Mass Index (BMI) 40.0 and over, adult: Secondary | ICD-10-CM | POA: Insufficient documentation

## 2016-07-09 DIAGNOSIS — O133 Gestational [pregnancy-induced] hypertension without significant proteinuria, third trimester: Secondary | ICD-10-CM | POA: Diagnosis not present

## 2016-07-09 DIAGNOSIS — Z3A35 35 weeks gestation of pregnancy: Secondary | ICD-10-CM | POA: Diagnosis not present

## 2016-07-09 DIAGNOSIS — Z7982 Long term (current) use of aspirin: Secondary | ICD-10-CM | POA: Insufficient documentation

## 2016-07-09 DIAGNOSIS — O289 Unspecified abnormal findings on antenatal screening of mother: Secondary | ICD-10-CM | POA: Insufficient documentation

## 2016-07-09 DIAGNOSIS — O0990 Supervision of high risk pregnancy, unspecified, unspecified trimester: Secondary | ICD-10-CM

## 2016-07-09 DIAGNOSIS — O163 Unspecified maternal hypertension, third trimester: Secondary | ICD-10-CM | POA: Insufficient documentation

## 2016-07-09 DIAGNOSIS — O3410 Maternal care for benign tumor of corpus uteri, unspecified trimester: Secondary | ICD-10-CM | POA: Diagnosis not present

## 2016-07-09 DIAGNOSIS — Z3689 Encounter for other specified antenatal screening: Secondary | ICD-10-CM

## 2016-07-09 DIAGNOSIS — O99820 Streptococcus B carrier state complicating pregnancy: Secondary | ICD-10-CM

## 2016-07-09 DIAGNOSIS — Z79899 Other long term (current) drug therapy: Secondary | ICD-10-CM | POA: Diagnosis not present

## 2016-07-09 DIAGNOSIS — F319 Bipolar disorder, unspecified: Secondary | ICD-10-CM | POA: Insufficient documentation

## 2016-07-09 NOTE — Discharge Instructions (Signed)
Hypertension During Pregnancy °Hypertension, commonly called high blood pressure, is when the force of blood pumping through your arteries is too strong. Arteries are blood vessels that carry blood from the heart throughout the body. Hypertension during pregnancy can cause problems for you and your baby. Your baby may be born early (prematurely) or may not weigh as much as he or she should at birth. Very bad cases of hypertension during pregnancy can be life-threatening. °Different types of hypertension can occur during pregnancy. These include: °· Chronic hypertension. This happens when: °¨ You have hypertension before pregnancy and it continues during pregnancy. °¨ You develop hypertension before you are [redacted] weeks pregnant, and it continues during pregnancy. °· Gestational hypertension. This is hypertension that develops after the 20th week of pregnancy. °· Preeclampsia, also called toxemia of pregnancy. This is a very serious type of hypertension that develops only during pregnancy. It affects the whole body, and it can be very dangerous for you and your baby. °Gestational hypertension and preeclampsia usually go away within 6 weeks after your baby is born. Women who have hypertension during pregnancy have a greater chance of developing hypertension later in life or during future pregnancies. °What are the causes? °The exact cause of hypertension is not known. °What increases the risk? °There are certain factors that make it more likely for you to develop hypertension during pregnancy. These include: °· Having hypertension during a previous pregnancy or prior to pregnancy. °· Being overweight. °· Being older than age 40. °· Being pregnant for the first time or being pregnant with more than one baby. °· Becoming pregnant using fertilization methods such as IVF (in vitro fertilization). °· Having diabetes, kidney problems, or systemic lupus erythematosus. °· Having a family history of hypertension. °What are the  signs or symptoms? °Chronic hypertension and gestational hypertension rarely cause symptoms. Preeclampsia causes symptoms, which may include: °· Increased protein in your urine. Your health care provider will check for this at every visit before you give birth (prenatal visit). °· Severe headaches. °· Sudden weight gain. °· Swelling of the hands, face, legs, and feet. °· Nausea and vomiting. °· Vision problems, such as blurred or double vision. °· Numbness in the face, arms, legs, and feet. °· Dizziness. °· Slurred speech. °· Sensitivity to bright lights. °· Abdominal pain. °· Convulsions. °How is this diagnosed? °You may be diagnosed with hypertension during a routine prenatal exam. At each prenatal visit, you may: °· Have a urine test to check for high amounts of protein in your urine. °· Have your blood pressure checked. A blood pressure reading is recorded as two numbers, such as "120 over 80" (or 120/80). The first ("top") number is called the systolic pressure. It is a measure of the pressure in your arteries when your heart beats. The second ("bottom") number is called the diastolic pressure. It is a measure of the pressure in your arteries as your heart relaxes between beats. Blood pressure is measured in a unit called mm Hg. A normal blood pressure reading is: °¨ Systolic: below 120. °¨ Diastolic: below 80. °The type of hypertension that you are diagnosed with depends on your test results and when your symptoms developed. °· Chronic hypertension is usually diagnosed before 20 weeks of pregnancy. °· Gestational hypertension is usually diagnosed after 20 weeks of pregnancy. °· Hypertension with high amounts of protein in the urine is diagnosed as preeclampsia. °· Blood pressure measurements that stay above 160 systolic, or above 110 diastolic, are signs of severe preeclampsia. °  How is this treated? Treatment for hypertension during pregnancy varies depending on the type of hypertension you have and how  serious it is.  If you take medicines called ACE inhibitors to treat chronic hypertension, you may need to switch medicines. ACE inhibitors should not be taken during pregnancy.  If you have gestational hypertension, you may need to take blood pressure medicine.  If you are at risk for preeclampsia, your health care provider may recommend that you take a low-dose aspirin every day to prevent high blood pressure during your pregnancy.  If you have severe preeclampsia, you may need to be hospitalized so you and your baby can be monitored closely. You may also need to take medicine (magnesium sulfate) to prevent seizures and to lower blood pressure. This medicine may be given as an injection or through an IV tube.  In some cases, if your condition gets worse, you may need to deliver your baby early. Follow these instructions at home: Eating and drinking   Drink enough fluid to keep your urine clear or pale yellow.  Eat a healthy diet that is low in salt (sodium). Do not add salt to your food. Check food labels to see how much sodium a food or beverage contains. Lifestyle   Do not use any products that contain nicotine or tobacco, such as cigarettes and e-cigarettes. If you need help quitting, ask your health care provider.  Do not use alcohol.  Avoid caffeine.  Avoid stress as much as possible. Rest and get plenty of sleep. General instructions   Take over-the-counter and prescription medicines only as told by your health care provider.  While lying down, lie on your left side. This keeps pressure off your baby.  While sitting or lying down, raise (elevate) your feet. Try putting some pillows under your lower legs.  Exercise regularly. Ask your health care provider what kinds of exercise are best for you.  Keep all prenatal and follow-up visits as told by your health care provider. This is important. Contact a health care provider if:  You have symptoms that your health care  provider told you may require more treatment or monitoring, such as:  Fever.  Vomiting.  Headache. Get help right away if:  You have severe abdominal pain or vomiting that does not get better with treatment.  You suddenly develop swelling in your hands, ankles, or face.  You gain 4 lbs (1.8 kg) or more in 1 week.  You develop vaginal bleeding, or you have blood in your urine.  You do not feel your baby moving as much as usual.  You have blurred or double vision.  You have muscle twitching or sudden tightening (spasms).  You have shortness of breath.  Your lips or fingernails turn blue. This information is not intended to replace advice given to you by your health care provider. Make sure you discuss any questions you have with your health care provider. Document Released: 10/16/2010 Document Revised: 08/18/2015 Document Reviewed: 07/14/2015 Elsevier Interactive Patient Education  2017 [employer]. Fetal Movement Counts Patient Name: ________________________________________________ Patient Due Date: ____________________ What is a fetal movement count? A fetal movement count is the number of times that you feel your baby move during a certain amount of time. This may also be called a fetal kick count. A fetal movement count is recommended for every pregnant woman. You may be asked to start counting fetal movements as early as week 28 of your pregnancy. Pay attention to when your baby is  most active. You may notice your baby's sleep and wake cycles. You may also notice things that make your baby move more. You should do a fetal movement count:  When your baby is normally most active.  At the same time each day. A good time to count movements is while you are resting, after having something to eat and drink. How do I count fetal movements? 1. Find a quiet, comfortable area. Sit, or lie down on your side. 2. Write down the date, the start time and stop time, and the number of  movements that you felt between those two times. Take this information with you to your health care visits. 3. For 2 hours, count kicks, flutters, swishes, rolls, and jabs. You should feel at least 10 movements during 2 hours. 4. You may stop counting after you have felt 10 movements. 5. If you do not feel 10 movements in 2 hours, have something to eat and drink. Then, keep resting and counting for 1 hour. If you feel at least 4 movements during that hour, you may stop counting. Contact a health care provider if:  You feel fewer than 4 movements in 2 hours.  Your baby is not moving like he or she usually does. Date: ____________ Start time: ____________ Stop time: ____________ Movements: ____________ Date: ____________ Start time: ____________ Stop time: ____________ Movements: ____________ Date: ____________ Start time: ____________ Stop time: ____________ Movements: ____________ Date: ____________ Start time: ____________ Stop time: ____________ Movements: ____________ Date: ____________ Start time: ____________ Stop time: ____________ Movements: ____________ Date: ____________ Start time: ____________ Stop time: ____________ Movements: ____________ Date: ____________ Start time: ____________ Stop time: ____________ Movements: ____________ Date: ____________ Start time: ____________ Stop time: ____________ Movements: ____________ Date: ____________ Start time: ____________ Stop time: ____________ Movements: ____________ This information is not intended to replace advice given to you by your health care provider. Make sure you discuss any questions you have with your health care provider. Document Released: 02/27/2006 Document Revised: 09/27/2015 Document Reviewed: 03/09/2015 Elsevier Interactive Patient Education  2017 [employer].

## 2016-07-09 NOTE — Progress Notes (Signed)
Written and verbal d/c instructions given and understanding vocied 

## 2016-07-09 NOTE — MAU Provider Note (Signed)
History     CSN: [id number]  Arrival date and time: 07/09/16 1041   First Provider Initiated Contact with Patient 07/09/16 1124      Chief Complaint  Patient presents with  . non reactive nst   G2P0010 @35 .6 wks sent from MFM for BPP 6/8 (off for breathing). She was seen in the office and had non-reactive NST today then sent to MFM. Pregnancy has been complicated by gestational HTN, AMA, and conversion disorder (on Lamictal). She reports good FM. No VB, LOF, or ctx. No HA, visual disturbances, or epigastric pain.   OB History    Gravida Para Term Preterm AB Living   2       1 0   SAB TAB Ectopic Multiple Live Births                  Past Medical History:  Diagnosis Date  . Anxiety   . Bipolar 1 disorder ([place])   . Depression   . Hypertension    pregnancy induced hypertension  . Mental disorder   . Right foot injury 10/09/2012   Resolved without complication    Past Surgical History:  Procedure Laterality Date  . FOOT SURGERY    . LAPAROSCOPIC GASTRIC SLEEVE RESECTION  10/09/2012   Surgery was on 09/30/2011    Family History  Problem Relation Age of Onset  . Hypertension Mother   . Diabetes Mother   . Diabetes Father   . Hypertension Father   . Cancer Other   . Alzheimer's disease Other   . Aneurysm Other   . Anxiety disorder Other   . Depression Other   . Seizures Neg Hx     Social History  Substance Use Topics  . Smoking status: Never Smoker  . Smokeless tobacco: Never Used  . Alcohol use No    Allergies:  Allergies  Allergen Reactions  . Morphine And Related Other (See Comments)    Reaction:  Psychosis     Prescriptions Prior to Admission  Medication Sig Dispense Refill Last Dose  . aspirin 81 MG chewable tablet Chew 1 tablet (81 mg total) by mouth daily. 60 tablet 1 07/09/2016 at Unknown time  . calcium carbonate (TUMS - DOSED IN MG ELEMENTAL CALCIUM) 500 MG chewable tablet Chew 2 tablets by mouth daily.   Past Week at Unknown time  . folic  acid (FOLVITE) 1 MG tablet Take 4 tablets (4 mg total) by mouth daily. 120 tablet 5 07/09/2016 at Unknown time  . gabapentin (NEURONTIN) 300 MG capsule Take 300 mg by mouth at bedtime.   07/08/2016 at Unknown time  . labetalol (NORMODYNE) 200 MG tablet Take 2 tablets (400 mg total) by mouth 2 (two) times daily. (Patient taking differently: Take 400 mg by mouth 3 (three) times daily. ) 90 tablet 2 07/09/2016 at Unknown time  . lamoTRIgine (LAMICTAL) 150 MG tablet Take 2 tablets (300 mg total) by mouth every evening. 60 tablet 0 07/08/2016 at Unknown time  . omega-3 acid ethyl esters (LOVAZA) 1 g capsule Take 1 g by mouth daily.    07/09/2016 at Unknown time  . Prenat-FeAsp-Meth-FA-DHA w/o A (PRENATE PIXIE) 10-0.6-0.4-200 MG CAPS Take 1 capsule by mouth daily.   07/09/2016 at Unknown time  . QUEtiapine (SEROQUEL) 300 MG tablet Take 600 mg by mouth at bedtime.   07/08/2016 at Unknown time  . traZODone (DESYREL) 100 MG tablet Take 100 mg by mouth at bedtime.   07/08/2016 at Unknown time    Review of  Systems  Eyes: Negative for visual disturbance.  Gastrointestinal: Negative for abdominal pain.  Genitourinary: Negative for vaginal bleeding.  Neurological: Negative for headaches.   Physical Exam   Blood pressure (!) 137/93, pulse 82, temperature 98.1 F (36.7 C), temperature source Oral, resp. rate 18, last menstrual period 11/03/2015, SpO2 100 %.  Physical Exam  Constitutional: She is oriented to person, place, and time. She appears well-developed and well-nourished. No distress.  HENT:  Head: Normocephalic and atraumatic.  Neck: Normal range of motion.  Cardiovascular: Normal rate.   Respiratory: Effort normal.  GI: Soft. There is no tenderness.  gravid  Musculoskeletal: Normal range of motion.  Neurological: She is alert and oriented to person, place, and time.  Skin: Skin is warm and dry.  Psychiatric: She has a normal mood and affect.   EFM: 135 bpm, mod variability, + accels, no  decels Toco: irritability  MAU Course  Procedures  MDM NST reactive now. Presentation, clinical findings, and plan discussed with Dr. Nehemiah Settle. Stable for discharge home.  Assessment and Plan   1. [redacted] weeks gestation of pregnancy   2. Pregnancy-induced hypertension in third trimester   3. NST (non-stress test) reactive    Discharge home Follow up in office as scheduled in 2 days [facility] Return precautions  Allergies as of 07/09/2016      Reactions   Morphine And Related Other (See Comments)   Reaction:  Psychosis       Medication List    TAKE these medications   aspirin 81 MG chewable tablet Chew 1 tablet (81 mg total) by mouth daily.   calcium carbonate 500 MG chewable tablet Commonly known as:  TUMS - dosed in mg elemental calcium Chew 2 tablets by mouth daily.   folic acid 1 MG tablet Commonly known as:  FOLVITE Take 4 tablets (4 mg total) by mouth daily.   gabapentin 300 MG capsule Commonly known as:  NEURONTIN Take 300 mg by mouth at bedtime.   labetalol 200 MG tablet Commonly known as:  NORMODYNE Take 2 tablets (400 mg total) by mouth 2 (two) times daily. What changed:  when to take this   lamoTRIgine 150 MG tablet Commonly known as:  LAMICTAL Take 2 tablets (300 mg total) by mouth every evening.   omega-3 acid ethyl esters 1 g capsule Commonly known as:  LOVAZA Take 1 g by mouth daily.   PRENATE PIXIE 10-0.6-0.4-200 MG Caps Take 1 capsule by mouth daily.   QUEtiapine 300 MG tablet Commonly known as:  SEROQUEL Take 600 mg by mouth at bedtime.   traZODone 100 MG tablet Commonly known as:  DESYREL Take 100 mg by mouth at bedtime.      Julianne Handler, CNM 07/09/2016, 11:37 AM

## 2016-07-09 NOTE — Patient Instructions (Signed)
Third Trimester of Pregnancy The third trimester is from week 28 through week 40 (months 7 through 9). The third trimester is a time when the unborn baby (fetus) is growing rapidly. At the end of the ninth month, the fetus is about 20 inches in length and weighs 6-10 pounds. Body changes during your third trimester Your body will continue to go through many changes during pregnancy. The changes vary from woman to woman. During the third trimester:  Your weight will continue to increase. You can expect to gain 25-35 pounds (11-16 kg) by the end of the pregnancy.  You may begin to get stretch marks on your hips, abdomen, and breasts.  You may urinate more often because the fetus is moving lower into your pelvis and pressing on your bladder.  You may develop or continue to have heartburn. This is caused by increased hormones that slow down muscles in the digestive tract.  You may develop or continue to have constipation because increased hormones slow digestion and cause the muscles that push waste through your intestines to relax.  You may develop hemorrhoids. These are swollen veins (varicose veins) in the rectum that can itch or be painful.  You may develop swollen, bulging veins (varicose veins) in your legs.  You may have increased body aches in the pelvis, back, or thighs. This is due to weight gain and increased hormones that are relaxing your joints.  You may have changes in your hair. These can include thickening of your hair, rapid growth, and changes in texture. Some women also have hair loss during or after pregnancy, or hair that feels dry or thin. Your hair will most likely return to normal after your baby is born.  Your breasts will continue to grow and they will continue to become tender. A yellow fluid (colostrum) may leak from your breasts. This is the first milk you are producing for your baby.  Your belly button may stick out.  You may notice more swelling in your hands,  face, or ankles.  You may have increased tingling or numbness in your hands, arms, and legs. The skin on your belly may also feel numb.  You may feel short of breath because of your expanding uterus.  You may have more problems sleeping. This can be caused by the size of your belly, increased need to urinate, and an increase in your body's metabolism.  You may notice the fetus "dropping," or moving lower in your abdomen (lightening).  You may have increased vaginal discharge.  You may notice your joints feel loose and you may have pain around your pelvic bone.  What to expect at prenatal visits You will have prenatal exams every 2 weeks until week 36. Then you will have weekly prenatal exams. During a routine prenatal visit:  You will be weighed to make sure you and the baby are growing normally.  Your blood pressure will be taken.  Your abdomen will be measured to track your baby's growth.  The fetal heartbeat will be listened to.  Any test results from the previous visit will be discussed.  You may have a cervical check near your due date to see if your cervix has softened or thinned (effaced).  You will be tested for Group B streptococcus. This happens between 35 and 37 weeks.  Your health care provider may ask you:  What your birth plan is.  How you are feeling.  If you are feeling the baby move.  If you have had   any abnormal symptoms, such as leaking fluid, bleeding, severe headaches, or abdominal cramping.  If you are using any tobacco products, including cigarettes, chewing tobacco, and electronic cigarettes.  If you have any questions.  Other tests or screenings that may be performed during your third trimester include:  Blood tests that check for low iron levels (anemia).  Fetal testing to check the health, activity level, and growth of the fetus. Testing is done if you have certain medical conditions or if there are problems during the  pregnancy.  Nonstress test (NST). This test checks the health of your baby to make sure there are no signs of problems, such as the baby not getting enough oxygen. During this test, a belt is placed around your belly. The baby is made to move, and its heart rate is monitored during movement.  What is false labor? False labor is a condition in which you feel small, irregular tightenings of the muscles in the womb (contractions) that usually go away with rest, changing position, or drinking water. These are called Braxton Hicks contractions. Contractions may last for hours, days, or even weeks before true labor sets in. If contractions come at regular intervals, become more frequent, increase in intensity, or become painful, you should see your health care provider. What are the signs of labor?  Abdominal cramps.  Regular contractions that start at 10 minutes apart and become stronger and more frequent with time.  Contractions that start on the top of the uterus and spread down to the lower abdomen and back.  Increased pelvic pressure and dull back pain.  A watery or bloody mucus discharge that comes from the vagina.  Leaking of amniotic fluid. This is also known as your "water breaking." It could be a slow trickle or a gush. Let your health care provider know if it has a color or strange odor. If you have any of these signs, call your health care provider right away, even if it is before your due date. Follow these instructions at home: Medicines  Follow your health care provider's instructions regarding medicine use. Specific medicines may be either safe or unsafe to take during pregnancy.  Take a prenatal vitamin that contains at least 600 micrograms (mcg) of folic acid.  If you develop constipation, try taking a stool softener if your health care provider approves. Eating and drinking  Eat a balanced diet that includes fresh fruits and vegetables, whole grains, good sources of protein  such as meat, eggs, or tofu, and low-fat dairy. Your health care provider will help you determine the amount of weight gain that is right for you.  Avoid raw meat and uncooked cheese. These carry germs that can cause birth defects in the baby.  If you have low calcium intake from food, talk to your health care provider about whether you should take a daily calcium supplement.  Eat four or five small meals rather than three large meals a day.  Limit foods that are high in fat and processed sugars, such as fried and sweet foods.  To prevent constipation: ? Drink enough fluid to keep your urine clear or pale yellow. ? Eat foods that are high in fiber, such as fresh fruits and vegetables, whole grains, and beans. Activity  Exercise only as directed by your health care provider. Most women can continue their usual exercise routine during pregnancy. Try to exercise for 30 minutes at least 5 days a week. Stop exercising if you experience uterine contractions.  Avoid heavy   lifting.  Do not exercise in extreme heat or humidity, or at high altitudes.  Wear low-heel, comfortable shoes.  Practice good posture.  You may continue to have sex unless your health care provider tells you otherwise. Relieving pain and discomfort  Take frequent breaks and rest with your legs elevated if you have leg cramps or low back pain.  Take warm sitz baths to soothe any pain or discomfort caused by hemorrhoids. Use hemorrhoid cream if your health care provider approves.  Wear a good support bra to prevent discomfort from breast tenderness.  If you develop varicose veins: ? Wear support pantyhose or compression stockings as told by your healthcare provider. ? Elevate your feet for 15 minutes, 3-4 times a day. Prenatal care  Write down your questions. Take them to your prenatal visits.  Keep all your prenatal visits as told by your health care provider. This is important. Safety  Wear your seat belt at  all times when driving.  Make a list of emergency phone numbers, including numbers for family, friends, the hospital, and police and fire departments. General instructions  Avoid cat litter boxes and soil used by cats. These carry germs that can cause birth defects in the baby. If you have a cat, ask someone to clean the litter box for you.  Do not travel far distances unless it is absolutely necessary and only with the approval of your health care provider.  Do not use hot tubs, steam rooms, or saunas.  Do not drink alcohol.  Do not use any products that contain nicotine or tobacco, such as cigarettes and e-cigarettes. If you need help quitting, ask your health care provider.  Do not use any medicinal herbs or unprescribed drugs. These chemicals affect the formation and growth of the baby.  Do not douche or use tampons or scented sanitary pads.  Do not cross your legs for long periods of time.  To prepare for the arrival of your baby: ? Take prenatal classes to understand, practice, and ask questions about labor and delivery. ? Make a trial run to the hospital. ? Visit the hospital and tour the maternity area. ? Arrange for maternity or paternity leave through employers. ? Arrange for family and friends to take care of pets while you are in the hospital. ? Purchase a rear-facing car seat and make sure you know how to install it in your car. ? Pack your hospital bag. ? Prepare the baby's nursery. Make sure to remove all pillows and stuffed animals from the baby's crib to prevent suffocation.  Visit your dentist if you have not gone during your pregnancy. Use a soft toothbrush to brush your teeth and be gentle when you floss. Contact a health care provider if:  You are unsure if you are in labor or if your water has broken.  You become dizzy.  You have mild pelvic cramps, pelvic pressure, or nagging pain in your abdominal area.  You have lower back pain.  You have persistent  nausea, vomiting, or diarrhea.  You have an unusual or bad smelling vaginal discharge.  You have pain when you urinate. Get help right away if:  Your water breaks before 37 weeks.  You have regular contractions less than 5 minutes apart before 37 weeks.  You have a fever.  You are leaking fluid from your vagina.  You have spotting or bleeding from your vagina.  You have severe abdominal pain or cramping.  You have rapid weight loss or weight gain.    You have shortness of breath with chest pain.  You notice sudden or extreme swelling of your face, hands, ankles, feet, or legs.  Your baby makes fewer than 10 movements in 2 hours.  You have severe headaches that do not go away when you take medicine.  You have vision changes. Summary  The third trimester is from week 28 through week 40, months 7 through 9. The third trimester is a time when the unborn baby (fetus) is growing rapidly.  During the third trimester, your discomfort may increase as you and your baby continue to gain weight. You may have abdominal, leg, and back pain, sleeping problems, and an increased need to urinate.  During the third trimester your breasts will keep growing and they will continue to become tender. A yellow fluid (colostrum) may leak from your breasts. This is the first milk you are producing for your baby.  False labor is a condition in which you feel small, irregular tightenings of the muscles in the womb (contractions) that eventually go away. These are called Braxton Hicks contractions. Contractions may last for hours, days, or even weeks before true labor sets in.  Signs of labor can include: abdominal cramps; regular contractions that start at 10 minutes apart and become stronger and more frequent with time; watery or bloody mucus discharge that comes from the vagina; increased pelvic pressure and dull back pain; and leaking of amniotic fluid. This information is not intended to replace advice  given to you by your health care provider. Make sure you discuss any questions you have with your health care provider. Document Released: 01/22/2001 Document Revised: 07/06/2015 Document Reviewed: 03/31/2012 Elsevier Interactive Patient Education  2017 [employer] Inc.  

## 2016-07-09 NOTE — MAU Note (Signed)
Patient was in MFM and was told she failed her NST and had a BPP 6/8. Was sent for further eval and monitoring.   Denies pain or vaginal bleeding or leak of fluid at this time. +FM

## 2016-07-09 NOTE — ED Notes (Signed)
Report called to MAU.  Pt and family ambulated and checked into MAU for further monitoring.

## 2016-07-09 NOTE — Progress Notes (Signed)
Subjective:  Casey Lang is a 36 y.o. G2P0010 at [redacted]w[redacted]d being seen today for ongoing prenatal care.  She is currently monitored for the following issues for this high-risk pregnancy and has Bipolar disorder, curr episode mixed, severe, with psychotic features ([place]); Hyperprolactinemia ([place]); PTSD (post-traumatic stress disorder); GAD (generalized anxiety disorder); Bipolar I disorder, most recent episode (or current) manic ([place]); Supervision of high risk pregnancy, antepartum; Pseudoseizures; AMA (advanced maternal age) multigravida 16+; Conversion disorder with attacks or seizures; GBS (group B Streptococcus carrier), +RV culture, currently pregnant; and Gestational hypertension, third trimester on her problem list.  Patient reports episode of spotting after wiping on Sunday. No more sice. No recent IC. No ut cxt of LOF.  Contractions: Not present. Vag. Bleeding: Scant.  Movement: Present. Denies leaking of fluid.   The following portions of the patient's history were reviewed and updated as appropriate: allergies, current medications, past family history, past medical history, past social history, past surgical history and problem list. Problem list updated.  Objective:   Vitals:   07/09/16 0833  BP: 130/87  Pulse: 86  Weight: 290 lb (131.5 kg)    Fetal Status: Fetal Heart Rate (bpm): NST    Movement: Present     General:  Alert, oriented and cooperative. Patient is in no acute distress.  Skin: Skin is warm and dry. No rash noted.   Cardiovascular: Normal heart rate noted  Respiratory: Normal respiratory effort, no problems with respiration noted  Abdomen: Soft, gravid, appropriate for gestational age. Pain/Pressure: Present     Pelvic:  Cervical exam performed        Extremities: Normal range of motion.  Edema: None  Mental Status: Normal mood and affect. Normal behavior. Normal judgment and thought content.   Urinalysis:      Assessment and Plan:  Pregnancy: G2P0010 at  [redacted]w[redacted]d  1. Supervision of high risk pregnancy, antepartum NST non reactive today. To MFM for BPP - Cervicovaginal ancillary only - [place] MFM FETAL BPP WO NON STRESS; Future  2. GBS (group B Streptococcus carrier), +RV culture, currently pregnant Tx while in labor  3. Elderly multigravida in third trimester Nl NIPS  4. Gestational hypertension, third trimester BP stable Continue with current management - Fetal nonstress test; Future - [place] MFM FETAL BPP WO NON STRESS; Future  Preterm labor symptoms and general obstetric precautions including but not limited to vaginal bleeding, contractions, leaking of fluid and fetal movement were reviewed in detail with the patient. Please refer to After Visit Summary for other counseling recommendations.  Return in about 1 week (around 07/16/2016).   Chancy Milroy, MD

## 2016-07-10 ENCOUNTER — Telehealth (HOSPITAL_COMMUNITY): Payer: Self-pay | Admitting: *Deleted

## 2016-07-10 LAB — CERVICOVAGINAL ANCILLARY ONLY
Bacterial vaginitis: NEGATIVE
Candida vaginitis: POSITIVE — AB
Chlamydia: NEGATIVE
Neisseria Gonorrhea: NEGATIVE
TRICH (WINDOWPATH): NEGATIVE

## 2016-07-10 NOTE — Telephone Encounter (Signed)
Preadmission screen  

## 2016-07-11 ENCOUNTER — Encounter (HOSPITAL_COMMUNITY): Payer: Self-pay

## 2016-07-11 ENCOUNTER — Other Ambulatory Visit (HOSPITAL_COMMUNITY): Payer: Self-pay

## 2016-07-11 ENCOUNTER — Other Ambulatory Visit: Payer: Self-pay

## 2016-07-11 ENCOUNTER — Ambulatory Visit (HOSPITAL_COMMUNITY)
Admission: RE | Admit: 2016-07-11 | Discharge: 2016-07-11 | Disposition: A | Discharge status: Home / Self Care | Payer: Medicaid Other | Source: Ambulatory Visit | Attending: Certified Nurse Midwife | Admitting: Certified Nurse Midwife

## 2016-07-11 DIAGNOSIS — O133 Gestational [pregnancy-induced] hypertension without significant proteinuria, third trimester: Secondary | ICD-10-CM | POA: Diagnosis not present

## 2016-07-11 DIAGNOSIS — O99213 Obesity complicating pregnancy, third trimester: Secondary | ICD-10-CM | POA: Insufficient documentation

## 2016-07-11 DIAGNOSIS — O09523 Supervision of elderly multigravida, third trimester: Secondary | ICD-10-CM | POA: Diagnosis not present

## 2016-07-11 DIAGNOSIS — O403XX0 Polyhydramnios, third trimester, not applicable or unspecified: Secondary | ICD-10-CM | POA: Insufficient documentation

## 2016-07-11 DIAGNOSIS — Z6841 Body Mass Index (BMI) 40.0 and over, adult: Secondary | ICD-10-CM | POA: Insufficient documentation

## 2016-07-11 DIAGNOSIS — F319 Bipolar disorder, unspecified: Secondary | ICD-10-CM | POA: Insufficient documentation

## 2016-07-11 DIAGNOSIS — Z3A36 36 weeks gestation of pregnancy: Secondary | ICD-10-CM | POA: Diagnosis not present

## 2016-07-11 DIAGNOSIS — O139 Gestational [pregnancy-induced] hypertension without significant proteinuria, unspecified trimester: Secondary | ICD-10-CM | POA: Diagnosis present

## 2016-07-11 DIAGNOSIS — O99340 Other mental disorders complicating pregnancy, unspecified trimester: Secondary | ICD-10-CM | POA: Diagnosis not present

## 2016-07-11 DIAGNOSIS — O3410 Maternal care for benign tumor of corpus uteri, unspecified trimester: Secondary | ICD-10-CM | POA: Diagnosis not present

## 2016-07-11 DIAGNOSIS — B379 Candidiasis, unspecified: Secondary | ICD-10-CM

## 2016-07-11 MED ORDER — TERCONAZOLE 0.8 % VA CREA: 1.0000 | TOPICAL_CREAM | Freq: Every day | VAGINAL | 0 refills | Status: DC

## 2016-07-12 ENCOUNTER — Encounter (HOSPITAL_COMMUNITY): Payer: Self-pay | Admitting: *Deleted

## 2016-07-12 ENCOUNTER — Telehealth (HOSPITAL_COMMUNITY): Payer: Self-pay | Admitting: *Deleted

## 2016-07-12 NOTE — Telephone Encounter (Signed)
Preadmission screen  

## 2016-07-16 ENCOUNTER — Other Ambulatory Visit: Payer: Self-pay | Admitting: Obstetrics and Gynecology

## 2016-07-16 ENCOUNTER — Ambulatory Visit (HOSPITAL_COMMUNITY)
Admission: RE | Admit: 2016-07-16 | Discharge: 2016-07-16 | Disposition: A | Discharge status: Home / Self Care | Payer: Medicaid Other | Source: Ambulatory Visit | Attending: Obstetrics and Gynecology | Admitting: Obstetrics and Gynecology

## 2016-07-16 ENCOUNTER — Ambulatory Visit (INDEPENDENT_AMBULATORY_CARE_PROVIDER_SITE_OTHER): Payer: Medicaid Other | Admitting: Obstetrics and Gynecology

## 2016-07-16 VITALS — BP 148/93 | HR 97 | Wt 294.0 lb

## 2016-07-16 DIAGNOSIS — O3413 Maternal care for benign tumor of corpus uteri, third trimester: Secondary | ICD-10-CM

## 2016-07-16 DIAGNOSIS — O99820 Streptococcus B carrier state complicating pregnancy: Secondary | ICD-10-CM

## 2016-07-16 DIAGNOSIS — O0993 Supervision of high risk pregnancy, unspecified, third trimester: Secondary | ICD-10-CM

## 2016-07-16 DIAGNOSIS — O99343 Other mental disorders complicating pregnancy, third trimester: Secondary | ICD-10-CM | POA: Insufficient documentation

## 2016-07-16 DIAGNOSIS — D259 Leiomyoma of uterus, unspecified: Secondary | ICD-10-CM

## 2016-07-16 DIAGNOSIS — O99213 Obesity complicating pregnancy, third trimester: Secondary | ICD-10-CM | POA: Insufficient documentation

## 2016-07-16 DIAGNOSIS — O403XX0 Polyhydramnios, third trimester, not applicable or unspecified: Secondary | ICD-10-CM | POA: Insufficient documentation

## 2016-07-16 DIAGNOSIS — O0990 Supervision of high risk pregnancy, unspecified, unspecified trimester: Secondary | ICD-10-CM

## 2016-07-16 DIAGNOSIS — Z3A36 36 weeks gestation of pregnancy: Secondary | ICD-10-CM

## 2016-07-16 DIAGNOSIS — O289 Unspecified abnormal findings on antenatal screening of mother: Secondary | ICD-10-CM

## 2016-07-16 DIAGNOSIS — F319 Bipolar disorder, unspecified: Secondary | ICD-10-CM | POA: Insufficient documentation

## 2016-07-16 DIAGNOSIS — O288 Other abnormal findings on antenatal screening of mother: Secondary | ICD-10-CM

## 2016-07-16 DIAGNOSIS — O133 Gestational [pregnancy-induced] hypertension without significant proteinuria, third trimester: Secondary | ICD-10-CM | POA: Insufficient documentation

## 2016-07-16 DIAGNOSIS — O09523 Supervision of elderly multigravida, third trimester: Secondary | ICD-10-CM

## 2016-07-16 NOTE — Progress Notes (Addendum)
   PRENATAL VISIT NOTE  Subjective:  Casey Lang is a 36 y.o. G2P0010 at [redacted]w[redacted]d being seen today for ongoing prenatal care.  She is currently monitored for the following issues for this high-risk pregnancy and has Bipolar disorder, curr episode mixed, severe, with psychotic features ([place]); Hyperprolactinemia ([place]); PTSD (post-traumatic stress disorder); GAD (generalized anxiety disorder); Bipolar I disorder, most recent episode (or current) manic ([place]); Supervision of high risk pregnancy, antepartum; Pseudoseizures; AMA (advanced maternal age) multigravida 72+; Conversion disorder with attacks or seizures; GBS (group B Streptococcus carrier), +RV culture, currently pregnant; and Gestational hypertension, third trimester on her problem list.  Patient reports no complaints.  Contractions: Not present. Vag. Bleeding: Scant.  Movement: Present. Denies leaking of fluid.   The following portions of the patient's history were reviewed and updated as appropriate: allergies, current medications, past family history, past medical history, past social history, past surgical history and problem list. Problem list updated.  Objective:   Vitals:   07/16/16 1401  BP: (!) 148/93  Pulse: 97  Weight: 294 lb (133.4 kg)    Fetal Status: Fetal Heart Rate (bpm): NST   Movement: Present     General:  Alert, oriented and cooperative. Patient is in no acute distress.  Skin: Skin is warm and dry. No rash noted.   Cardiovascular: Normal heart rate noted  Respiratory: Normal respiratory effort, no problems with respiration noted  Abdomen: Soft, gravid, appropriate for gestational age. Pain/Pressure: Absent     Pelvic:  Cervical exam deferred        Extremities: Normal range of motion.  Edema: None  Mental Status: Normal mood and affect. Normal behavior. Normal judgment and thought content.   Assessment and Plan:  Pregnancy: G2P0010 at [redacted]w[redacted]d  1. GBS (group B Streptococcus carrier), +RV culture, currently  pregnant Will provide prophylaxis in labor  2. Supervision of high risk pregnancy, antepartum Patient is doing well IOL scheduled for tomorrow  3. Elderly multigravida in third trimester Low risk NIPS  4. Gestational hypertension, third trimester Continue labetalol NST reviewed and non-reactive with baseline 150, mod variability,  noaccels, no decels. Patient sent for BPP Plan for IOL tomorrow  Preterm labor symptoms and general obstetric precautions including but not limited to vaginal bleeding, contractions, leaking of fluid and fetal movement were reviewed in detail with the patient. Please refer to After Visit Summary for other counseling recommendations.  No Follow-up on file.   Mora Bellman, MD

## 2016-07-17 ENCOUNTER — Inpatient Hospital Stay (HOSPITAL_COMMUNITY): Payer: Medicaid Other | Admitting: Anesthesiology

## 2016-07-17 ENCOUNTER — Inpatient Hospital Stay (HOSPITAL_COMMUNITY)
Admission: RE | Admit: 2016-07-17 | Discharge: 2016-07-21 | DRG: 767 | Disposition: A | Discharge status: Home / Self Care | Payer: Medicaid Other | Source: Ambulatory Visit | Attending: Family Medicine | Admitting: Family Medicine

## 2016-07-17 ENCOUNTER — Encounter (HOSPITAL_COMMUNITY): Payer: Self-pay

## 2016-07-17 VITALS — BP 123/78 | HR 89 | Temp 98.2°F | Resp 18

## 2016-07-17 DIAGNOSIS — O139 Gestational [pregnancy-induced] hypertension without significant proteinuria, unspecified trimester: Secondary | ICD-10-CM | POA: Diagnosis present

## 2016-07-17 DIAGNOSIS — Z7982 Long term (current) use of aspirin: Secondary | ICD-10-CM | POA: Diagnosis not present

## 2016-07-17 DIAGNOSIS — Z885 Allergy status to narcotic agent status: Secondary | ICD-10-CM | POA: Diagnosis not present

## 2016-07-17 DIAGNOSIS — O134 Gestational [pregnancy-induced] hypertension without significant proteinuria, complicating childbirth: Secondary | ICD-10-CM | POA: Diagnosis present

## 2016-07-17 DIAGNOSIS — Z302 Encounter for sterilization: Secondary | ICD-10-CM

## 2016-07-17 DIAGNOSIS — O99214 Obesity complicating childbirth: Secondary | ICD-10-CM | POA: Diagnosis present

## 2016-07-17 DIAGNOSIS — O99344 Other mental disorders complicating childbirth: Secondary | ICD-10-CM | POA: Diagnosis present

## 2016-07-17 DIAGNOSIS — F445 Conversion disorder with seizures or convulsions: Secondary | ICD-10-CM | POA: Diagnosis present

## 2016-07-17 DIAGNOSIS — F319 Bipolar disorder, unspecified: Secondary | ICD-10-CM | POA: Diagnosis present

## 2016-07-17 DIAGNOSIS — Z6841 Body Mass Index (BMI) 40.0 and over, adult: Secondary | ICD-10-CM | POA: Diagnosis not present

## 2016-07-17 DIAGNOSIS — O133 Gestational [pregnancy-induced] hypertension without significant proteinuria, third trimester: Secondary | ICD-10-CM

## 2016-07-17 DIAGNOSIS — O99824 Streptococcus B carrier state complicating childbirth: Secondary | ICD-10-CM | POA: Diagnosis present

## 2016-07-17 DIAGNOSIS — Z3A37 37 weeks gestation of pregnancy: Secondary | ICD-10-CM | POA: Diagnosis not present

## 2016-07-17 DIAGNOSIS — F411 Generalized anxiety disorder: Secondary | ICD-10-CM | POA: Diagnosis present

## 2016-07-17 LAB — CBC
HCT: 30.8 % — ABNORMAL LOW (ref 36.0–46.0)
HCT: 30.1 % — ABNORMAL LOW (ref 36.0–46.0)
Hemoglobin: 10.4 g/dL — ABNORMAL LOW (ref 12.0–15.0)
Hemoglobin: 10 g/dL — ABNORMAL LOW (ref 12.0–15.0)
MCH: 28.6 pg (ref 26.0–34.0)
MCH: 28 pg (ref 26.0–34.0)
MCHC: 33.8 g/dL (ref 30.0–36.0)
MCHC: 33.2 g/dL (ref 30.0–36.0)
MCV: 84.6 fL (ref 78.0–100.0)
MCV: 84.3 fL (ref 78.0–100.0)
PLATELETS: 281 10*3/uL (ref 150–400)
Platelets: 270 10*3/uL (ref 150–400)
RBC: 3.64 MIL/uL — AB (ref 3.87–5.11)
RBC: 3.57 MIL/uL — ABNORMAL LOW (ref 3.87–5.11)
RDW: 15.3 % (ref 11.5–15.5)
RDW: 15.3 % (ref 11.5–15.5)
WBC: 5.2 10*3/uL (ref 4.0–10.5)
WBC: 5.3 10*3/uL (ref 4.0–10.5)

## 2016-07-17 LAB — COMPREHENSIVE METABOLIC PANEL
ALK PHOS: 78 U/L (ref 38–126)
ALT: 10 U/L — AB (ref 14–54)
AST: 22 U/L (ref 15–41)
Albumin: 2.7 g/dL — ABNORMAL LOW (ref 3.5–5.0)
Anion gap: 7 (ref 5–15)
BILIRUBIN TOTAL: 0.5 mg/dL (ref 0.3–1.2)
BUN: 16 mg/dL (ref 6–20)
CALCIUM: 8.8 mg/dL — AB (ref 8.9–10.3)
CHLORIDE: 106 mmol/L (ref 101–111)
CO2: 22 mmol/L (ref 22–32)
CREATININE: 0.83 mg/dL (ref 0.44–1.00)
GFR calc Af Amer: >60 mL/min (ref >60)
Glucose, Bld: 110 mg/dL — ABNORMAL HIGH (ref 65–99)
Potassium: 4.5 mmol/L (ref 3.5–5.1)
Sodium: 135 mmol/L (ref 135–145)
TOTAL PROTEIN: 6.7 g/dL (ref 6.5–8.1)

## 2016-07-17 LAB — PROTEIN / CREATININE RATIO, URINE
Creatinine, Urine: 287 mg/dL
Protein Creatinine Ratio: 0.11 mg/mg{Cre} (ref 0.00–0.15)
Total Protein, Urine: 33 mg/dL

## 2016-07-17 LAB — ABO/RH: ABO/RH(D): O POS

## 2016-07-17 LAB — RPR: RPR: NONREACTIVE

## 2016-07-17 MED ORDER — FENTANYL CITRATE (PF) 100 MCG/2ML IJ SOLN: 50.0000 ug | INTRAMUSCULAR | Status: DC | PRN

## 2016-07-17 MED ORDER — LACTATED RINGERS IV SOLN
500.0000 mL | INTRAVENOUS | Status: DC | PRN
  Administered 2016-07-18: 500 mL via INTRAVENOUS

## 2016-07-17 MED ORDER — OXYCODONE-ACETAMINOPHEN 5-325 MG PO TABS
2.0000 | ORAL_TABLET | ORAL | Status: DC | PRN
  Administered 2016-07-18: 2 via ORAL
  Filled 2016-07-17: qty 2

## 2016-07-17 MED ORDER — LABETALOL HCL 5 MG/ML IV SOLN: 20.0000 mg | INTRAVENOUS | Status: DC | PRN

## 2016-07-17 MED ORDER — SOD CITRATE-CITRIC ACID 500-334 MG/5ML PO SOLN
30.0000 mL | ORAL | Status: DC | PRN
  Administered 2016-07-17 – 2016-07-18 (×2): 30 mL via ORAL
  Filled 2016-07-17 (×2): qty 15

## 2016-07-17 MED ORDER — OXYTOCIN 40 UNITS IN LACTATED RINGERS INFUSION - SIMPLE MED
2.5000 [IU]/h | INTRAVENOUS | Status: DC
  Filled 2016-07-17: qty 1000

## 2016-07-17 MED ORDER — EPHEDRINE 5 MG/ML INJ: 10.0000 mg | INTRAVENOUS | Status: DC | PRN

## 2016-07-17 MED ORDER — LIDOCAINE HCL (PF) 1 % IJ SOLN
30.0000 mL | INTRAMUSCULAR | Status: AC | PRN
  Administered 2016-07-18: 30 mL via SUBCUTANEOUS
  Filled 2016-07-17: qty 30

## 2016-07-17 MED ORDER — ACETAMINOPHEN 325 MG PO TABS: 650.0000 mg | ORAL_TABLET | ORAL | Status: DC | PRN

## 2016-07-17 MED ORDER — DIPHENHYDRAMINE HCL 50 MG/ML IJ SOLN: 12.5000 mg | INTRAMUSCULAR | Status: DC | PRN

## 2016-07-17 MED ORDER — FENTANYL 2.5 MCG/ML BUPIVACAINE 1/10 % EPIDURAL INFUSION (WH - ANES)
14.0000 mL/h | INTRAMUSCULAR | Status: DC | PRN
  Administered 2016-07-17 – 2016-07-18 (×3): 14 mL/h via EPIDURAL
  Filled 2016-07-17 (×4): qty 100

## 2016-07-17 MED ORDER — LABETALOL HCL 200 MG PO TABS: 400.0000 mg | ORAL_TABLET | Freq: Three times a day (TID) | ORAL | Status: DC

## 2016-07-17 MED ORDER — MISOPROSTOL 25 MCG QUARTER TABLET
25.0000 ug | ORAL_TABLET | ORAL | Status: DC
  Administered 2016-07-17: 25 ug via VAGINAL

## 2016-07-17 MED ORDER — PENICILLIN G POT IN DEXTROSE 60000 UNIT/ML IV SOLN
3.0000 10*6.[IU] | INTRAVENOUS | Status: DC
  Administered 2016-07-17 – 2016-07-18 (×4): 3 10*6.[IU] via INTRAVENOUS
  Filled 2016-07-17 (×7): qty 50

## 2016-07-17 MED ORDER — LACTATED RINGERS IV SOLN
INTRAVENOUS | Status: DC
  Administered 2016-07-17 – 2016-07-18 (×3): via INTRAVENOUS

## 2016-07-17 MED ORDER — HYDRALAZINE HCL 20 MG/ML IJ SOLN: 10.0000 mg | Freq: Once | INTRAMUSCULAR | Status: DC | PRN

## 2016-07-17 MED ORDER — QUETIAPINE FUMARATE 300 MG PO TABS
600.0000 mg | ORAL_TABLET | Freq: Every day | ORAL | Status: DC
  Administered 2016-07-17 – 2016-07-20 (×4): 600 mg via ORAL
  Filled 2016-07-17 (×4): qty 2

## 2016-07-17 MED ORDER — TERBUTALINE SULFATE 1 MG/ML IJ SOLN: 0.2500 mg | Freq: Once | INTRAMUSCULAR | Status: DC | PRN

## 2016-07-17 MED ORDER — LACTATED RINGERS IV SOLN
500.0000 mL | Freq: Once | INTRAVENOUS | Status: AC
  Administered 2016-07-17: 500 mL via INTRAVENOUS

## 2016-07-17 MED ORDER — LIDOCAINE HCL (PF) 1 % IJ SOLN
INTRAMUSCULAR | Status: DC | PRN
  Administered 2016-07-17 (×2): 7 mL via EPIDURAL

## 2016-07-17 MED ORDER — OXYTOCIN 40 UNITS IN LACTATED RINGERS INFUSION - SIMPLE MED
1.0000 m[IU]/min | INTRAVENOUS | Status: DC
  Administered 2016-07-17: 2 m[IU]/min via INTRAVENOUS

## 2016-07-17 MED ORDER — FAMOTIDINE IN NACL 20-0.9 MG/50ML-% IV SOLN
20.0000 mg | Freq: Once | INTRAVENOUS | Status: AC
  Administered 2016-07-17: 20 mg via INTRAVENOUS
  Filled 2016-07-17: qty 50

## 2016-07-17 MED ORDER — HYDROXYZINE HCL 50 MG PO TABS
50.0000 mg | ORAL_TABLET | Freq: Four times a day (QID) | ORAL | Status: DC | PRN
  Filled 2016-07-17: qty 1

## 2016-07-17 MED ORDER — PENICILLIN G POTASSIUM 5000000 UNITS IJ SOLR
5.0000 10*6.[IU] | Freq: Once | INTRAVENOUS | Status: AC
  Administered 2016-07-17: 5 10*6.[IU] via INTRAVENOUS
  Filled 2016-07-17: qty 5

## 2016-07-17 MED ORDER — OXYCODONE-ACETAMINOPHEN 5-325 MG PO TABS: 1.0000 | ORAL_TABLET | ORAL | Status: DC | PRN

## 2016-07-17 MED ORDER — ONDANSETRON HCL 4 MG/2ML IJ SOLN: 4.0000 mg | Freq: Four times a day (QID) | INTRAMUSCULAR | Status: DC | PRN

## 2016-07-17 MED ORDER — FLEET ENEMA 7-19 GM/118ML RE ENEM: 1.0000 | ENEMA | RECTAL | Status: DC | PRN

## 2016-07-17 MED ORDER — MISOPROSTOL 25 MCG QUARTER TABLET
25.0000 ug | ORAL_TABLET | ORAL | Status: DC
  Filled 2016-07-17: qty 1

## 2016-07-17 MED ORDER — BUTORPHANOL TARTRATE 1 MG/ML IJ SOLN
1.0000 mg | INTRAMUSCULAR | Status: DC | PRN
  Administered 2016-07-17 – 2016-07-18 (×2): 1 mg via INTRAVENOUS
  Filled 2016-07-17 (×2): qty 1

## 2016-07-17 MED ORDER — GABAPENTIN 300 MG PO CAPS
300.0000 mg | ORAL_CAPSULE | Freq: Every day | ORAL | Status: DC
  Administered 2016-07-17 – 2016-07-20 (×4): 300 mg via ORAL
  Filled 2016-07-17 (×4): qty 1

## 2016-07-17 MED ORDER — LAMOTRIGINE 150 MG PO TABS
300.0000 mg | ORAL_TABLET | Freq: Every evening | ORAL | Status: DC
  Administered 2016-07-17 – 2016-07-20 (×4): 300 mg via ORAL
  Filled 2016-07-17 (×4): qty 2

## 2016-07-17 MED ORDER — OXYTOCIN BOLUS FROM INFUSION
500.0000 mL | Freq: Once | INTRAVENOUS | Status: AC
  Administered 2016-07-18: 500 mL via INTRAVENOUS

## 2016-07-17 MED ORDER — PHENYLEPHRINE 40 MCG/ML (10ML) SYRINGE FOR IV PUSH (FOR BLOOD PRESSURE SUPPORT)
80.0000 ug | PREFILLED_SYRINGE | INTRAVENOUS | Status: DC | PRN
  Filled 2016-07-17: qty 10

## 2016-07-17 MED ORDER — PHENYLEPHRINE 40 MCG/ML (10ML) SYRINGE FOR IV PUSH (FOR BLOOD PRESSURE SUPPORT): 80.0000 ug | PREFILLED_SYRINGE | INTRAVENOUS | Status: DC | PRN

## 2016-07-17 MED ORDER — LABETALOL HCL 200 MG PO TABS
400.0000 mg | ORAL_TABLET | Freq: Three times a day (TID) | ORAL | Status: DC
  Administered 2016-07-17 (×2): 400 mg via ORAL
  Filled 2016-07-17 (×2): qty 2

## 2016-07-17 NOTE — H&P (Signed)
LABOR AND DELIVERY ADMISSION HISTORY AND PHYSICAL NOTE  Casey Lang is a 36 y.o. female G2P0010 with IUP at [redacted]w[redacted]d by LMP presenting for IOL for gHTN. She reports +FM, + contractions, No LOF, no VB, no blurry vision, headaches or peripheral edema, and RUQ pain.  She plans on breast feeding. She request BTL for birth control, consent signed 06/18/2016.  Of note her psychiatric history is significant for bipolar disorder with psychotic features, GAD, and conversion disorder with pseudoseizures.   Prenatal History/Complications:  Past Medical History: Past Medical History:  Diagnosis Date  . Anxiety   . Bipolar 1 disorder ([place])   . Depression   . Hypertension    pregnancy induced hypertension  . Mental disorder   . Right foot injury 10/09/2012   Resolved without complication    Past Surgical History: Past Surgical History:  Procedure Laterality Date  . FOOT SURGERY    . LAPAROSCOPIC GASTRIC SLEEVE RESECTION  10/09/2012   Surgery was on 09/30/2011    Obstetrical History: OB History    Gravida Para Term Preterm AB Living   2       1 0   SAB TAB Ectopic Multiple Live Births                  Social History: Social History   Social History  . Marital status: Single    Spouse name: N/A  . Number of children: N/A  . Years of education: N/A   Social History Main Topics  . Smoking status: Never Smoker  . Smokeless tobacco: Never Used  . Alcohol use No  . Drug use: No  . Sexual activity: Yes    Birth control/ protection: None   Other Topics Concern  . None   Social History Narrative  . None    Family History: Family History  Problem Relation Age of Onset  . Hypertension Mother   . Diabetes Mother   . Diabetes Father   . Hypertension Father   . Anxiety disorder Other   . Depression Other   . Aneurysm Maternal Grandmother   . Alzheimer's disease Paternal Grandmother   . Cancer Paternal Grandmother   . Seizures Neg Hx     Allergies: Allergies  Allergen  Reactions  . Morphine And Related Other (See Comments)    Reaction:  Psychosis     Prescriptions Prior to Admission  Medication Sig Dispense Refill Last Dose  . aspirin 81 MG chewable tablet Chew 1 tablet (81 mg total) by mouth daily. 60 tablet 1 07/16/2016 at Unknown time  . folic acid (FOLVITE) 1 MG tablet Take 4 tablets (4 mg total) by mouth daily. 120 tablet 5 07/16/2016 at Unknown time  . gabapentin (NEURONTIN) 300 MG capsule Take 300 mg by mouth at bedtime.   07/16/2016 at Unknown time  . labetalol (NORMODYNE) 200 MG tablet Take 2 tablets (400 mg total) by mouth 2 (two) times daily. (Patient taking differently: Take 400 mg by mouth 3 (three) times daily. ) 90 tablet 2 07/17/2016 at 0600  . lamoTRIgine (LAMICTAL) 150 MG tablet Take 2 tablets (300 mg total) by mouth every evening. 60 tablet 0 07/16/2016 at Unknown time  . Prenat-FeAsp-Meth-FA-DHA w/o A (PRENATE PIXIE) 10-0.6-0.4-200 MG CAPS Take 1 capsule by mouth daily.   07/16/2016 at Unknown time  . QUEtiapine (SEROQUEL) 300 MG tablet Take 600 mg by mouth at bedtime.   07/16/2016 at Unknown time  . traZODone (DESYREL) 100 MG tablet Take 100 mg by mouth at bedtime.  07/16/2016 at Unknown time  . calcium carbonate (TUMS - DOSED IN MG ELEMENTAL CALCIUM) 500 MG chewable tablet Chew 2 tablets by mouth daily.   07/02/2016  . omega-3 acid ethyl esters (LOVAZA) 1 g capsule Take 1 g by mouth daily.    07/15/2016  . terconazole (TERAZOL 3) 0.8 % vaginal cream Place 1 applicator vaginally at bedtime. Apply nightly for three nights. 20 g 0 07/14/2016     Review of Systems   All systems reviewed and negative except as stated in HPI  Physical Exam Blood pressure (!) 147/110, pulse 79, temperature 98.2 F (36.8 C), last menstrual period 11/03/2015. General appearance: alert, cooperative and appears stated age Lungs: clear to auscultation bilaterally Heart: regular rate and rhythm Abdomen: soft, non-tender; bowel sounds normal Extremities: No calf swelling or  tenderness  Fetal monitoring: 120 bmp, mod variability, accels no decels Uterine activity: occasional contractions     Prenatal labs: ABO, Rh: --/--/O POS, O POS (06/06 0735) Antibody: NEG (06/06 0735) Rubella: IMMUNE RPR: Non Reactive (04/05 1130)  HBsAg: Negative (03/15 1138)  HIV: Non Reactive (04/05 1130)  GBS:   Pos 1 hr Glucola: normal Genetic screening:  normal Anatomy US: normal  Prenatal Transfer Tool  Maternal Diabetes: No Genetic Screening: Normal Maternal Ultrasounds/Referrals: Normal Fetal Ultrasounds or other Referrals:  None Maternal Substance Abuse:  No Significant Maternal Medications:  Meds include: Other: Labetalol, Trazodone, Lamictal, Seroquel Significant Maternal Lab Results: Lab values include: Group B Strep positive  Results for orders placed or performed during the hospital encounter of 07/17/16 (from the past 24 hour(s))  CBC   Collection Time: 07/17/16  7:35 AM  Result Value Ref Range   WBC 5.2 4.0 - 10.5 K/uL   RBC 3.64 (L) 3.87 - 5.11 MIL/uL   Hemoglobin 10.4 (L) 12.0 - 15.0 g/dL   HCT 30.8 (L) 36.0 - 46.0 %   MCV 84.6 78.0 - 100.0 fL   MCH 28.6 26.0 - 34.0 pg   MCHC 33.8 30.0 - 36.0 g/dL   RDW 15.3 11.5 - 15.5 %   Platelets 281 150 - 400 K/uL  Type and screen [place]   Collection Time: 07/17/16  7:35 AM  Result Value Ref Range   ABO/RH(D) O POS    Antibody Screen NEG    Sample Expiration 07/20/2016   ABO/Rh   Collection Time: 07/17/16  7:35 AM  Result Value Ref Range   ABO/RH(D) O POS     Patient Active Problem List   Diagnosis Date Noted  . Gestational hypertension 07/17/2016  . Gestational hypertension, third trimester 05/03/2016  . GBS (group B Streptococcus carrier), +RV culture, currently pregnant 05/02/2016  . Supervision of high risk pregnancy, antepartum 04/25/2016  . 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 AMA (advanced maternal age) multigravida 35+ 04/25/2016  . Conversion disorder with attacks or  seizures 04/25/2016  . Bipolar I disorder, most recent episode (or current) manic ([place]) 03/22/2015  . PTSD (post-traumatic stress disorder)   . GAD (generalized anxiety disorder)   . Hyperprolactinemia ([place]) 03/02/2015  . Bipolar disorder, curr episode mixed, severe, with psychotic features ([place]) 02/28/2015    Assessment: Casey Lang is a 36 y.o. G2P0010 at [redacted]w[redacted]d here for IOL for gHTN  gHTN - HTN protocol - check preE labs - no sx preE symptoms at this time  Labor #Labor: starting with cytotec, plan for foley when 1-2 cm dilated #Pain: Planning for epidural #FWB: Category I  #ID:  GBS pos (PCN) #MOF: bottle (per patient  psychiatry recommended against breast feeding with psychiatric medication regimen) #MOC: BTL (consent 5/8) #Circ:  outpt  Everrett Coombe, MD PGY-1 [facility]  07/17/2016, 10:00 AM  Midwife attestation: I have seen and examined this patient; I agree with above documentation in the resident's note.   Casey Lang is a 36 y.o. G2P0010 here for IOL for GHTN  PE: Gen: calm comfortable, NAD Resp: normal effort, no distress Abd: gravid  ROS, labs, PMH reviewed  Assessment/Plan: Admit to LD Labor: latent FWB: Cat I ID: GBS Pos  Julianne Handler, CNM  07/17/2016, 11:52 AM

## 2016-07-17 NOTE — Anesthesia Preprocedure Evaluation (Signed)
Anesthesia Evaluation  Patient identified by MRN, date of birth, ID band Patient awake    Reviewed: Allergy & Precautions, H&P , NPO status , Patient's Chart, lab work & pertinent test results  Airway Mallampati: III  TM Distance: >3 FB Neck ROM: full    Dental no notable dental hx. (+) Teeth Intact   Pulmonary neg pulmonary ROS,    Pulmonary exam normal breath sounds clear to auscultation       Cardiovascular hypertension, Normal cardiovascular exam Rhythm:regular Rate:Normal     Neuro/Psych Anxiety Depression Bipolar Disorder    GI/Hepatic negative GI ROS, Neg liver ROS,   Endo/Other  Morbid obesity  Renal/GU negative Renal ROS     Musculoskeletal   Abdominal (+) + obese,   Peds  Hematology  (+) Blood dyscrasia, anemia ,   Anesthesia Other Findings   Reproductive/Obstetrics (+) Pregnancy                             Anesthesia Physical Anesthesia Plan  ASA: III  Anesthesia Plan: Epidural   Post-op Pain Management:    Induction:   PONV Risk Score and Plan:   Airway Management Planned:   Additional Equipment:   Intra-op Plan:   Post-operative Plan:   Informed Consent: I have reviewed the patients History and Physical, chart, labs and discussed the procedure including the risks, benefits and alternatives for the proposed anesthesia with the patient or authorized representative who has indicated his/her understanding and acceptance.     Plan Discussed with:   Anesthesia Plan Comments:         Anesthesia Quick Evaluation

## 2016-07-17 NOTE — Anesthesia Pain Management Evaluation Note (Signed)
  CRNA Pain Management Visit Note  Patient: Casey Lang, 36 y.o., female  "Hello I am a member of the anesthesia team at [facility]. We have an anesthesia team available at all times to provide care throughout the hospital, including epidural management and anesthesia for C-section. I don't know your plan for the delivery whether it a natural birth, water birth, IV sedation, nitrous supplementation, doula or epidural, but we want to meet your pain goals."   1.Was your pain managed to your expectations on prior hospitalizations?   No prior hospitalizations  2.What is your expectation for pain management during this hospitalization?     Epidural  3.How can we help you reach that goal? epidural  Record the patient's initial score and the patient's pain goal.   Pain: 0  Pain Goal: 5 [facility] wants you to be able to say your pain was always managed very well.  Margarito Dehaas 07/17/2016

## 2016-07-17 NOTE — Progress Notes (Signed)
I received a referral from pt's nurse due to the family's significant history of loss and patient's significant mental health history.  I offered support to Casey Lang and her family over 2 visits including prayer, at their request.  Their pastor also came to see them today and I met several family members who were eagerly awaiting the arrival of baby Casey Lang.    We will follow up after delivery, but please page if needs arise sooner.  Justice, [place] Pager, [phone] 4:17 PM    07/17/16 1600  Clinical Encounter Type  Visited With Patient and family together  Visit Type Spiritual support  Referral From Nurse  Spiritual Encounters  Spiritual Needs Prayer

## 2016-07-17 NOTE — Progress Notes (Signed)
Patient ID: Casey Lang, female   DOB: 26-Sep-1980, 36 y.o.   MRN: [id number]  S: Patient seen & examined for progress of labor. Patient comfortable with epidural.    O:  Vitals:   07/17/16 1958 07/17/16 2000 07/17/16 2030 07/17/16 2055  BP: (!) 137/99 (!) 134/94 (!) 143/103 121/71  Pulse: 73 75 87 74  Resp:      Temp:  98 F (36.7 C)    TempSrc:  Oral      Dilation: 4.5 Effacement (%): 70 Cervical Position: Middle Station: -2 Presentation: Vertex Exam by:: m Wilkins  AROM performed, clear fluid returned. 4.5/70/-3  FHT: 140 bpm, mod var, no accels, no decels TOCO: q2-74min   A/P: AROM performed, clear fluid Continue pitocin Continue expectant management Anticipate SVD

## 2016-07-17 NOTE — Progress Notes (Signed)
Patient ID: Casey Lang, female   DOB: [birth year], 36 y.o.   MRN: [id number]  S: Patient seen & examined for progress of labor. Patient comfortable with epidural, sleeping on her side.  Unable to pick up contractions anymore, also unable to palpate contractions.   O:  Vitals:   07/17/16 2200 07/17/16 2218 07/17/16 2230 07/17/16 2300  BP: (!) 148/92 (!) 148/92 (!) 145/96 123/68  Pulse: 76 79 82 77  Resp:      Temp:    98.2 F (36.8 C)  TempSrc:        Dilation: 4.5 Effacement (%): 70 Cervical Position: Middle Station: -2 Presentation: Vertex Exam by:: Baden Betsch MD Clear blood-tinged fluid return  FHT: 120bpm, mod var, +accels, a couple variable decels with return to baseline. TOCO: Unable to determine, after IUPC placed, q1.5-2 min  IUPC placed with ease.  A/P: IUPC placed Continue pitocin per protocol Continue expectant management Anticipate SVD

## 2016-07-17 NOTE — Progress Notes (Signed)
Labor Progress Note Casey Lang is a 36 y.o. G2P0010 at 109w0d presented for IOL for gHTN. S/p Cytotec #1.  S:  Starting to feel ctx, rates pain 2/10.  O:  BP (!) 149/101   Pulse 79   Temp 98.2 F (36.8 C)   LMP 11/03/2015 (Approximate)  EFM: baseline 130 bpm/ mod variability/ + accels/ no decels  Toco: 1-4 SVE: Dilation: 1 Presentation: Vertex Exam by:: Natacha Jepsen   A/P: 36 y.o. G2P0010 [redacted]w[redacted]d  1. Labor: latent 2. FWB: Cat I 3. Pain: analgesia/anesthesia Foley balloon placed, tolerated well. Anticipate cervical ripening then consider Pitocin. Pre-e labs negative, no severe range BPs, continue po Labetalol.  Julianne Handler, CNM 2:53 PM

## 2016-07-17 NOTE — Anesthesia Procedure Notes (Signed)
Epidural Patient location during procedure: OB Start time: 07/17/2016 3:30 PM End time: 07/17/2016 3:34 PM  Staffing Anesthesiologist: Lyn Hollingshead Performed: anesthesiologist   Preanesthetic Checklist Completed: patient identified, surgical consent, pre-op evaluation, timeout performed, IV checked, risks and benefits discussed and monitors and equipment checked  Epidural Patient position: sitting Prep: site prepped and draped and DuraPrep Patient monitoring: continuous pulse ox and blood pressure Approach: midline Location: L3-L4 Injection technique: LOR air  Needle:  Needle type: Tuohy  Needle gauge: 17 G Needle length: 9 cm and 9 Needle insertion depth: 8 cm Catheter type: closed end flexible Catheter size: 19 Gauge Catheter at skin depth: 13 cm Test dose: negative and Other  Assessment Sensory level: T9 Events: blood not aspirated, injection not painful, no injection resistance, negative IV test and no paresthesia  Additional Notes Reason for block:procedure for pain

## 2016-07-17 NOTE — Progress Notes (Signed)
CSW acknowledges consult for Bipolar and Anxiety.  A CSW will meet with patient after delivery unless patient requests to speak with CSW prior to delivery.

## 2016-07-18 ENCOUNTER — Encounter (HOSPITAL_COMMUNITY): Payer: Self-pay

## 2016-07-18 ENCOUNTER — Encounter (HOSPITAL_COMMUNITY)
Admission: RE | Disposition: A | Discharge status: Home / Self Care | Payer: Self-pay | Source: Ambulatory Visit | Attending: Family Medicine

## 2016-07-18 ENCOUNTER — Inpatient Hospital Stay (HOSPITAL_COMMUNITY): Payer: Medicaid Other | Admitting: Anesthesiology

## 2016-07-18 DIAGNOSIS — O134 Gestational [pregnancy-induced] hypertension without significant proteinuria, complicating childbirth: Secondary | ICD-10-CM

## 2016-07-18 DIAGNOSIS — Z3A37 37 weeks gestation of pregnancy: Secondary | ICD-10-CM

## 2016-07-18 DIAGNOSIS — Z302 Encounter for sterilization: Secondary | ICD-10-CM

## 2016-07-18 DIAGNOSIS — O99824 Streptococcus B carrier state complicating childbirth: Secondary | ICD-10-CM

## 2016-07-18 HISTORY — PX: HEMATOMA EVACUATION: SHX5118

## 2016-07-18 HISTORY — PX: TUBAL LIGATION: SHX77

## 2016-07-18 LAB — CBC
HCT: 20.6 % — ABNORMAL LOW (ref 36.0–46.0)
HCT: 22.9 % — ABNORMAL LOW (ref 36.0–46.0)
Hemoglobin: 6.6 g/dL — CL (ref 12.0–15.0)
Hemoglobin: 7.8 g/dL — ABNORMAL LOW (ref 12.0–15.0)
MCH: 27.2 pg (ref 26.0–34.0)
MCH: 28.9 pg (ref 26.0–34.0)
MCHC: 32 g/dL (ref 30.0–36.0)
MCHC: 34.1 g/dL (ref 30.0–36.0)
MCV: 84.8 fL (ref 78.0–100.0)
MCV: 84.8 fL (ref 78.0–100.0)
PLATELETS: 204 10*3/uL (ref 150–400)
PLATELETS: 191 10*3/uL (ref 150–400)
RBC: 2.43 MIL/uL — AB (ref 3.87–5.11)
RBC: 2.7 MIL/uL — AB (ref 3.87–5.11)
RDW: 15.4 % (ref 11.5–15.5)
RDW: 14.8 % (ref 11.5–15.5)
WBC: 11.1 10*3/uL — AB (ref 4.0–10.5)
WBC: 12.4 10*3/uL — AB (ref 4.0–10.5)

## 2016-07-18 LAB — PREPARE RBC (CROSSMATCH)

## 2016-07-18 SURGERY — EVACUATION HEMATOMA
Anesthesia: Epidural | Site: Vagina | Wound class: Clean Contaminated

## 2016-07-18 MED ORDER — SODIUM CHLORIDE 0.9% FLUSH: 3.0000 mL | INTRAVENOUS | Status: DC | PRN

## 2016-07-18 MED ORDER — SODIUM CHLORIDE 0.9 % IV SOLN: 250.0000 mL | INTRAVENOUS | Status: DC | PRN

## 2016-07-18 MED ORDER — LIDOCAINE-EPINEPHRINE (PF) 2 %-1:200000 IJ SOLN
INTRAMUSCULAR | Status: DC | PRN
  Administered 2016-07-18: 10 mL via PERINEURAL
  Administered 2016-07-18 (×2): 5 mL via PERINEURAL

## 2016-07-18 MED ORDER — OXYCODONE HCL 5 MG PO TABS: 5.0000 mg | ORAL_TABLET | Freq: Once | ORAL | Status: DC | PRN

## 2016-07-18 MED ORDER — FENTANYL CITRATE (PF) 100 MCG/2ML IJ SOLN
INTRAMUSCULAR | Status: DC | PRN
  Administered 2016-07-18 (×2): 50 ug via INTRAVENOUS

## 2016-07-18 MED ORDER — ACETAMINOPHEN 325 MG PO TABS
650.0000 mg | ORAL_TABLET | ORAL | Status: DC | PRN
  Administered 2016-07-21: 650 mg via ORAL
  Filled 2016-07-18: qty 2

## 2016-07-18 MED ORDER — SIMETHICONE 80 MG PO CHEW
80.0000 mg | CHEWABLE_TABLET | ORAL | Status: DC | PRN
  Administered 2016-07-19: 80 mg via ORAL
  Filled 2016-07-18: qty 1

## 2016-07-18 MED ORDER — ONDANSETRON HCL 4 MG PO TABS: 4.0000 mg | ORAL_TABLET | ORAL | Status: DC | PRN

## 2016-07-18 MED ORDER — SODIUM CHLORIDE 0.9 % IV SOLN: 10.0000 mL/h | Freq: Once | INTRAVENOUS | Status: DC

## 2016-07-18 MED ORDER — ONDANSETRON HCL 4 MG/2ML IJ SOLN
INTRAMUSCULAR | Status: AC
Start: 2016-07-18 — End: 2016-07-18
  Filled 2016-07-18: qty 2

## 2016-07-18 MED ORDER — MIDAZOLAM HCL 2 MG/2ML IJ SOLN
INTRAMUSCULAR | Status: AC
  Filled 2016-07-18: qty 2

## 2016-07-18 MED ORDER — SODIUM CHLORIDE 0.9% FLUSH: 3.0000 mL | Freq: Two times a day (BID) | INTRAVENOUS | Status: DC

## 2016-07-18 MED ORDER — LACTATED RINGERS IV SOLN
INTRAVENOUS | Status: DC
  Administered 2016-07-18: 02:00:00 via INTRAUTERINE

## 2016-07-18 MED ORDER — BUPIVACAINE HCL (PF) 0.25 % IJ SOLN
INTRAMUSCULAR | Status: AC
  Filled 2016-07-18: qty 30

## 2016-07-18 MED ORDER — TETANUS-DIPHTH-ACELL PERTUSSIS 5-2.5-18.5 LF-MCG/0.5 IM SUSP: 0.5000 mL | Freq: Once | INTRAMUSCULAR | Status: DC

## 2016-07-18 MED ORDER — HYDROMORPHONE HCL 1 MG/ML IJ SOLN
1.0000 mg | INTRAMUSCULAR | Status: AC
  Administered 2016-07-18 (×2): 1 mg via INTRAVENOUS
  Filled 2016-07-18 (×2): qty 1

## 2016-07-18 MED ORDER — SCOPOLAMINE 1 MG/3DAYS TD PT72
MEDICATED_PATCH | TRANSDERMAL | Status: DC | PRN
  Administered 2016-07-18: 1 via TRANSDERMAL

## 2016-07-18 MED ORDER — OXYCODONE HCL 5 MG PO TABS
5.0000 mg | ORAL_TABLET | ORAL | Status: DC | PRN
  Administered 2016-07-18 (×2): 5 mg via ORAL
  Filled 2016-07-18 (×2): qty 1

## 2016-07-18 MED ORDER — DEXAMETHASONE SODIUM PHOSPHATE 4 MG/ML IJ SOLN
INTRAMUSCULAR | Status: AC
  Filled 2016-07-18: qty 1

## 2016-07-18 MED ORDER — OXYCODONE HCL 5 MG PO TABS
10.0000 mg | ORAL_TABLET | ORAL | Status: DC | PRN
  Administered 2016-07-19 – 2016-07-21 (×12): 10 mg via ORAL
  Filled 2016-07-18 (×12): qty 2

## 2016-07-18 MED ORDER — DOCUSATE SODIUM 100 MG PO CAPS
100.0000 mg | ORAL_CAPSULE | Freq: Two times a day (BID) | ORAL | Status: DC
  Administered 2016-07-18 – 2016-07-21 (×6): 100 mg via ORAL
  Filled 2016-07-18 (×7): qty 1

## 2016-07-18 MED ORDER — ONDANSETRON HCL 4 MG/2ML IJ SOLN: 4.0000 mg | INTRAMUSCULAR | Status: DC | PRN

## 2016-07-18 MED ORDER — SODIUM CHLORIDE 0.9 % IR SOLN
Status: DC | PRN
  Administered 2016-07-18: 1000 mL

## 2016-07-18 MED ORDER — COCONUT OIL OIL: 1.0000 "application " | TOPICAL_OIL | Status: DC | PRN

## 2016-07-18 MED ORDER — MEPERIDINE HCL 25 MG/ML IJ SOLN: 6.2500 mg | INTRAMUSCULAR | Status: DC | PRN

## 2016-07-18 MED ORDER — LIDOCAINE-EPINEPHRINE (PF) 2 %-1:200000 IJ SOLN
INTRAMUSCULAR | Status: AC
  Filled 2016-07-18: qty 20

## 2016-07-18 MED ORDER — SCOPOLAMINE 1 MG/3DAYS TD PT72
MEDICATED_PATCH | TRANSDERMAL | Status: AC
Start: 2016-07-18 — End: 2016-07-18
  Filled 2016-07-18: qty 1

## 2016-07-18 MED ORDER — LIDOCAINE HCL 1 % IJ SOLN
INTRAMUSCULAR | Status: AC
  Filled 2016-07-18: qty 20

## 2016-07-18 MED ORDER — PROMETHAZINE HCL 25 MG/ML IJ SOLN: 6.2500 mg | INTRAMUSCULAR | Status: DC | PRN

## 2016-07-18 MED ORDER — FENTANYL CITRATE (PF) 100 MCG/2ML IJ SOLN
INTRAMUSCULAR | Status: AC
  Filled 2016-07-18: qty 2

## 2016-07-18 MED ORDER — MIDAZOLAM HCL 2 MG/2ML IJ SOLN
INTRAMUSCULAR | Status: DC | PRN
  Administered 2016-07-18: 2 mg via INTRAVENOUS

## 2016-07-18 MED ORDER — SODIUM BICARBONATE 8.4 % IV SOLN
INTRAVENOUS | Status: AC
  Filled 2016-07-18: qty 50

## 2016-07-18 MED ORDER — ZOLPIDEM TARTRATE 5 MG PO TABS: 5.0000 mg | ORAL_TABLET | Freq: Every evening | ORAL | Status: DC | PRN

## 2016-07-18 MED ORDER — POLYETHYLENE GLYCOL 3350 17 G PO PACK
17.0000 g | PACK | Freq: Every day | ORAL | Status: DC | PRN
  Filled 2016-07-18: qty 1

## 2016-07-18 MED ORDER — SENNOSIDES-DOCUSATE SODIUM 8.6-50 MG PO TABS
2.0000 | ORAL_TABLET | ORAL | Status: DC
  Administered 2016-07-18 – 2016-07-19 (×2): 2 via ORAL
  Filled 2016-07-18 (×3): qty 2

## 2016-07-18 MED ORDER — BUPIVACAINE HCL (PF) 0.25 % IJ SOLN
INTRAMUSCULAR | Status: DC | PRN
  Administered 2016-07-18: 10 mL

## 2016-07-18 MED ORDER — LIDOCAINE-EPINEPHRINE (PF) 2 %-1:200000 IJ SOLN
INTRAMUSCULAR | Status: DC | PRN
  Administered 2016-07-18 (×2): 2 mL via INTRADERMAL
  Administered 2016-07-18: 3 mL
  Administered 2016-07-18: 3 mL via INTRADERMAL

## 2016-07-18 MED ORDER — DIBUCAINE 1 % RE OINT
1.0000 "application " | TOPICAL_OINTMENT | RECTAL | Status: DC | PRN
  Administered 2016-07-20: 1 via RECTAL
  Filled 2016-07-18: qty 28

## 2016-07-18 MED ORDER — DIPHENHYDRAMINE HCL 25 MG PO CAPS: 25.0000 mg | ORAL_CAPSULE | Freq: Four times a day (QID) | ORAL | Status: DC | PRN

## 2016-07-18 MED ORDER — SODIUM CHLORIDE 0.9 % IV SOLN
Freq: Once | INTRAVENOUS | Status: AC
  Administered 2016-07-18: 12:00:00 via INTRAVENOUS

## 2016-07-18 MED ORDER — PRENATAL MULTIVITAMIN CH
1.0000 | ORAL_TABLET | Freq: Every day | ORAL | Status: DC
  Administered 2016-07-19 – 2016-07-21 (×3): 1 via ORAL
  Filled 2016-07-18 (×3): qty 1

## 2016-07-18 MED ORDER — PROMETHAZINE HCL 25 MG/ML IJ SOLN
25.0000 mg | Freq: Four times a day (QID) | INTRAMUSCULAR | Status: DC | PRN
  Administered 2016-07-18: 25 mg via INTRAVENOUS
  Filled 2016-07-18: qty 1

## 2016-07-18 MED ORDER — ONDANSETRON HCL 4 MG/2ML IJ SOLN
INTRAMUSCULAR | Status: DC | PRN
  Administered 2016-07-18: 4 mg via INTRAVENOUS

## 2016-07-18 MED ORDER — IBUPROFEN 600 MG PO TABS
600.0000 mg | ORAL_TABLET | Freq: Four times a day (QID) | ORAL | Status: DC
  Administered 2016-07-18 – 2016-07-21 (×12): 600 mg via ORAL
  Filled 2016-07-18 (×13): qty 1

## 2016-07-18 MED ORDER — HYDROMORPHONE HCL 1 MG/ML IJ SOLN: 0.2500 mg | INTRAMUSCULAR | Status: DC | PRN

## 2016-07-18 MED ORDER — OXYTOCIN 40 UNITS IN LACTATED RINGERS INFUSION - SIMPLE MED: 2.5000 [IU]/h | INTRAVENOUS | Status: DC | PRN

## 2016-07-18 MED ORDER — OXYCODONE HCL 5 MG/5ML PO SOLN: 5.0000 mg | Freq: Once | ORAL | Status: DC | PRN

## 2016-07-18 MED ORDER — DEXAMETHASONE SODIUM PHOSPHATE 4 MG/ML IJ SOLN
INTRAMUSCULAR | Status: DC | PRN
  Administered 2016-07-18: 4 mg via INTRAVENOUS

## 2016-07-18 MED ORDER — DEXTROSE 5 % IV SOLN
INTRAVENOUS | Status: DC | PRN
  Administered 2016-07-18: 3 g via INTRAVENOUS

## 2016-07-18 MED ORDER — WITCH HAZEL-GLYCERIN EX PADS
1.0000 "application " | MEDICATED_PAD | CUTANEOUS | Status: DC | PRN
  Administered 2016-07-20: 1 via TOPICAL

## 2016-07-18 MED ORDER — BENZOCAINE-MENTHOL 20-0.5 % EX AERO
1.0000 "application " | INHALATION_SPRAY | CUTANEOUS | Status: DC | PRN
  Administered 2016-07-20: 1 via TOPICAL
  Filled 2016-07-18: qty 56

## 2016-07-18 SURGICAL SUPPLY — 27 items
BLADE SURG 10 STRL SS (BLADE) ×4 IMPLANT
CHLORAPREP W/TINT 26ML (MISCELLANEOUS) ×4 IMPLANT
CLIP FILSHIE TUBAL LIGA STRL (Clip) ×4 IMPLANT
CLOTH BEACON ORANGE TIMEOUT ST (SAFETY) ×4 IMPLANT
DRSG OPSITE POSTOP 3X4 (GAUZE/BANDAGES/DRESSINGS) ×4 IMPLANT
GLOVE BIOGEL PI IND STRL 7.0 (GLOVE) ×6 IMPLANT
GLOVE BIOGEL PI INDICATOR 7.0 (GLOVE) ×6
GLOVE ECLIPSE 7.0 STRL STRAW (GLOVE) ×8 IMPLANT
GOWN STRL REUS W/TWL LRG LVL3 (GOWN DISPOSABLE) ×8 IMPLANT
HEMOSTAT ARISTA ABSORB 3G PWDR (MISCELLANEOUS) ×4 IMPLANT
NEEDLE HYPO 22GX1.5 SAFETY (NEEDLE) ×4 IMPLANT
NS IRRIG 1000ML POUR BTL (IV SOLUTION) ×4 IMPLANT
PACK ABDOMINAL MINOR (CUSTOM PROCEDURE TRAY) ×4 IMPLANT
PACK VAGINAL MINOR WOMEN LF (CUSTOM PROCEDURE TRAY) ×4 IMPLANT
SPONGE LAP 18X18 X RAY DECT (DISPOSABLE) ×4 IMPLANT
SPONGE LAP 4X18 X RAY DECT (DISPOSABLE) ×8 IMPLANT
SUT MON AB 2-0 CT1 27 (SUTURE) ×4 IMPLANT
SUT VIC AB 0 CT1 27 (SUTURE) ×2
SUT VIC AB 0 CT1 27XBRD ANBCTR (SUTURE) ×2 IMPLANT
SUT VIC AB 3-0 SH 27 (SUTURE) ×2
SUT VIC AB 3-0 SH 27X BRD (SUTURE) ×2 IMPLANT
SYR CONTROL 10ML LL (SYRINGE) ×4 IMPLANT
TOWEL OR 17X24 6PK STRL BLUE (TOWEL DISPOSABLE) ×8 IMPLANT
TRAY FOLEY CATH SILVER 14FR (SET/KITS/TRAYS/PACK) ×4 IMPLANT
TUBING NON-CON 1/4 X 20 CONN (TUBING) ×3 IMPLANT
TUBING NON-CON 1/4 X 20' CONN (TUBING) ×1
YANKAUER SUCT BULB TIP NO VENT (SUCTIONS) ×4 IMPLANT

## 2016-07-18 NOTE — Progress Notes (Signed)
Patient ID: Casey Lang, female   DOB: 1980-08-14, 36 y.o.   MRN: [id number]  S: Called by RN to return to room because pt hematoma appears larger.  Pt pain is improved after IV Dilaudid.  O: BP 112/61   Pulse 83   Temp 98 F (36.7 C) (Oral)   Resp 18   LMP 11/03/2015 (Approximate)   Breastfeeding? Unknown     CBC Latest Ref Rng & Units 07/17/2016 07/17/2016 06/30/2016  WBC 4.0 - 10.5 K/uL 5.3 5.2 5.3  Hemoglobin 12.0 - 15.0 g/dL 10.0(L) 10.4(L) 9.3(L)  Hematocrit 36.0 - 46.0 % 30.1(L) 30.8(L) 28.4(L)  Platelets 150 - 400 K/uL 270 281 253    On visual inspection, hematoma is approximately 12x8 cm in size, skin is firm and taught over area.    A: Vaginal Hematoma increased in size VS stable pain well controlled  P: CBC now Dr Kennon Rounds notified and to evaluate after OR case VS Q 15 minutes   Fatima Blank, CNM 10:25 AM

## 2016-07-18 NOTE — Progress Notes (Signed)
Patient ID: Casey Lang, female   DOB: [birth year], 36 y.o.   MRN: [id number] Asked to see patient due to ever enlarging hematoma, post delivery. This has continued to get larger and patient has significant pain. Hgb is now 6.6. Blood to be available and brought to the OR. Hematoma extends into buttocks. Will move to OR for evacuation. Also desires permanent sterilization. If stable will perform BTL after evacuation. Patient counseled, r.e. Risks benefits of BTL, including permanency of procedure, risk of failure(1:100), increased risk of ectopic.  Patient verbalized understanding and desires to proceed

## 2016-07-18 NOTE — Anesthesia Preprocedure Evaluation (Signed)
Anesthesia Evaluation  Patient identified by MRN, date of birth, ID band Patient awake    Reviewed: Allergy & Precautions, H&P , NPO status , Patient's Chart, lab work & pertinent test results  Airway Mallampati: III  TM Distance: >3 FB Neck ROM: full    Dental no notable dental hx. (+) Teeth Intact   Pulmonary neg pulmonary ROS,    Pulmonary exam normal breath sounds clear to auscultation       Cardiovascular hypertension, Normal cardiovascular exam Rhythm:regular Rate:Normal     Neuro/Psych Anxiety Depression Bipolar Disorder    GI/Hepatic negative GI ROS, Neg liver ROS,   Endo/Other  Morbid obesity  Renal/GU negative Renal ROS     Musculoskeletal   Abdominal (+) + obese,   Peds  Hematology  (+) Blood dyscrasia, anemia ,   Anesthesia Other Findings   Reproductive/Obstetrics (+) Pregnancy                             Anesthesia Physical  Anesthesia Plan  ASA: III  Anesthesia Plan: Epidural   Post-op Pain Management:    Induction:   PONV Risk Score and Plan:   Airway Management Planned:   Additional Equipment:   Intra-op Plan:   Post-operative Plan:   Informed Consent: I have reviewed the patients History and Physical, chart, labs and discussed the procedure including the risks, benefits and alternatives for the proposed anesthesia with the patient or authorized representative who has indicated his/her understanding and acceptance.     Plan Discussed with: CRNA  Anesthesia Plan Comments:         Anesthesia Quick Evaluation

## 2016-07-18 NOTE — Progress Notes (Signed)
Patient ID: Casey Lang, female   DOB: [birth year], 36 y.o.   MRN: [id number]  S:Called by RN b/c pt in significant pain when getting up to use bedpan. Pt did not receive Dilaudid IV as ordered because of a morphine allergy.  Pt reports psychosis with dose of Morphine in 2005 after surgery.  Discussed reaction with patient, she reports it made her dizzy and caused her to fall down.    O: BP (!) 144/98   Pulse 84   Temp 97.4 F (36.3 C) (Oral)   Resp 18   LMP 11/03/2015 (Approximate)   Breastfeeding? Unknown    VS reviewed, nursing note reviewed,  Constitutional: well developed, well nourished, mild distress HEENT: normocephalic CV: normal rate Pulm/chest wall: normal effort Abdomen: soft Neuro: alert and oriented x 3 Skin: warm, dry Psych: affect normal  A/P: Consult Dr Kennon Rounds regarding pt allergy.  Due to pt high level of pain, benefits outweigh risks of medication and no true allergic reaction was noted. RN to assist pt when ambulating while on medication.  Fatima Blank, CNM 9:51 AM

## 2016-07-18 NOTE — Transfer of Care (Signed)
Immediate Anesthesia Transfer of Care Note  Patient: Casey Lang  Procedure(s) Performed: Procedure(s): POST PARTUM TUBAL LIGATION (Bilateral) EVACUATION HEMATOMA (N/A)  Patient Location: PACU  Anesthesia Type:Epidural  Level of Consciousness: awake, alert  and oriented  Airway & Oxygen Therapy: Patient Spontanous Breathing  Post-op Assessment: Report given to RN and Post -op Vital signs reviewed and stable  Post vital signs: Reviewed and stable  Last Vitals:  Vitals:   07/18/16 1116 07/18/16 1307  BP: 121/80   Pulse: 81 77  Resp: 18   Temp:  36.4 C    Last Pain:  Vitals:   07/18/16 1016  TempSrc: Oral  PainSc: 8       Patients Stated Pain Goal: 3 (37/54/36 0677)  Complications: No apparent anesthesia complications

## 2016-07-18 NOTE — Anesthesia Postprocedure Evaluation (Signed)
Anesthesia Post Note  Patient: Casey Lang  Procedure(s) Performed: Procedure(s) (LRB): POST PARTUM TUBAL LIGATION (Bilateral) EVACUATION HEMATOMA (N/A)     Patient location during evaluation: PACU Anesthesia Type: Epidural Level of consciousness: awake and alert Pain management: pain level controlled Vital Signs Assessment: post-procedure vital signs reviewed and stable Respiratory status: spontaneous breathing, nonlabored ventilation and respiratory function stable Cardiovascular status: stable Postop Assessment: no headache, no backache and epidural receding Anesthetic complications: no    Last Vitals:  Vitals:   07/18/16 1430 07/18/16 1449  BP: 117/83 125/80  Pulse: 60 90  Resp: 14 14  Temp: 36.4 C 36.4 C    Last Pain:  Vitals:   07/18/16 1449  TempSrc:   PainSc: 0-No pain   Pain Goal: Patients Stated Pain Goal: 3 (07/18/16 0600)               Nolon Nations

## 2016-07-18 NOTE — Anesthesia Postprocedure Evaluation (Signed)
Anesthesia Post Note  Patient: Casey Lang  Procedure(s) Performed: * No procedures listed *     Patient location during evaluation: Mother Baby Anesthesia Type: Epidural Level of consciousness: awake and alert and oriented Pain management: pain level controlled Vital Signs Assessment: post-procedure vital signs reviewed and stable Respiratory status: spontaneous breathing and nonlabored ventilation Cardiovascular status: stable Postop Assessment: no headache, patient able to bend at knees, no backache, no signs of nausea or vomiting, epidural receding and adequate PO intake Anesthetic complications: no    Last Vitals:  Vitals:   07/18/16 1430 07/18/16 1449  BP: 117/83 125/80  Pulse: 60 90  Resp: 14 14  Temp: 36.4 C 36.4 C    Last Pain:  Vitals:   07/18/16 1500  TempSrc:   PainSc: 0-No pain   Pain Goal: Patients Stated Pain Goal: 3 (07/18/16 0600)               Baley Shands Hristova

## 2016-07-18 NOTE — Progress Notes (Signed)
Labor Progress Note  Mary-Anne Polizzi is a 36 y.o. G2P0010 at [redacted]w[redacted]d admitted for induction of labor due to gestational hypertension.  S: Patient appears uncomfortable and states she has 8/10 suprapubic pain during contractions. Minimal pain in between.   O:  BP (!) 145/102   Pulse 75   Temp 98.4 F (36.9 C) (Oral)   Resp 16   LMP 11/03/2015 (Approximate)   Total I/O In: -  Out: 1000 [Urine:1000]  FHT: FHR: 140bpm; variability: moderate; accelerations: absent; decelerations: absent UC:   regular, every 2-3 minutes. MVU 200-220.    SVE:   Dilation: 6 Effacement (%): 80 Station: -1 Exam by:: Royetta Crochet RN  Labs: Lab Results  Component Value Date   WBC 5.3 07/17/2016   HGB 10.0 (L) 07/17/2016   HCT 30.1 (L) 07/17/2016   MCV 84.3 07/17/2016   PLT 270 07/17/2016    Assessment / Plan: 36 y.o. G2P0010 [redacted]w[redacted]d admitted for IOL due to gestational hypertension.   Labor: Progressing; Pit off due to variable decels with improvement  Fetal Wellbeing:  Category I Pain Control:  Epidural Anticipated MOD:  NSVD  Lurlean Leyden, MS3   Shortly after leaving room, patient became complete and is ready to push.  Zenda Alpers, DO  [facility]

## 2016-07-18 NOTE — Progress Notes (Signed)
After evaluating size of hematoma, Dr Kennon Rounds recommended evacuating hematoma and will preform bilateral tubal ligation at the same time. Pt is in agreement and will be prepared for OR.

## 2016-07-18 NOTE — Op Note (Signed)
Preoperative diagnosis: Postpartum hematoma with falling Hgb, severe vulvar pain, undesired fertility  Postoperative diagnosis: Same  Procedure: Exam under anesthesia, evacuation of hematoma and repair, postpartum tubal sterilization with Filshie clips  Surgeon: Standley Dakins. Kennon Rounds, M.D.  Anesthesia: Epidural, IV sedation, Nolon Nations, MD, MD  Findings: Macerated vagina, multiple lacerations with lots of bleeding including several arterial bleeders  EBL: 1.2L (1 lb 0.8 oz of clot removed)  Reason for procedure: Patient is a 36 y.o. G2P1011 who is several hours postpartum from delivery of viable  Infant who has an ever-expanding hematoma causing severe pain and dropping Hgb to 6.6. Patient also desires permanent sterilization.  Procedure: Patient was placed in dorsal lithotomy after spinal analgesia. A Foley catheter was in place. A timeout was performed. The vagina was prepped. The large hematoma into the left gluteus and lower left vagina was opened with a knife. Large amount of clot was removed from this incision with exploration occurring superiorly and inferiorly.There was no arterial bleeder noted but the evacuated site continue to have bleeding, oozing and Monocryl was used in multiple figures-of-eight sutures to bring the space together and to affect hemostasis. Multiple figures-of-eight until bleeding was controlled. Once most of the bleeding was controlled the space was coated with Arista. Attention then turned to the abdomen. She was then placed in a supine position and prepped and draped in the usual sterile fashion.  A small transverse skin incision was made under the umbilical fold. The incision was taken down to the layer of fascia using the scalpel, and fascia was incised, and extended bilaterally. The peritoneum was entered in a sharp fashion. The patient was placed in Trendelenburg.  A moist lap pad was used to move omentum and bowel away until the left fallopian tube was identified  and grasped with a Babcock clamp, and followed out to the fimbriated end.  A Filshie clip was placed on the left fallopian tube about 2 cm from the cornu.  A similar process was carried out on the right side allowing for bilateral tubal sterilization.  Good hemostasis was noted overall.  Local analgesia was injected into both Filshie application sites.The instruments were then removed from the patient's abdomen and the fascial incision was repaired with 0 Vicryl, and the skin was closed with a 4-0 Vicryl subcuticular stitch. Attention was returned to the perineum, where there was no active bleeding and no re-accumulation of hematoma noted. There was still some edema of the gluteus but it was no longer firm. Patient tolerated the procedure well. All instrument, needle, and lap counts were correct x 2.  Donnamae Jude, MD 07/18/2016, 12:52 PM

## 2016-07-19 ENCOUNTER — Encounter (HOSPITAL_COMMUNITY): Payer: Self-pay | Admitting: Family Medicine

## 2016-07-19 ENCOUNTER — Encounter (HOSPITAL_COMMUNITY)
Admission: RE | Disposition: A | Discharge status: Home / Self Care | Payer: Self-pay | Source: Ambulatory Visit | Attending: Family Medicine

## 2016-07-19 LAB — PREPARE RBC (CROSSMATCH)

## 2016-07-19 SURGERY — LIGATION, FALLOPIAN TUBE, POSTPARTUM
Anesthesia: Epidural | Laterality: Bilateral

## 2016-07-19 MED ORDER — SODIUM CHLORIDE 0.9 % IV SOLN
Freq: Once | INTRAVENOUS | Status: AC
  Administered 2016-07-19: 10:00:00 via INTRAVENOUS

## 2016-07-19 NOTE — Clinical Social Work Maternal (Signed)
CLINICAL SOCIAL WORK MATERNAL/CHILD NOTE  Patient Details  Name: Casey Lang MRN: [id number] Date of Birth: Oct 15, 1980  Date:  07/19/2016  Clinical Social Worker Initiating Note:  Terri Piedra, [place] Date/ Time Initiated:  07/19/16/1500     Child's Name:  Casey Lang   Legal Guardian:  Other (Comment) (Parents: Casey Lang and Casey Lang)   Need for Interpreter:  None   Date of Referral:  07/19/16     Reason for Referral:  Behavioral Health Issues, including SI    Referral Source:  [facility]   Address:  [address] [place], [place] [address]  Phone number:  [phone]   Household Members:  Other (Comment) (fiance)   Natural Supports (not living in the home):  Extended Family, Immediate Family, Parent   Professional Supports: Transport planner, Other (Comment) 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  FOB is a double amputee and receives disability.   Education:      Museum/gallery curator Resources:  [employer]   Other Resources:  [employer], Food Stamps    Cultural/Religious Considerations Which May Impact Care: None stated.  Strengths:  Ability to meet basic needs , Compliance with medical plan , Home prepared for child , Understanding of illness, Pediatrician chosen , Other (Comment) Hospital doctor Pediatrics)   Risk Factors/Current Problems:  Mental Health Concerns  (MOB with dxs of Bipolar, PTSD, Anxiety and Conversion Disorder)   Cognitive State:  Able to Concentrate , Alert , Goal Oriented , Insightful , Linear Thinking    Mood/Affect:  Comfortable , Calm , Euthymic , Interested    CSW Assessment: CSW met with parents and MGM in MOB's first floor room/113 to offer support and complete assessment due to hx of Bipolar, Anxiety, PTSD and Conversion Disorder.  Family was extremely pleasant, receptive to CSW's visit and easy to engage.   MOB reports she  is doing well, although states labor and delivery were very difficult with complications requiring emergency surgery post delivery for a hematoma.  She states she is feeling much better now and thankful she got through what she did.  While CSW was in the room, pediatric staff came in to provide them with a medical update on baby, who had an abnormal EEG this morning.  Parents were very open to processing their thoughts, feelings and understanding of what they had just been told.  It is clear that bonding with baby is evident and that they want whatever is best for baby.  It was mentioned that a transfer to Pediatrics was a possibility and FOB seemed to think he would like to do this in order to be on the safe side while monitoring for baby's medical issues.  CSW encouraged parents to be open with staff regarding their thoughts, questions, and concerns and to be an advocate for their baby.  However, FOB is very concerned that if their baby is admitted to the NICU that they will be too far away at home in [place], approximately 1 hour away.  CSW explained that [facility] may be an option in this case, but that we are not able to get them in to the home on the weekend (it may also be too late to arrange this afternoon as it is late on Friday).  As baby's POC is developing, CSW encouraged them to focus on baby and that we can re-evaluate their needs on Monday.  Parents  were very calm in talking about their baby's medical situation and state trust in the staff caring for him.  FOB, however, seems to be at heightened concern, which CSW feels may be a result of his prior experiences with child loss.  FOB's first child was stillborn and his second child died from allergic reaction to a bee sting.  FOB is interested in learning CPR and states he will make sure he has an Epi pen.  He had appropriate questions regarding SIDS risks and precautions and was attentive to information provided by CSW.  MGM feels strongly that  baby should go home with a monitor.  CSW discussed drawbacks of monitors and the possibility of increased anxiety when a baby goes home on a monitor, but validated her idea and suggests she speak with the doctor about their thoughts on this.  CSW assured family that baby will not be discharged until medical staff feel he is medically ready to do so. MGM appears very involved and supportive.  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  They state they have everything they need for baby at home. MOB was very open about her mental health concerns/hx.  She states had previously received services at [employer] and now receives services at [employer], as Casey Lang is no longer practicing.  MOB reports that she has been in treatment for mental health concerns since she was 50 (now 22) and as FOB described, "she is keenly aware of her triggers and symptoms and will speak up when she is not feeling well."  Both parents and MGM were attentive to education provided by CSW regarding PMADs and the importance of monitoring and reporting symptoms.  MOB states commitment to doing so.  She is currently on medication (Lamictal, Seroquel, Trazodone, Klonopin and Vistaril) and plans to continue.  CSW commends MOB for her openness in discussing her mental health concerns as well as her continued treatment.  She seemed appreciative of this.  CSW provided her with information about support groups held at [facility] and encouraged her to self-evaluate during the postpartum period with a New Mom Checklist from Postpartum Progress.  MOB agreed.   CSW thanked family for speaking so openly with CSW and for their desire to act in baby's best interest.  CSW explained ongoing support as needed and asked them to call CSW if there is anything they feel CSW can do to support  them.  Family was very appreciative of CSW's visit and concern for their emotional wellbeing.  CSW provided contact information.  CSW spent approximately an hour with family.  CSW Plan/Description:  No Further Intervention Required/No Barriers to Discharge, Patient/Family Education , Psychosocial Support and Ongoing Assessment of Needs, Information/Referral to [place], [place], [place] 07/19/2016, 9:10 PM

## 2016-07-19 NOTE — Progress Notes (Signed)
Post Partum and POST OP DAY #1 Subjective: tolerating PO and She feels weak and would like a couple of units of PRBCs  Objective: Blood pressure 139/74, pulse 100, temperature 98.6 F (37 C), temperature source Oral, resp. rate 18, last menstrual period 11/03/2015, SpO2 100 %, unknown if currently breastfeeding.  Physical Exam:  General: alert Lochia: appropriate Uterine Fundus: firm DVT Evaluation: No evidence of DVT seen on physical exam.   Recent Labs  07/18/16 1024 07/18/16 1628  HGB 6.6* 7.8*  HCT 20.6* 22.9*    Assessment/Plan: Plan for discharge tomorrow  2 units of PRBCs today   LOS: 2 days   Alphons Burgert C Neisha Hinger 07/19/2016, 7:10 AM

## 2016-07-19 NOTE — Progress Notes (Signed)
Notified MD regarding BPs.  No new orders. Will continue to monitor

## 2016-07-20 ENCOUNTER — Encounter (HOSPITAL_COMMUNITY): Payer: Self-pay

## 2016-07-20 LAB — TYPE AND SCREEN
ABO/RH(D): O POS
Antibody Screen: NEGATIVE
UNIT DIVISION: 0
UNIT DIVISION: 0
Unit division: 0
Unit division: 0

## 2016-07-20 LAB — CBC
HCT: 23.2 % — ABNORMAL LOW (ref 36.0–46.0)
Hemoglobin: 7.9 g/dL — ABNORMAL LOW (ref 12.0–15.0)
MCH: 28.9 pg (ref 26.0–34.0)
MCHC: 34.1 g/dL (ref 30.0–36.0)
MCV: 85 fL (ref 78.0–100.0)
Platelets: 204 10*3/uL (ref 150–400)
RBC: 2.73 MIL/uL — ABNORMAL LOW (ref 3.87–5.11)
RDW: 15.6 % — AB (ref 11.5–15.5)
WBC: 8.1 10*3/uL (ref 4.0–10.5)

## 2016-07-20 LAB — BPAM RBC
BLOOD PRODUCT EXPIRATION DATE: 201806292359
Blood Product Expiration Date: 201806252359
Blood Product Expiration Date: 201806262359
Blood Product Expiration Date: 201806292359
ISSUE DATE / TIME: 201806081155
ISSUE DATE / TIME: 201806071119
ISSUE DATE / TIME: 201806071119
ISSUE DATE / TIME: 201806080922
UNIT TYPE AND RH: 5100
UNIT TYPE AND RH: 5100
UNIT TYPE AND RH: 5100
Unit Type and Rh: 5100

## 2016-07-20 MED ORDER — LISINOPRIL 20 MG PO TABS
20.0000 mg | ORAL_TABLET | Freq: Every day | ORAL | Status: DC
  Administered 2016-07-20 – 2016-07-21 (×2): 20 mg via ORAL
  Filled 2016-07-20 (×3): qty 1

## 2016-07-20 MED ORDER — ACETAMINOPHEN 325 MG PO TABS: 650.0000 mg | ORAL_TABLET | ORAL | 0 refills | Status: DC | PRN

## 2016-07-20 MED ORDER — LISINOPRIL 20 MG PO TABS: 20.0000 mg | ORAL_TABLET | Freq: Every day | ORAL | 1 refills | Status: DC

## 2016-07-20 MED ORDER — OXYCODONE HCL 5 MG PO TABS: 5.0000 mg | ORAL_TABLET | ORAL | 0 refills | Status: DC | PRN

## 2016-07-20 NOTE — Progress Notes (Addendum)
Post Partum Day 2 Subjective: voiding, tolerating PO and pt has tachycardia and tolerates being upright fairly well sl weakness noted...  Objective: Blood pressure 138/88, pulse 98, temperature 98.3 F (36.8 C), temperature source Oral, resp. rate 20, last menstrual period 11/03/2015, SpO2 98 %, unknown if currently breastfeeding.  Physical Exam:  General: alert, cooperative, appears stated age, fatigued, no distress and morbidly obese Lochia: appropriate Uterine Fundus: firm Incision: healing well, no significant drainage, no dehiscence, no significant erythema DVT Evaluation: No evidence of DVT seen on physical exam.   Recent Labs  07/18/16 1628 07/20/16 0506  HGB 7.8* 7.9*  HCT 22.9* 23.2*    Assessment/Plan: Plan for discharge tomorrow  Will closely observe VS today and repeat hgb tomorrow   LOS: 3 days   Koren Shiver 07/20/2016, 10:33 AM

## 2016-07-21 LAB — CBC
HCT: 23.3 % — ABNORMAL LOW (ref 36.0–46.0)
HEMOGLOBIN: 8 g/dL — AB (ref 12.0–15.0)
MCH: 29.4 pg (ref 26.0–34.0)
MCHC: 34.3 g/dL (ref 30.0–36.0)
MCV: 85.7 fL (ref 78.0–100.0)
Platelets: 209 10*3/uL (ref 150–400)
RBC: 2.72 MIL/uL — ABNORMAL LOW (ref 3.87–5.11)
RDW: 15.5 % (ref 11.5–15.5)
WBC: 5.9 10*3/uL (ref 4.0–10.5)

## 2016-07-21 NOTE — Discharge Instructions (Signed)

## 2016-07-21 NOTE — Discharge Summary (Signed)
OB Discharge Summary     Patient Name: Casey Lang DOB: 1980/11/29 MRN: [id number]  Date of admission: 07/17/2016 Delivering MD: Katherine Basset [facility]   Date of discharge: 07/21/2016  Admitting diagnosis: INDUCTION desires sterilization hematoma evacuation and PPBTL Intrauterine pregnancy: [redacted]w[redacted]d     Secondary diagnosis:  Active Problems:   Gestational hypertension   Encounter for sterilization   Vulval hematoma due to birth trauma  Additional problems:Vulvar Hematoma     Discharge diagnosis: Term Pregnancy Delivered, Gestational Hypertension and Vulvar hematoma                                                                                                Post partum procedures:postpartum tubal ligation and evacuation of vulvar hematoma  Augmentation: Pitocin and Cytotec  Complications: Vulvar hematoma  Hospital course:  Induction of Labor With Vaginal Delivery   36 y.o. yo G2P1011 at [redacted]w[redacted]d was admitted to the hospital 07/17/2016 for induction of labor.  Indication for induction: Gestational hypertension.  Patient had an uncomplicated labor course as follows: Membrane Rupture Time/Date: 7:02 PM ,07/17/2016   Intrapartum Procedures: Episiotomy: None [1]                                         Lacerations:  2nd degree [3]  Patient had delivery of a Viable infant.  Information for the patient's newborn:  Kynzley, [place]son [[id number]]  Delivery Method: Vag-Spont   After Delivery, on 07/18/16,  several hours postpartum from delivery of viable  Infant who has an ever-expanding hematoma causing severe pain and dropping Hgb to 6.6. Patient also desires permanent sterilization. 07/18/2016   Dr Kennon Rounds evacuated the hematoma and then did her BTL with Filshie clips.  Tolerated procedure well and recovered quickly with no further enlargement of hematoma.  Details of delivery can be found in separate delivery note.  Patient had a routine postpartum course, except for the vulvar  hematoma which required evacuation. Patient is discharged home 07/21/16.  Physical exam  Vitals:   07/20/16 1028 07/20/16 1030 07/20/16 1238 07/20/16 1752  BP: (!) 150/100 138/88 (!) 113/58 136/82  Pulse: (!) 107 98 98 87  Resp: 20   18  Temp: 98.3 F (36.8 C)   98.5 F (36.9 C)  TempSrc: Oral   Oral  SpO2: 98%   100%   General: alert, cooperative and no distress Lochia: appropriate Uterine Fundus: firm Incision: Healing well with no significant drainage, No swelling or erethema of vulva DVT Evaluation: No evidence of DVT seen on physical exam. Labs: Lab Results  Component Value Date   WBC 5.9 07/21/2016   HGB 8.0 (L) 07/21/2016   HCT 23.3 (L) 07/21/2016   MCV 85.7 07/21/2016   PLT 209 07/21/2016   CMP Latest Ref Rng & Units 07/17/2016  Glucose 65 - 99 mg/dL 110(H)  BUN 6 - 20 mg/dL 16  Creatinine 0.44 - 1.00 mg/dL 0.83  Sodium 135 - 145 mmol/L 135  Potassium 3.5 - 5.1 mmol/L 4.5  Chloride 101 -  111 mmol/L 106  CO2 22 - 32 mmol/L 22  Calcium 8.9 - 10.3 mg/dL 8.8(L)  Total Protein 6.5 - 8.1 g/dL 6.7  Total Bilirubin 0.3 - 1.2 mg/dL 0.5  Alkaline Phos 38 - 126 U/L 78  AST 15 - 41 U/L 22  ALT 14 - 54 U/L 10(L)    Discharge instruction: per After Visit Summary and "Baby and Me Booklet".  After visit meds:  Allergies as of 07/21/2016      Reactions   Morphine And Related Other (See Comments)   Reaction:  Psychosis       Medication List    STOP taking these medications   aspirin 81 MG chewable tablet   calcium carbonate 500 MG chewable tablet Commonly known as:  TUMS - dosed in mg elemental calcium   folic acid 1 MG tablet Commonly known as:  FOLVITE   labetalol 200 MG tablet Commonly known as:  NORMODYNE   PRENATE PIXIE 10-0.6-0.4-200 MG Caps   terconazole 0.8 % vaginal cream Commonly known as:  TERAZOL 3     TAKE these medications   acetaminophen 325 MG tablet Commonly known as:  TYLENOL Take 2 tablets (650 mg total) by mouth every 4 (four) hours  as needed (for pain scale < 4).   gabapentin 300 MG capsule Commonly known as:  NEURONTIN Take 300 mg by mouth at bedtime.   lamoTRIgine 150 MG tablet Commonly known as:  LAMICTAL Take 2 tablets (300 mg total) by mouth every evening.   lisinopril 20 MG tablet Commonly known as:  PRINIVIL,ZESTRIL Take 1 tablet (20 mg total) by mouth daily.   omega-3 acid ethyl esters 1 g capsule Commonly known as:  LOVAZA Take 1 g by mouth daily.   oxyCODONE 5 MG immediate release tablet Commonly known as:  Oxy IR/ROXICODONE Take 1 tablet (5 mg total) by mouth every 4 (four) hours as needed (pain scale 4-7).   QUEtiapine 300 MG tablet Commonly known as:  SEROQUEL Take 600 mg by mouth at bedtime.   traZODone 100 MG tablet Commonly known as:  DESYREL Take 100 mg by mouth at bedtime.       Diet: routine diet  Activity: Advance as tolerated. Pelvic rest for 6 weeks.   Outpatient follow up:2 weeks Follow up Appt:No future appointments. Follow up Visit:No Follow-up on file.  Postpartum contraception: BTL PP  Newborn Data: Live born female  Birth Weight: 6 lb 12.5 oz (3076 g) APGAR: 9, 9  Baby Feeding: Bottle Disposition:home with mother   07/21/2016 Hansel Feinstein, CNM

## 2016-07-31 ENCOUNTER — Ambulatory Visit: Payer: Medicaid Other

## 2016-07-31 VITALS — BP 129/86 | HR 103

## 2016-07-31 DIAGNOSIS — O132 Gestational [pregnancy-induced] hypertension without significant proteinuria, second trimester: Secondary | ICD-10-CM

## 2016-07-31 NOTE — Progress Notes (Signed)
Pt presents for BP check. Pt delivered 11 days ago.

## 2016-09-11 ENCOUNTER — Ambulatory Visit (INDEPENDENT_AMBULATORY_CARE_PROVIDER_SITE_OTHER): Payer: Medicaid Other | Admitting: Obstetrics & Gynecology

## 2016-09-11 ENCOUNTER — Encounter: Payer: Self-pay | Admitting: Obstetrics & Gynecology

## 2016-09-11 NOTE — Progress Notes (Signed)
Post Partum Exam  Casey Lang is a 36 y.o. G19P1011 female who presents for a postpartum visit. She is 8 weeks postpartum following a spontaneous vaginal delivery. I have fully reviewed the prenatal and intrapartum course. The delivery was at [redacted]w[redacted]d gestational weeks.  Anesthesia: local and epidural. Postpartum course has been unremarkable. Baby's course has been unremarkable. Baby is feeding by [employer] Sensitive. Bleeding no bleeding. Bowel function is normal. Bladder function is normal. Patient is sexually active. Contraception method is BTL.Marland Kitchen Postpartum depression screening:neg She had hematoma evacuated postpartum  The following portions of the patient's history were reviewed and updated as appropriate: allergies, current medications, past family history, past medical history, past social history, past surgical history and problem list.  Review of Systems Pertinent items are noted in HPI.    Objective:  unknown if currently breastfeeding.  General:  alert, cooperative and no distress           Abdomen: soft, non-tender; bowel sounds normal; no masses,  no organomegaly   Vulva:  normal  Vagina: normal vagina                    Assessment:    normal postpartum exam. Pap smear not done at today's visit.   Plan:   1. Contraception: tubal ligation 2. Request pap result from [employer] 3. Follow up as needed.   Woodroe Mode, MD

## 2016-09-11 NOTE — Patient Instructions (Signed)

## 2016-10-09 ENCOUNTER — Encounter: Payer: Self-pay | Admitting: Obstetrics

## 2016-10-09 ENCOUNTER — Ambulatory Visit (INDEPENDENT_AMBULATORY_CARE_PROVIDER_SITE_OTHER): Payer: Medicaid Other | Admitting: Obstetrics

## 2016-10-09 VITALS — BP 125/85 | HR 91 | Ht 68.0 in | Wt 260.0 lb

## 2016-10-09 DIAGNOSIS — Z01419 Encounter for gynecological examination (general) (routine) without abnormal findings: Secondary | ICD-10-CM | POA: Diagnosis not present

## 2016-10-09 DIAGNOSIS — Z0000 Encounter for general adult medical examination without abnormal findings: Secondary | ICD-10-CM | POA: Diagnosis not present

## 2016-10-09 NOTE — Progress Notes (Signed)
Subjective:        Casey Lang is a 36 y.o. female here for a routine exam.  Current complaints: None.    Personal health questionnaire:  Is patient Ashkenazi Jewish, have a family history of breast and/or ovarian cancer: no Is there a family history of uterine cancer diagnosed at age < 31, gastrointestinal cancer, urinary tract cancer, family member who is a Field seismologist syndrome-associated carrier: no Is the patient overweight and hypertensive, family history of diabetes, personal history of gestational diabetes, preeclampsia or PCOS: no Is patient over 16, have PCOS,  family history of premature CHD under age 37, diabetes, smoke, have hypertension or peripheral artery disease:  no At any time, has a partner hit, kicked or otherwise hurt or frightened you?: no Over the past 2 weeks, have you felt down, depressed or hopeless?: no Over the past 2 weeks, have you felt little interest or pleasure in doing things?:no   Gynecologic History Patient's last menstrual period was 09/25/2016. Contraception: tubal ligation Last Pap: several years ago. Results were: normal Last mammogram: n/a. Results were: n/a  Obstetric History OB History  Gravida Para Term Preterm AB Living  2 1 1   1 1   SAB TAB Ectopic Multiple Live Births        0 1    # Outcome Date GA Lbr Len/2nd Weight Sex Delivery Anes PTL Lv  2 Term 07/18/16 [redacted]w[redacted]d 01:28 / 00:50 6 lb 12.5 oz (3.076 kg) M Vag-Spont EPI, Local  LIV  1 AB 2010              Past Medical History:  Diagnosis Date  . Anxiety   . Bipolar 1 disorder ([place])   . Depression   . Hypertension    pregnancy induced hypertension  . Mental disorder   . Right foot injury 10/09/2012   Resolved without complication    Past Surgical History:  Procedure Laterality Date  . FOOT SURGERY    . HEMATOMA EVACUATION N/A 07/18/2016   Procedure: EVACUATION HEMATOMA;  Surgeon: Donnamae Jude, MD;  Location: [place];  Service: Gynecology;  Laterality: N/A;   . LAPAROSCOPIC GASTRIC SLEEVE RESECTION  10/09/2012   Surgery was on 09/30/2011  . TUBAL LIGATION Bilateral 07/18/2016   Procedure: POST PARTUM TUBAL LIGATION;  Surgeon: Donnamae Jude, MD;  Location: [place];  Service: Gynecology;  Laterality: Bilateral;     Current Outpatient Prescriptions:  .  gabapentin (NEURONTIN) 300 MG capsule, Take 300 mg by mouth at bedtime., Disp: , Rfl:  .  lamoTRIgine (LAMICTAL) 150 MG tablet, Take 2 tablets (300 mg total) by mouth every evening., Disp: 60 tablet, Rfl: 0 .  lisinopril (PRINIVIL,ZESTRIL) 20 MG tablet, Take 1 tablet (20 mg total) by mouth daily., Disp: 30 tablet, Rfl: 1 .  QUEtiapine (SEROQUEL) 300 MG tablet, Take 600 mg by mouth at bedtime., Disp: , Rfl:  .  traZODone (DESYREL) 100 MG tablet, Take 100 mg by mouth at bedtime., Disp: , Rfl:  .  acetaminophen (TYLENOL) 325 MG tablet, Take 2 tablets (650 mg total) by mouth every 4 (four) hours as needed (for pain scale < 4). (Patient not taking: Reported on 09/11/2016), Disp: 30 tablet, Rfl: 0 .  omega-3 acid ethyl esters (LOVAZA) 1 g capsule, Take 1 g by mouth daily. , Disp: , Rfl:  .  oxyCODONE (OXY IR/ROXICODONE) 5 MG immediate release tablet, Take 1 tablet (5 mg total) by mouth every 4 (four) hours as needed (pain scale 4-7). (  Patient not taking: Reported on 09/11/2016), Disp: 20 tablet, Rfl: 0 .  traZODone (DESYREL) 100 MG tablet, Take by mouth., Disp: , Rfl:  Allergies  Allergen Reactions  . Morphine And Related Other (See Comments)    Reaction:  Psychosis     Social History  Substance Use Topics  . Smoking status: Never Smoker  . Smokeless tobacco: Never Used  . Alcohol use No    Family History  Problem Relation Age of Onset  . Hypertension Mother   . Diabetes Mother   . Diabetes Father   . Hypertension Father   . Anxiety disorder Other   . Depression Other   . Aneurysm Maternal Grandmother   . Alzheimer's disease Paternal Grandmother   . Cancer Paternal Grandmother   .  Seizures Neg Hx       Review of Systems  Constitutional: negative for fatigue and weight loss Respiratory: negative for cough and wheezing Cardiovascular: negative for chest pain, fatigue and palpitations Gastrointestinal: negative for abdominal pain and change in bowel habits Musculoskeletal:negative for myalgias Neurological: negative for gait problems and tremors Behavioral/Psych: negative for abusive relationship, depression Endocrine: negative for temperature intolerance    Genitourinary:negative for abnormal menstrual periods, genital lesions, hot flashes, sexual problems and vaginal discharge Integument/breast: negative for breast lump, breast tenderness, nipple discharge and skin lesion(s)    Objective:       BP 125/85   Pulse 91   Ht 5\' 8"  (1.727 m)   Wt 260 lb (117.9 kg)   LMP 09/25/2016   BMI 39.53 kg/m  General:   alert  Skin:   no rash or abnormalities  Lungs:   clear to auscultation bilaterally  Heart:   regular rate and rhythm, S1, S2 normal, no murmur, click, rub or gallop  Breasts:   normal without suspicious masses, skin or nipple changes or axillary nodes  Abdomen:  normal findings: no organomegaly, soft, non-tender and no hernia  Pelvis:  External genitalia: normal general appearance Urinary system: urethral meatus normal and bladder without fullness, nontender Vaginal: normal without tenderness, induration or masses Cervix: normal appearance Adnexa: normal bimanual exam Uterus: anteverted and non-tender, normal size   Lab Review Urine pregnancy test Labs reviewed yes Radiologic studies reviewed no  50% of 20 min visit spent on counseling and coordination of care.    Assessment and Plan:     1. Encounter for routine gynecological examination with Papanicolaou smear of cervix Rx - Cytology - PAP     Plan:    Education reviewed: calcium supplements, depression evaluation, low fat, low cholesterol diet, safe sex/STD prevention, self breast  exams and weight bearing exercise. Follow up in: 1 year.   Meds ordered this encounter  Medications  . traZODone (DESYREL) 100 MG tablet    Sig: Take by mouth.   No orders of the defined types were placed in this encounter.

## 2016-10-11 LAB — CYTOLOGY - PAP: HPV (WINDOPATH): NOT DETECTED

## 2016-10-17 ENCOUNTER — Encounter: Payer: Self-pay | Admitting: *Deleted

## 2016-10-31 ENCOUNTER — Other Ambulatory Visit (HOSPITAL_COMMUNITY)
Admission: RE | Admit: 2016-10-31 | Discharge: 2016-10-31 | Disposition: A | Discharge status: Home / Self Care | Payer: Medicaid Other | Source: Ambulatory Visit | Attending: Obstetrics | Admitting: Obstetrics

## 2016-10-31 ENCOUNTER — Encounter: Payer: Self-pay | Admitting: *Deleted

## 2016-10-31 ENCOUNTER — Ambulatory Visit (INDEPENDENT_AMBULATORY_CARE_PROVIDER_SITE_OTHER): Payer: Medicaid Other | Admitting: Obstetrics

## 2016-10-31 ENCOUNTER — Encounter: Payer: Self-pay | Admitting: Obstetrics

## 2016-10-31 VITALS — BP 117/83 | HR 87 | Ht 68.0 in | Wt 262.8 lb

## 2016-10-31 DIAGNOSIS — R87612 Low grade squamous intraepithelial lesion on cytologic smear of cervix (LGSIL): Secondary | ICD-10-CM | POA: Diagnosis not present

## 2016-10-31 DIAGNOSIS — N898 Other specified noninflammatory disorders of vagina: Secondary | ICD-10-CM

## 2016-10-31 NOTE — Progress Notes (Signed)
    GYNECOLOGY CLINIC COLPOSCOPY PROCEDURE NOTE  36 y.o. [id number] here for colposcopy for low-grade squamous intraepithelial neoplasia (LGSIL - encompassing HPV,mild dysplasia,CIN I) pap smear with negative High Risk HPV on 10-09-16. Discussed role for HPV in cervical dysplasia, need for surveillance.  Patient given informed consent, signed copy in the chart, time out was performed.  Placed in lithotomy position. Cervix viewed with speculum and colposcope after application of acetic acid.   Colposcopy adequate? Yes  no visible lesions, no mosaicism, no punctation and no abnormal vasculature; corresponding biopsies obtained at 6 and 12 o'clock .  ECC specimen obtained. All specimens were labeled and sent to pathology.   Patient was given post procedure instructions.  Will follow up pathology and manage accordingly; patient will be contacted with results and recommendations.  Routine preventative health maintenance measures emphasized.    Shelly Bombard, MD, [place] Attending [place], [facility] for [employer], [place]

## 2016-10-31 NOTE — Progress Notes (Signed)
Patient is in the office for colpo procedure.

## 2016-11-01 LAB — CERVICOVAGINAL ANCILLARY ONLY
Bacterial vaginitis: NEGATIVE
CANDIDA VAGINITIS: NEGATIVE
TRICH (WINDOWPATH): NEGATIVE

## 2016-11-14 ENCOUNTER — Ambulatory Visit: Payer: Self-pay | Admitting: Obstetrics

## 2016-11-14 ENCOUNTER — Telehealth: Payer: Self-pay

## 2016-11-14 NOTE — Telephone Encounter (Signed)
S/w patient and she stated that provider called her to go over results.

## 2017-09-29 ENCOUNTER — Ambulatory Visit (INDEPENDENT_AMBULATORY_CARE_PROVIDER_SITE_OTHER): Payer: Commercial Managed Care - PPO | Admitting: Podiatry

## 2017-09-29 ENCOUNTER — Other Ambulatory Visit: Payer: Self-pay

## 2017-09-29 ENCOUNTER — Ambulatory Visit (INDEPENDENT_AMBULATORY_CARE_PROVIDER_SITE_OTHER): Payer: Commercial Managed Care - PPO

## 2017-09-29 ENCOUNTER — Encounter: Payer: Self-pay | Admitting: Podiatry

## 2017-09-29 VITALS — BP 130/65 | HR 80 | Resp 15 | Ht 68.0 in | Wt 255.0 lb

## 2017-09-29 DIAGNOSIS — M21612 Bunion of left foot: Secondary | ICD-10-CM

## 2017-09-29 DIAGNOSIS — Q6622 Congenital metatarsus adductus: Secondary | ICD-10-CM

## 2017-09-29 DIAGNOSIS — M2140 Flat foot [pes planus] (acquired), unspecified foot: Secondary | ICD-10-CM

## 2017-09-29 DIAGNOSIS — M76829 Posterior tibial tendinitis, unspecified leg: Secondary | ICD-10-CM

## 2017-09-29 DIAGNOSIS — M2012 Hallux valgus (acquired), left foot: Secondary | ICD-10-CM

## 2017-09-29 DIAGNOSIS — M21611 Bunion of right foot: Secondary | ICD-10-CM

## 2017-09-29 DIAGNOSIS — Q6689 Other  specified congenital deformities of feet: Secondary | ICD-10-CM

## 2017-09-29 DIAGNOSIS — M2011 Hallux valgus (acquired), right foot: Secondary | ICD-10-CM

## 2017-09-29 DIAGNOSIS — M21071 Valgus deformity, not elsewhere classified, right ankle: Secondary | ICD-10-CM

## 2017-09-29 DIAGNOSIS — Q66229 Congenital metatarsus adductus, unspecified foot: Secondary | ICD-10-CM

## 2017-09-29 NOTE — Progress Notes (Signed)
   Subjective:    Patient ID: Casey Lang, female    DOB: [birth year], 37 y.o.   MRN: [id number]  HPI    Review of Systems  Neurological: Positive for seizures.  Psychiatric/Behavioral: Positive for agitation, behavioral problems and dysphoric mood. The patient is nervous/anxious.   All other systems reviewed and are negative.      Objective:   Physical Exam        Assessment & Plan:

## 2017-09-30 NOTE — Progress Notes (Signed)
  Subjective:  Patient ID: Casey Lang, female    DOB: 1980-05-21,  MRN: [id number]  Chief Complaint  Patient presents with  . Foot Pain    R medial foot pain x years; 10/10 sharp pain -no injury Tx: custome orthotics, and ankle brace -pt is flat footed    37 y.o. female presents with the above complaint. Reports history of flat feet. Has tried custom orthotics. Uses a brace she purchased over the counter. Has history of bunion correction to both feet - no pain at the area at present. Has pain when she is at work. Works as a Scientist, research (physical sciences).  Review of Systems: Negative except as noted in the HPI. Denies N/V/F/Ch.  Past Medical History:  Diagnosis Date  . Anxiety   . Bipolar 1 disorder ([place])   . Depression   . Hypertension    pregnancy induced hypertension  . Mental disorder   . Right foot injury 10/09/2012   Resolved without complication    Current Outpatient Medications:  .  clonazePAM (KLONOPIN) 0.5 MG tablet, Take 0.5 mg by mouth daily as needed., Disp: , Rfl: 0 .  gabapentin (NEURONTIN) 300 MG capsule, Take 300 mg by mouth at bedtime., Disp: , Rfl:  .  lamoTRIgine (LAMICTAL) 150 MG tablet, Take 2 tablets (300 mg total) by mouth every evening., Disp: 60 tablet, Rfl: 0 .  QUEtiapine (SEROQUEL) 300 MG tablet, Take 600 mg by mouth at bedtime., Disp: , Rfl:   Social History   Tobacco Use  Smoking Status Never Smoker  Smokeless Tobacco Never Used    Allergies  Allergen Reactions  . Morphine And Related Other (See Comments)    Reaction:  Psychosis    Objective:   Vitals:   09/29/17 1125  BP: 130/65  Pulse: 80  Resp: 15   Body mass index is 38.77 kg/m. Constitutional Well developed. Well nourished.  Vascular Dorsalis pedis pulses palpable bilaterally. Posterior tibial pulses palpable bilaterally. Capillary refill normal to all digits.  No cyanosis or clubbing noted. Pedal hair growth normal.  Neurologic Normal speech. Oriented to person, place, and  time. Epicritic sensation to light touch grossly present bilaterally.  Dermatologic Nails well groomed and normal in appearance. No open wounds. No skin lesions.  Orthopedic: Normal joint ROM without pain or crepitus bilaterally. Pes planus with ankle valgus R>L. Prominent navicular R No bony tenderness. Forefoot varus R.   Radiographs: Taken and reviewed. Severe ankle valgus. Degenerative changes noted. Evidence of prior bunionectomy bilat. Metatarsus adductus present. Atavistic navicular. Assessment:   1. Pes planus, unspecified laterality   2. PTTD (posterior tibial tendon dysfunction)   3. Metatarsus adductus   4. Acquired valgus deformity of right ankle   5. Hallux abductovalgus with bunions, left   6. Hallux abductovalgus with bunions, right   7. Forefoot varus    Plan:  Patient was evaluated and treated and all questions answered.  Severe Pes Planus with Ankle Valgus, R>L -XR reviewed with patient. -Discussed with patient treatment options for the deformities she has. Discussed that she would likely need staged surgery - first to address hindfoot deformity, then later to address forefoot deformity. Would require significant investment of patient's time to be non-weightbearing. Discussed holding off surgical intervention at this time - patient agreed. -Would benefit from rigid brace to RLE with CMO to the LLE. Discussed rationale with patient. Will make appointment with orthotist for evaluation and casting.  F/u after device pickup.  -  No follow-ups on file.

## 2017-10-01 ENCOUNTER — Other Ambulatory Visit: Payer: Self-pay | Admitting: Podiatry

## 2017-10-01 DIAGNOSIS — M2140 Flat foot [pes planus] (acquired), unspecified foot: Secondary | ICD-10-CM

## 2017-10-01 DIAGNOSIS — Q66229 Congenital metatarsus adductus, unspecified foot: Secondary | ICD-10-CM

## 2017-10-01 DIAGNOSIS — M2012 Hallux valgus (acquired), left foot: Secondary | ICD-10-CM

## 2017-10-01 DIAGNOSIS — M2011 Hallux valgus (acquired), right foot: Secondary | ICD-10-CM

## 2017-10-01 DIAGNOSIS — M76829 Posterior tibial tendinitis, unspecified leg: Secondary | ICD-10-CM

## 2017-10-01 DIAGNOSIS — M21612 Bunion of left foot: Secondary | ICD-10-CM

## 2017-10-01 DIAGNOSIS — M21071 Valgus deformity, not elsewhere classified, right ankle: Secondary | ICD-10-CM

## 2017-10-01 DIAGNOSIS — Q6689 Other  specified congenital deformities of feet: Secondary | ICD-10-CM

## 2017-10-01 DIAGNOSIS — M21611 Bunion of right foot: Secondary | ICD-10-CM

## 2017-10-16 ENCOUNTER — Ambulatory Visit (INDEPENDENT_AMBULATORY_CARE_PROVIDER_SITE_OTHER): Payer: Commercial Managed Care - PPO | Admitting: *Deleted

## 2017-10-16 DIAGNOSIS — Q6689 Other  specified congenital deformities of feet: Secondary | ICD-10-CM

## 2017-10-16 DIAGNOSIS — M2140 Flat foot [pes planus] (acquired), unspecified foot: Secondary | ICD-10-CM

## 2017-10-16 DIAGNOSIS — M76829 Posterior tibial tendinitis, unspecified leg: Secondary | ICD-10-CM

## 2017-11-13 ENCOUNTER — Ambulatory Visit (INDEPENDENT_AMBULATORY_CARE_PROVIDER_SITE_OTHER): Payer: Commercial Managed Care - PPO | Admitting: *Deleted

## 2017-11-13 DIAGNOSIS — M2140 Flat foot [pes planus] (acquired), unspecified foot: Secondary | ICD-10-CM

## 2017-11-13 DIAGNOSIS — Q66229 Congenital metatarsus adductus, unspecified foot: Secondary | ICD-10-CM

## 2017-11-13 DIAGNOSIS — M76829 Posterior tibial tendinitis, unspecified leg: Secondary | ICD-10-CM

## 2017-11-13 DIAGNOSIS — Q6689 Other  specified congenital deformities of feet: Secondary | ICD-10-CM | POA: Diagnosis not present

## 2017-12-11 NOTE — Progress Notes (Signed)
Patient ID: Casey Lang, female   DOB: December 09, 1980, 37 y.o.   MRN: [id number]  Patient presents for fitting of [place] with [facility] Certified Pedorthist. Written and verbal break in instructions given. Patient will follow up in 6 weeks with Dr. March Rummage or sooner if symptoms worsen or fail to improve.

## 2017-12-18 IMAGING — US US MFM OB DETAIL+14 WK
2 series · 14 of 28 positions shown · non-contrast
Comparison: none

[Series 1: us mfm ob detail+14 wk · 12 of 96 slices shown (1 of 2)]
[im 5/96]
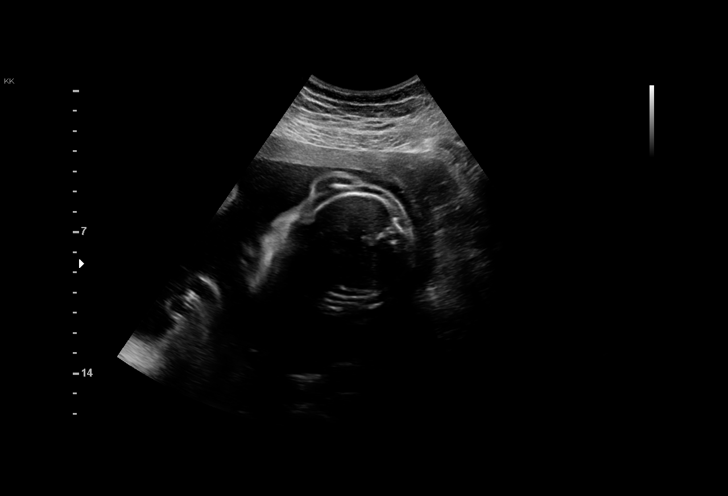
[im 13/96]
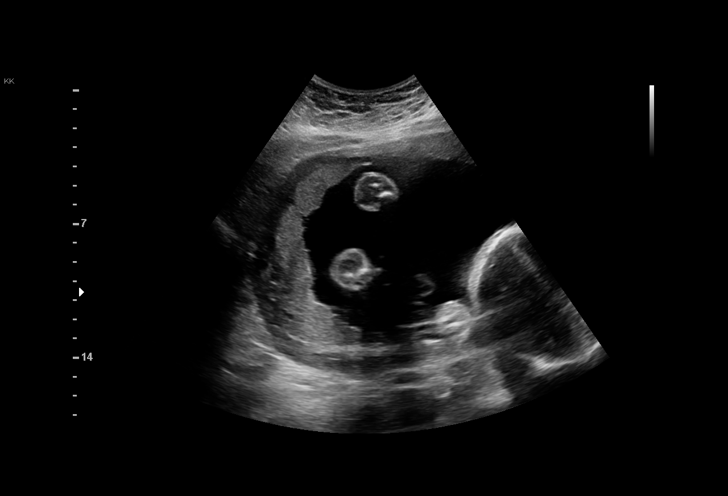
[im 21/96]
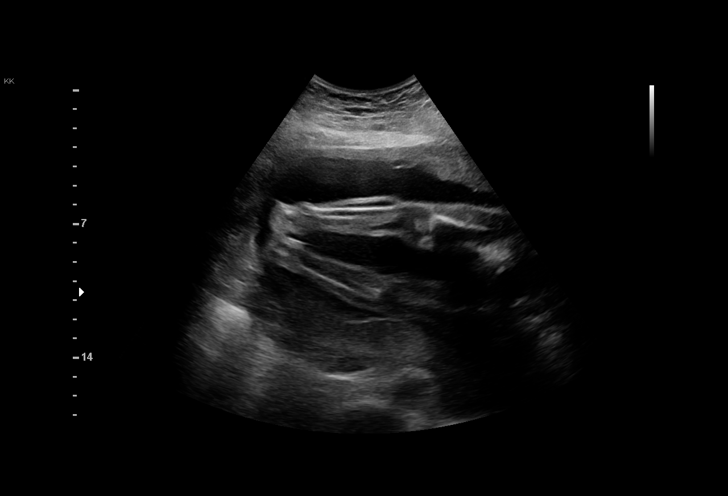
[im 29/96]
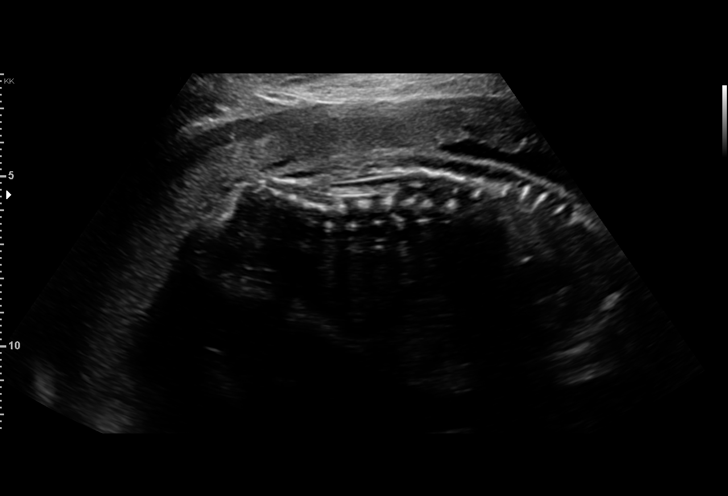
[im 38/96]
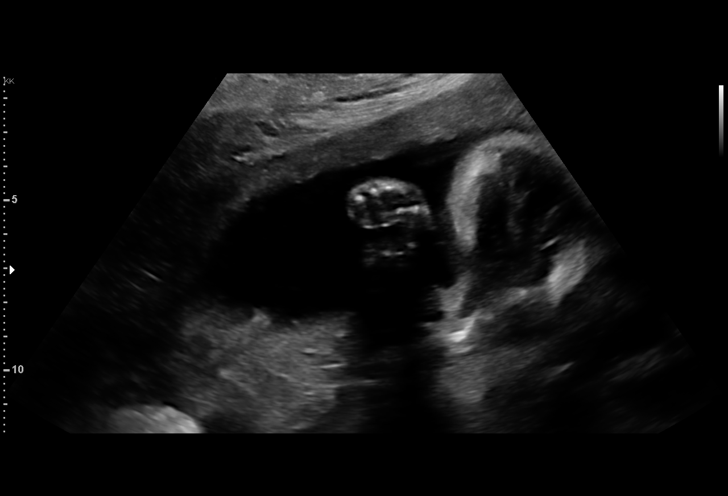
[im 46/96]
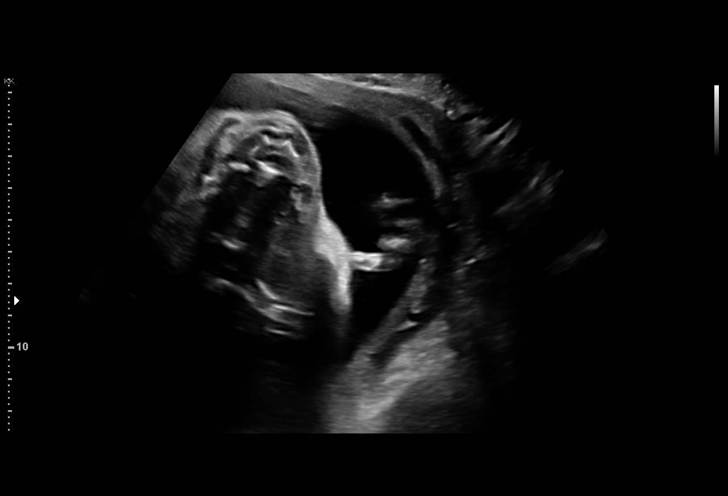
[im 54/96]
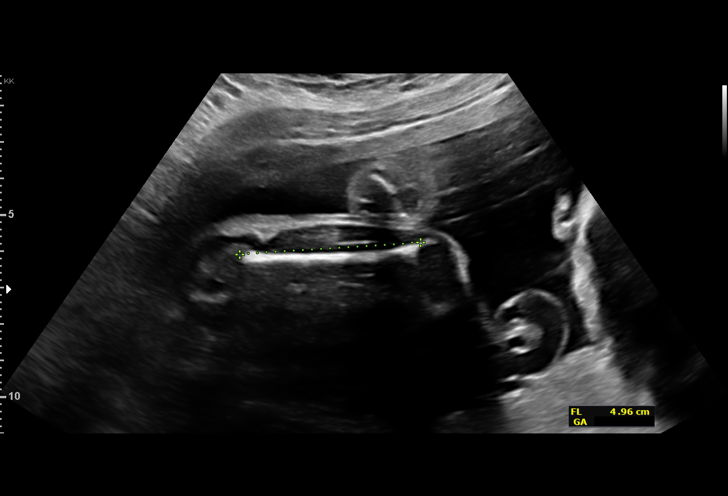
[im 62/96]
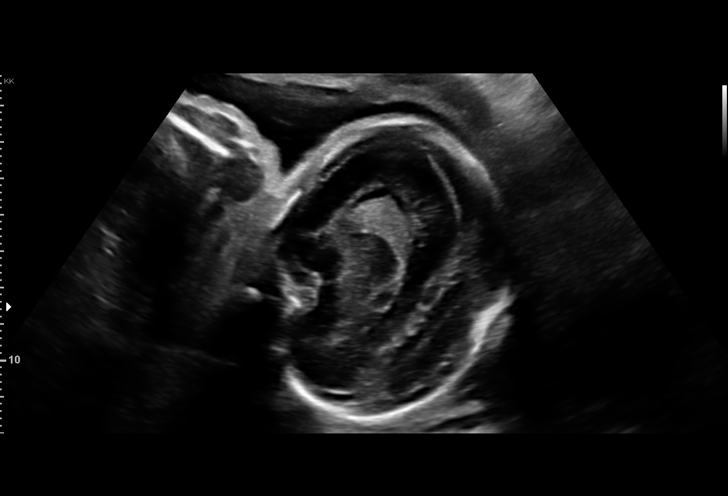
[im 71/96]
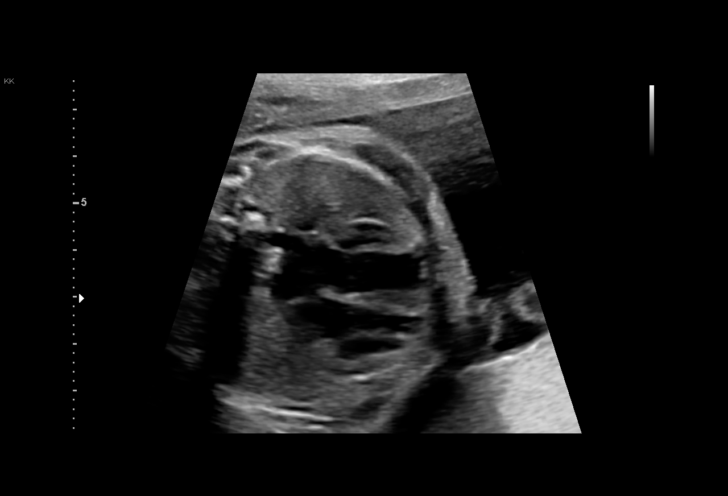
[im 79/96]
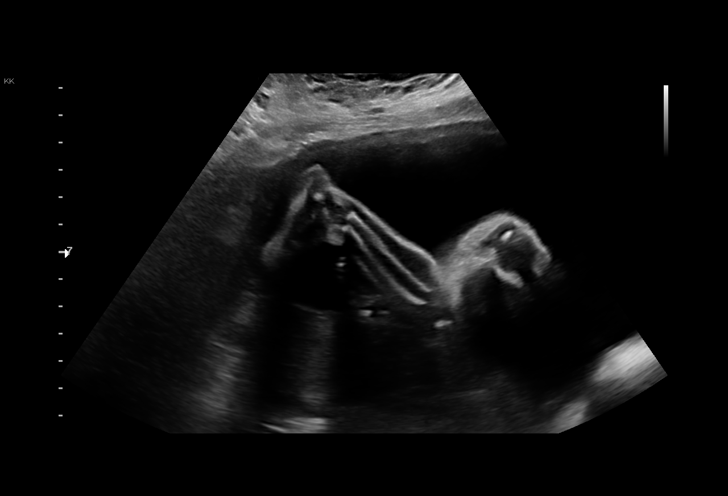
[im 87/96]
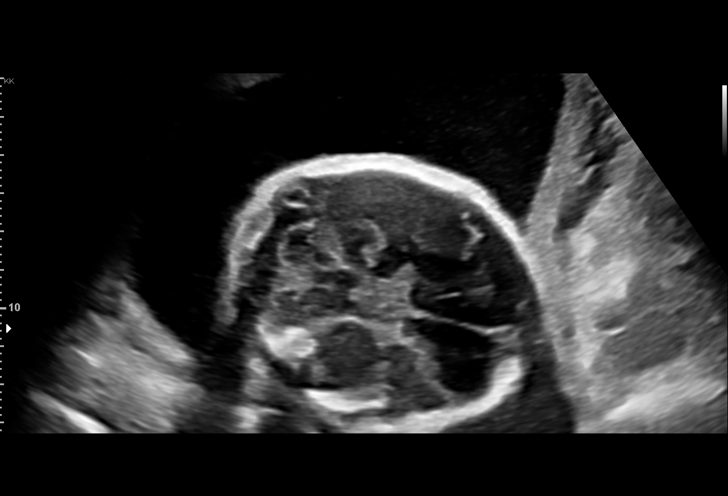
[im 96/96]
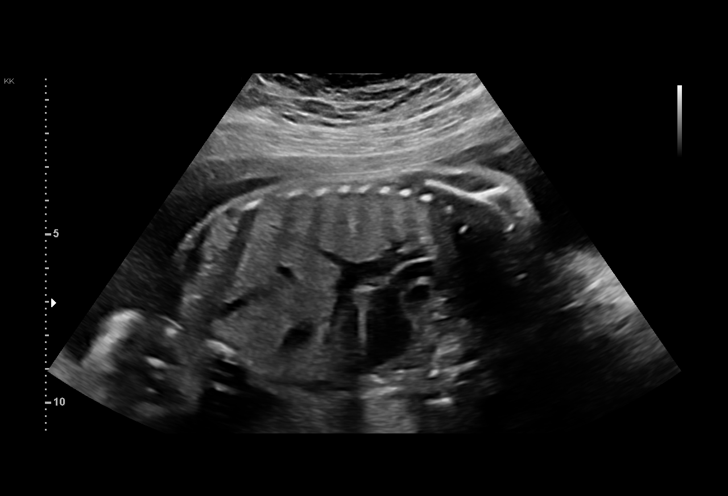

[Series 3: us mfm ob detail+14 wk · 2 of 16 slices shown (2 of 2)]
[im 6/16]
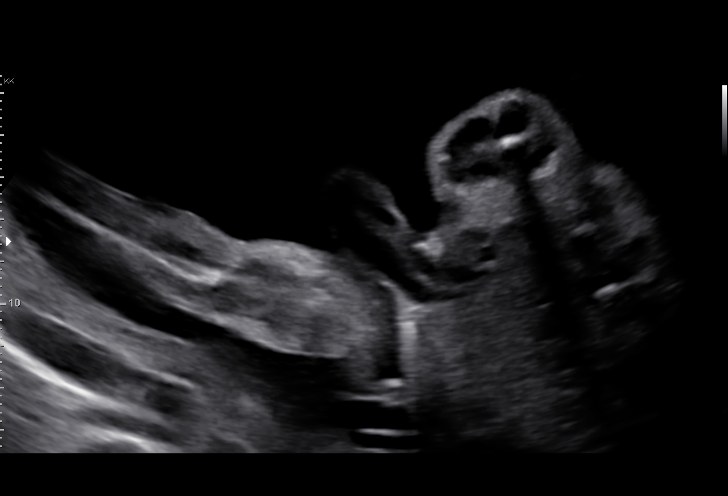
[im 16/16]
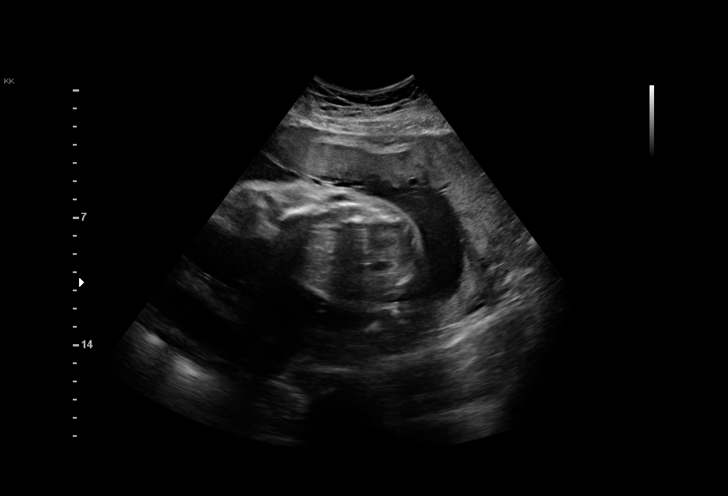

[14 of 28 positions shown; findings below may reference images not displayed]

Road [HOSPITAL]

Indications

26 weeks gestation of pregnancy
Encounter for fetal anatomic survey
Advanced maternal age multigravida 35+,
second trimester
Bipolar disease during pregnancy
Pseudoseizures
Obesity complicating pregnancy, second
trimester
OB History

Blood Type:            Height:  5'8"   Weight (lb):  270       BMI:
Gravidity:    2
TOP:          1
Fetal Evaluation

Num Of Fetuses:     1
Fetal Heart         146
Rate(bpm):
Cardiac Activity:   Observed
Presentation:       Cephalic
Placenta:           Posterior, above cervical os
P. Cord Insertion:  Visualized

Amniotic Fluid
AFI FV:      Subjectively within normal limits

Largest Pocket(cm)
6.0
Biometry

BPD:      68.3  mm     G. Age:  27w 3d         86  %    CI:          76.5  %    70 - 86
FL/HC:       19.7  %    18.6 -
HC:      247.4  mm     G. Age:  26w 6d         55  %    HC/AC:       0.99       1.04 -
AC:      249.6  mm     G. Age:  29w 1d       > 97  %    FL/BPD:      71.4  %    71 - 87
FL:       48.8  mm     G. Age:  26w 3d         49  %    FL/AC:       19.6  %    20 - 24
HUM:      45.6  mm     G. Age:  27w 0d         68  %

Est. FW:    0011   gm     2 lb 8 oz     81  %
Gestational Age

LMP:           26w 0d        Date:  11/03/15                 EDD:    08/09/16
U/S Today:     27w 3d                                        EDD:    07/30/16
Best:          26w 0d     Det. By:  LMP  (11/03/15)          EDD:    08/09/16
Anatomy

Cranium:               Appears normal         Aortic Arch:            Appears normal
Cavum:                 Appears normal         Ductal Arch:            Appears normal
Ventricles:            Appears normal         Diaphragm:              Appears normal
Choroid Plexus:        Appears normal         Stomach:                Appears normal, left
sided
Cerebellum:            Appears normal         Abdomen:                Appears normal
Posterior Fossa:       Appears normal         Abdominal Wall:         Not well visualized
Nuchal Fold:           Not applicable (>20    Cord Vessels:           Appears normal (3
wks GA)                                        vessel cord)
Face:                  Not well visualized    Kidneys:                Appear normal
Lips:                  Not well visualized    Bladder:                Appears normal
Thoracic:              Appears normal         Spine:                  Appears normal
Heart:                 Appears normal         Upper Extremities:      Appears normal
(4CH, axis, and situs
RVOT:                  Appears normal         Lower Extremities:      Appears normal
LVOT:                  Appears normal

Other:  Male gender. Technically difficult due to fetal position.
Cervix Uterus Adnexa

Cervix
Length:              5  cm.
Normal appearance by transabdominal scan.
Myomas

Site                     L(cm)      W(cm)      D(cm)       Location
LUS                      7          5.2        6.3         Anterior

Blood Flow                 RI        PI       Comments

Impression

SIUP at 26+0 weeks
Normal detailed fetal anatomy; limited views of the face and
CI
Normal amniotic fluid volume
Measurements consistent with LMP dating; EFW at the 81st
%tile
Fibroid uterus: see above for size and location
Recommendations

Follow-up ultrasound in4 weeks to complete anatomy survey
and to reassess fetal growth

## 2017-12-22 ENCOUNTER — Ambulatory Visit: Payer: Commercial Managed Care - PPO | Admitting: Podiatry

## 2017-12-25 NOTE — Progress Notes (Signed)
Patient ID: Casey Lang, female   DOB: [birth year], 37 y.o.   MRN: [id number]   Patient presents at Dr. Eleanora Neighbor request to be casted for a custom molded brace with [facility] Certified Pedorthist.  Patient will return in 4 weeks to be fitted.

## 2017-12-29 ENCOUNTER — Ambulatory Visit: Payer: Commercial Managed Care - PPO | Admitting: Podiatry

## 2017-12-29 DIAGNOSIS — Q66229 Congenital metatarsus adductus, unspecified foot: Secondary | ICD-10-CM

## 2017-12-29 DIAGNOSIS — M2041 Other hammer toe(s) (acquired), right foot: Secondary | ICD-10-CM

## 2017-12-29 DIAGNOSIS — M76829 Posterior tibial tendinitis, unspecified leg: Secondary | ICD-10-CM | POA: Diagnosis not present

## 2017-12-29 DIAGNOSIS — M21611 Bunion of right foot: Secondary | ICD-10-CM

## 2017-12-29 DIAGNOSIS — Q6689 Other  specified congenital deformities of feet: Secondary | ICD-10-CM

## 2017-12-29 DIAGNOSIS — M2140 Flat foot [pes planus] (acquired), unspecified foot: Secondary | ICD-10-CM

## 2017-12-29 DIAGNOSIS — M21071 Valgus deformity, not elsewhere classified, right ankle: Secondary | ICD-10-CM

## 2017-12-29 DIAGNOSIS — M2011 Hallux valgus (acquired), right foot: Secondary | ICD-10-CM

## 2017-12-29 NOTE — Patient Instructions (Signed)
Pre-Operative Instructions  Congratulations, you have decided to take an important step towards improving your quality of life.  You can be assured that the doctors and staff at [facility] will be with you every step of the way.  Here are some important things you should know:  1. Plan to be at the surgery center/hospital at least 1 (one) hour prior to your scheduled time, unless otherwise directed by the surgical center/hospital staff.  You must have a responsible adult accompany you, remain during the surgery and drive you home.  Make sure you have directions to the surgical center/hospital to ensure you arrive on time. 2. If you are having surgery at [facility] or [facility], you will need a copy of your medical history and physical form from your family physician within one month prior to the date of surgery. We will give you a form for your primary physician to complete.  3. We make every effort to accommodate the date you request for surgery.  However, there are times where surgery dates or times have to be moved.  We will contact you as soon as possible if a change in schedule is required.   4. No aspirin/ibuprofen for one week before surgery.  If you are on aspirin, any non-steroidal anti-inflammatory medications (Mobic, Aleve, Ibuprofen) should not be taken seven (7) days prior to your surgery.  You make take Tylenol for pain prior to surgery.  5. Medications - If you are taking daily heart and blood pressure medications, seizure, reflux, allergy, asthma, anxiety, pain or diabetes medications, make sure you notify the surgery center/hospital before the day of surgery so they can tell you which medications you should take or avoid the day of surgery. 6. No food or drink after midnight the night before surgery unless directed otherwise by surgical center/hospital staff. 7. No alcoholic beverages 24-hours prior to surgery.  No smoking 24-hours prior or 24-hours after  surgery. 8. Wear loose pants or shorts. They should be loose enough to fit over bandages, boots, and casts. 9. Don't wear slip-on shoes. Sneakers are preferred. 10. Bring your boot with you to the surgery center/hospital.  Also bring crutches or a walker if your physician has prescribed it for you.  If you do not have this equipment, it will be provided for you after surgery. 11. If you have not been contacted by the surgery center/hospital by the day before your surgery, call to confirm the date and time of your surgery. 12. Leave-time from work may vary depending on the type of surgery you have.  Appropriate arrangements should be made prior to surgery with your employer. 13. Prescriptions will be provided immediately following surgery by your doctor.  Fill these as soon as possible after surgery and take the medication as directed. Pain medications will not be refilled on weekends and must be approved by the doctor. 14. Remove nail polish on the operative foot and avoid getting pedicures prior to surgery. 15. Wash the night before surgery.  The night before surgery wash the foot and leg well with water and the antibacterial soap provided. Be sure to pay special attention to beneath the toenails and in between the toes.  Wash for at least three (3) minutes. Rinse thoroughly with water and dry well with a towel.  Perform this wash unless told not to do so by your physician.  Enclosed: 1 Ice pack (please put in freezer the night before surgery)   1 Hibiclens skin cleaner     Pre-op instructions  If you have any questions regarding the instructions, please do not hesitate to call our office.  [place]: [address], [place], [place] [address] -- [phone]  [place]: [address] [place], [place] [address] -- [phone]  [place]: [address]  [place], [place] [address] -- [phone]  [place]: [address], [place], [place] [address] -- [phone]  Website:  [email or web address] 

## 2017-12-30 ENCOUNTER — Telehealth: Payer: Self-pay | Admitting: *Deleted

## 2017-12-30 NOTE — Telephone Encounter (Signed)
"  I'm calling to schedule my surgery."  Who is your doctor?  "It's Casey Lang."  When did you last see him?  "I saw him yesterday."  I do not have your information at this time.  I will get it tomorrow.  Can I call you back then?  "Yes, that will be fine.  (Please send her information.)

## 2018-01-01 NOTE — Telephone Encounter (Signed)
I attempted to return her call.  I left her a message to call me back. 

## 2018-01-02 NOTE — Telephone Encounter (Signed)
I attempted to call the patient again.  I left her a message to call me back. °

## 2018-01-04 NOTE — Progress Notes (Signed)
Subjective:  Patient ID: Casey Lang, female    DOB: 1980-07-30,  MRN: [id number]  Chief Complaint  Patient presents with  . pes planus    F/U BL pes planus and R ankle brace Pt. states," the brace helps out w/ thepain and the mold skin padding takes off some of the pain as well." Tx: mold skin padding, ankle brace, toe seperatore and toe taping    37 y.o. female presents with the above complaint.  History as above.  Review of Systems: Negative except as noted in the HPI. Denies N/V/F/Ch.  Past Medical History:  Diagnosis Date  . Anxiety   . Bipolar 1 disorder ([place])   . Depression   . Hypertension    pregnancy induced hypertension  . Mental disorder   . Right foot injury 10/09/2012   Resolved without complication    Current Outpatient Medications:  .  clonazePAM (KLONOPIN) 0.5 MG tablet, Take 0.5 mg by mouth daily as needed., Disp: , Rfl: 0 .  gabapentin (NEURONTIN) 300 MG capsule, Take 300 mg by mouth at bedtime., Disp: , Rfl:  .  lamoTRIgine (LAMICTAL) 150 MG tablet, Take 2 tablets (300 mg total) by mouth every evening., Disp: 60 tablet, Rfl: 0 .  QUEtiapine (SEROQUEL) 300 MG tablet, Take 600 mg by mouth at bedtime., Disp: , Rfl:   Social History   Tobacco Use  Smoking Status Never Smoker  Smokeless Tobacco Never Used    Allergies  Allergen Reactions  . Morphine And Related Other (See Comments)    Reaction:  Psychosis    Objective:   There were no vitals filed for this visit. There is no height or weight on file to calculate BMI. Constitutional Well developed. Well nourished.  Vascular Dorsalis pedis pulses palpable bilaterally. Posterior tibial pulses palpable bilaterally. Capillary refill normal to all digits.  No cyanosis or clubbing noted. Pedal hair growth normal.  Neurologic Normal speech. Oriented to person, place, and time. Epicritic sensation to light touch grossly present bilaterally.  Dermatologic Nails well groomed and normal in  appearance. No open wounds. No skin lesions.  Orthopedic: Normal joint ROM without pain or crepitus bilaterally. Pes planus with ankle valgus R>L. Prominent navicular R No bony tenderness. Forefoot varus R.   Radiographs: 8/19: Severe ankle valgus. Degenerative changes noted. Evidence of prior bunionectomy bilat. Metatarsus adductus present. Atavistic navicular. Assessment:   1. PTTD (posterior tibial tendon dysfunction)   2. Pes planus, unspecified laterality   3. Forefoot varus   4. Metatarsus adductus   5. Acquired valgus deformity of right ankle   6. Hammer toe of right foot   7. Hallux valgus with bunions of right foot    Plan:  Patient was evaluated and treated and all questions answered.  Severe Pes Planus with Ankle Valgus, R>L -Pleased with results of the right states that it is definitely helping with the pain has to pad on the inside aspect of the brace to prevent some pain and irritation. -Discussed follow-up with orthotist for possible padding of the brace.  Hallux abductovalgus right with second hammertoe -Discussed possible conservative versus surgical therapy for this issue. -Discussed with patient that due to the hindfoot being a deforming force it is possible for this deformity to recur.  Patient verbalized understanding -Patient has failed all conservative therapy and wishes to proceed with surgical intervention. All risks, benefits, and alternatives discussed with patient. No guarantees given. Consent reviewed and signed by patient. -Planned procedures: Revision right foot bunion with cutting shifting of bone,  correction of right second hammertoe with shortening of the toe, shortening osteotomy right second metatarsal.  25 minutes of face to face time were spent with the patient. >50% of this was spent on counseling and coordination of care. Specifically discussed with patient the above diagnoses and overall treatment plan.   Return for post-op care.

## 2018-01-05 NOTE — Telephone Encounter (Signed)
"  I'm calling to schedule my surgery for March 18, 2018.  I can't remember my doctor's name."  Was it Dr. March Rummage?  "Yes, I saw Dr. March Rummage."  I'll get it scheduled.  Someone from the surgical center will give you a call a day or two prior to your surgery date and they will give you your arrival time.  You need to register with the surgical center online via their [facility] [employer] portal.  "How do I do that?"  Instructions are in the brochure that we gave you.  "I'll take care of it."

## 2018-02-04 IMAGING — US US MFM FETAL BPP W/O NON-STRESS
1 series · 15 of 22 positions shown · non-contrast
Comparison: none

[Series 1: us mfm fetal bpp w/o non-stress · 22 acquisitions, 15 frames shown]
[im 1/22]
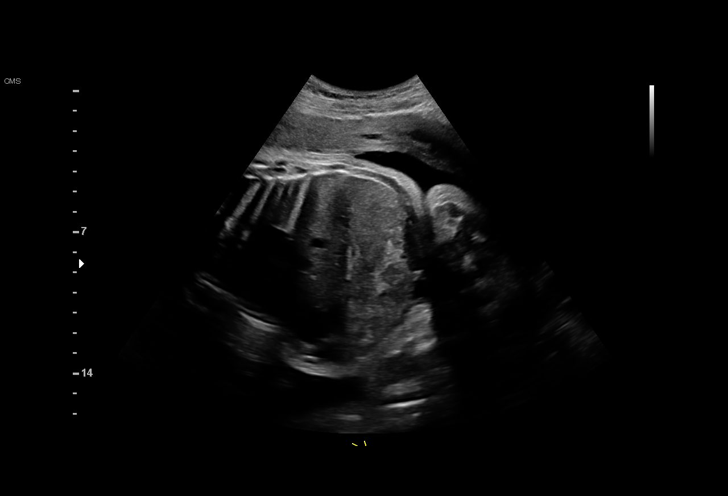
[im 3/22]
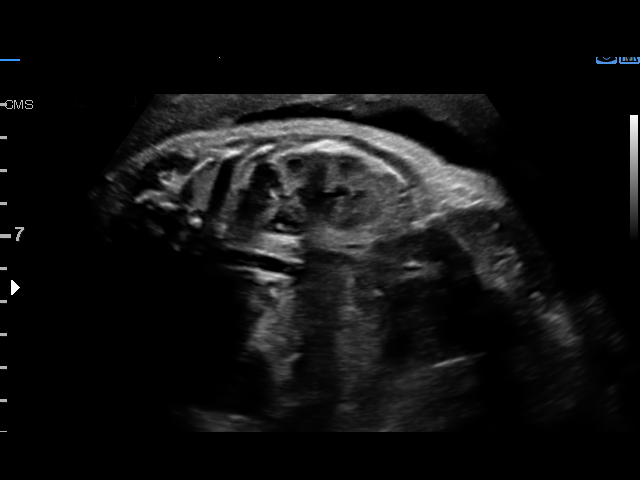
[im 4/22]
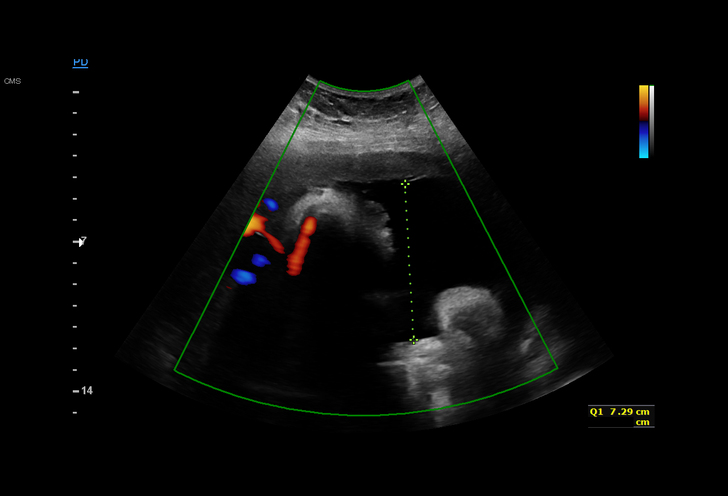
[im 6/22]
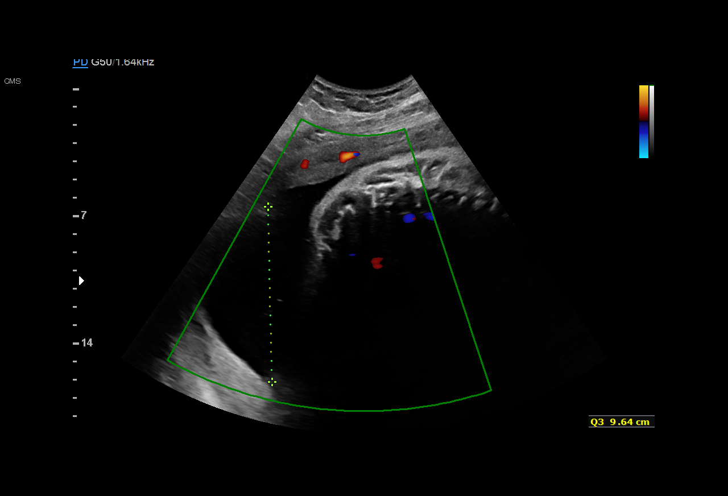
[im 7/22]
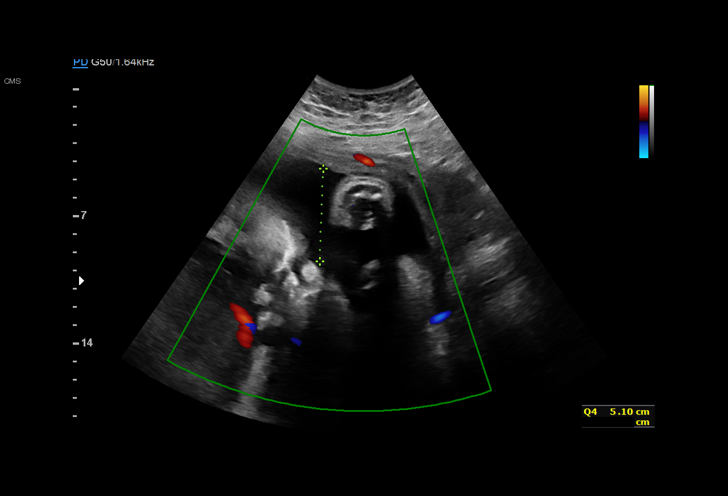
[im 9/22]
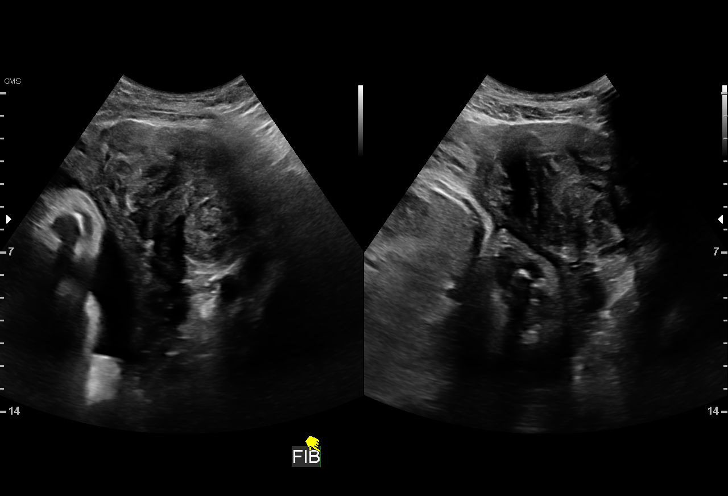
[im 10/22]
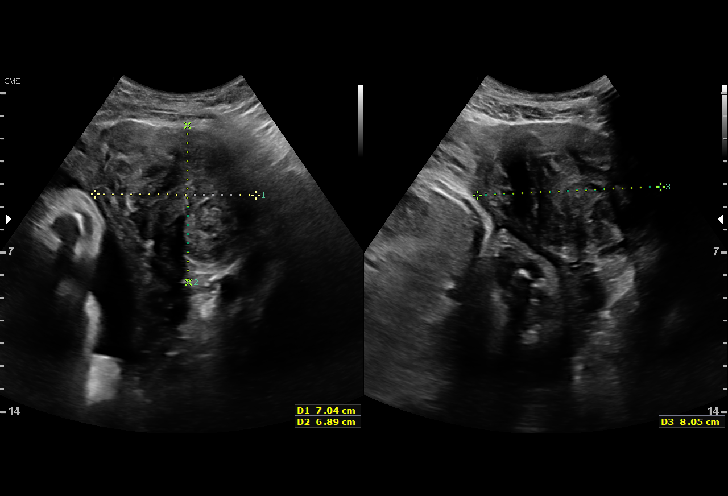
[im 12/22]
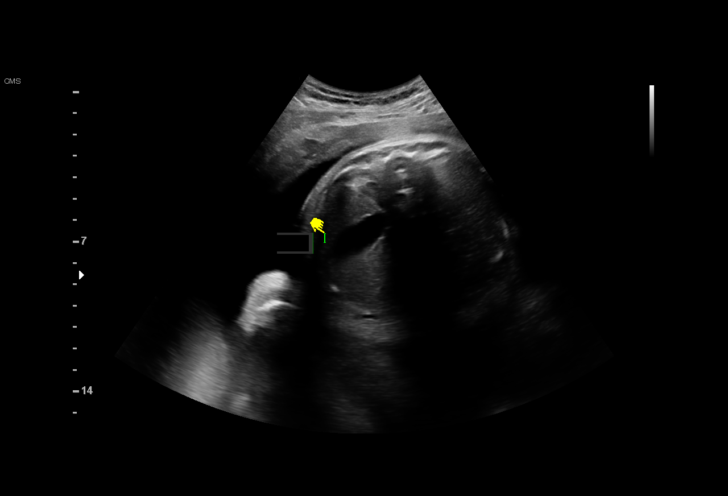
[im 13/22]
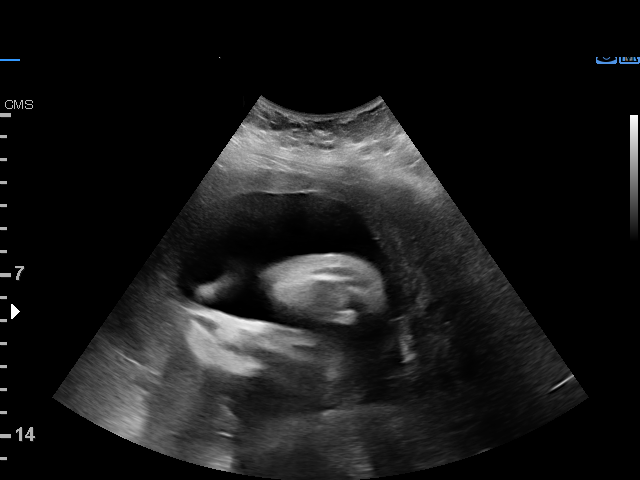
[im 14/22]
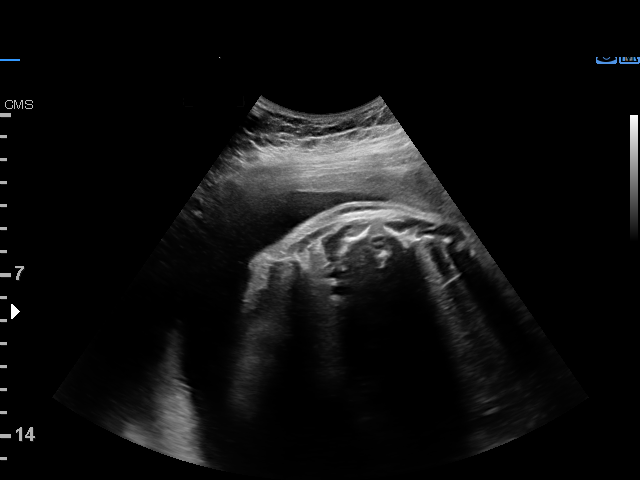
[im 16/22]
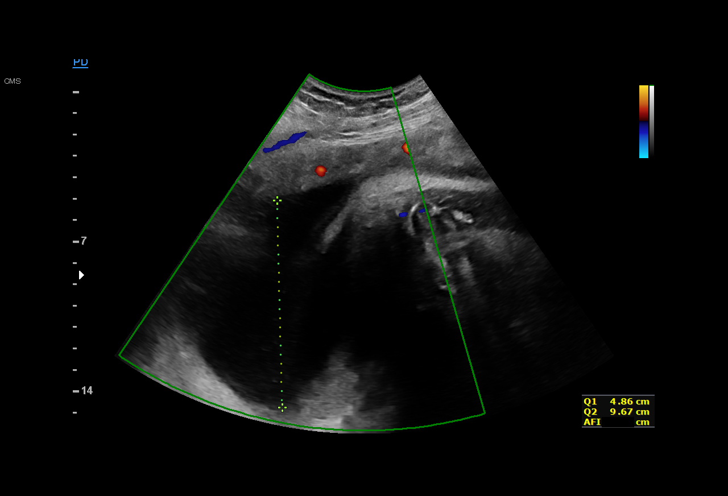
[im 17/22]
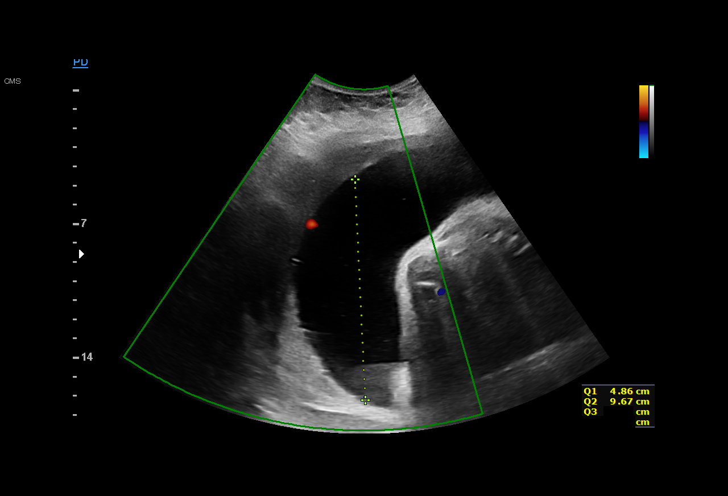
[im 19/22]
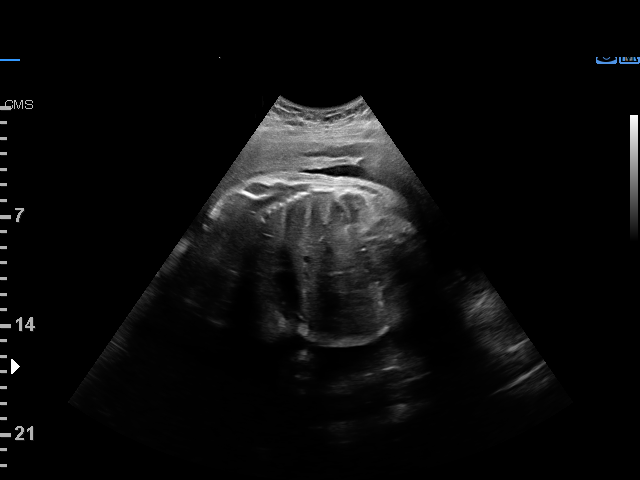
[im 20/22]
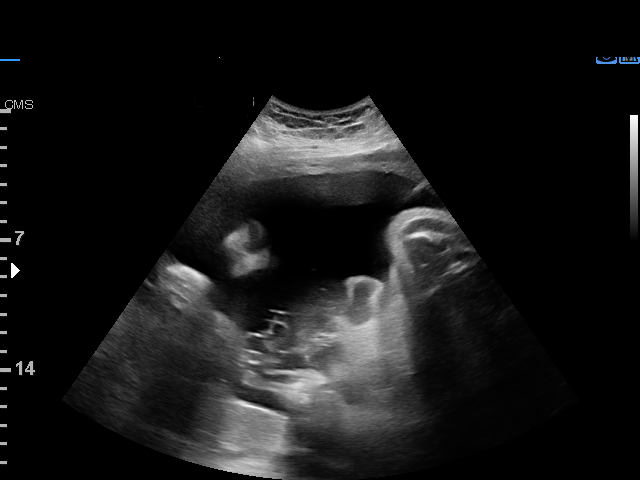
[im 22/22]
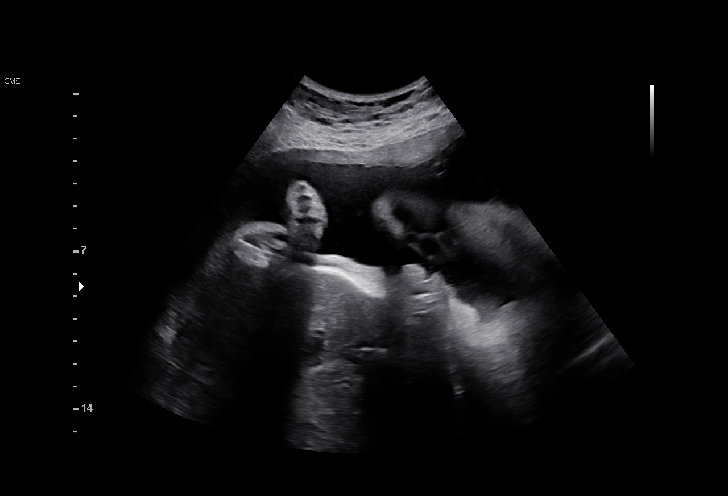

[15 of 22 positions shown; findings below may reference images not displayed]

Road [HOSPITAL]

1  NDEF BERENDS            [id number]      [id number]     [id number]
Indications

33 weeks gestation of pregnancy
Advanced maternal age multigravida 35+,
third trimester (low risk NIPS)
Bipolar disease during pregnancy
Obesity complicating pregnancy, third
trimester
Hypertension - Gestational (labetalol) vs
chronic
Polyhydramnios, third trimester, antepartum
condition or complication, unspecified fetus
OB History

Blood Type:            Height:  5'8"   Weight (lb):  270      BMI:
Gravidity:    2
TOP:          1
Fetal Evaluation

Num Of Fetuses:     1
Cardiac Activity:   Observed
Presentation:       Breech

Amniotic Fluid
AFI FV:      Polyhydramnios

AFI Sum(cm)     %Tile       Largest Pocket(cm)
33.46           > 97

RUQ(cm)       RLQ(cm)       LUQ(cm)        LLQ(cm)
6.07
Biophysical Evaluation

Amniotic F.V:   Polyhydramnios             F. Tone:        Observed
F. Movement:    Observed                   Score:          [DATE]
F. Breathing:   Observed
Gestational Age

LMP:           32w 6d       Date:   11/03/15                 EDD:   08/09/16
Best:          33w 1d    Det. By:   Early Ultrasound         EDD:   08/07/16
(12/29/15)
Impression

Single IUP at 33w 1d
Increasing polyhydramnios; normal fetal stomach (AFI
cm)
BPP [DATE]
Recommendations

Continue antenatal testing and serial ultrasounds for growth

## 2018-02-18 ENCOUNTER — Encounter: Payer: Self-pay | Admitting: Sports Medicine

## 2018-02-18 ENCOUNTER — Ambulatory Visit: Payer: Commercial Managed Care - PPO | Admitting: Sports Medicine

## 2018-02-18 DIAGNOSIS — M76829 Posterior tibial tendinitis, unspecified leg: Secondary | ICD-10-CM | POA: Diagnosis not present

## 2018-02-18 DIAGNOSIS — Q6689 Other  specified congenital deformities of feet: Secondary | ICD-10-CM | POA: Diagnosis not present

## 2018-02-18 DIAGNOSIS — Q66229 Congenital metatarsus adductus, unspecified foot: Secondary | ICD-10-CM | POA: Diagnosis not present

## 2018-02-18 DIAGNOSIS — M21071 Valgus deformity, not elsewhere classified, right ankle: Secondary | ICD-10-CM

## 2018-02-18 DIAGNOSIS — M2140 Flat foot [pes planus] (acquired), unspecified foot: Secondary | ICD-10-CM | POA: Diagnosis not present

## 2018-02-18 DIAGNOSIS — M2041 Other hammer toe(s) (acquired), right foot: Secondary | ICD-10-CM

## 2018-02-18 NOTE — Progress Notes (Signed)
Subjective: Casey Lang is a 38 y.o. female patient who presents to office for evaluation of Right foot pain. Patient reports that she is scheduled for surgery with Dr. March Rummage to fix her bunion and hammertoe but now her flat feet hurts her more and the brace is rubbing and causing a lot of pain patient states the pain is so bad it is difficult to tolerate and reports that she would like her flat feet and her arch pain to be addressed as well.  Patient states that she would like to discuss any tips on how to calm down the pain.  Review of Systems  Musculoskeletal: Positive for joint pain.  All other systems reviewed and are negative.    Patient Active Problem List   Diagnosis Date Noted  . Gestational hypertension 07/17/2016  . Gestational hypertension, third trimester 05/03/2016  . 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 Bipolar I disorder, most recent episode (or current) manic ([place]) 03/22/2015  . PTSD (post-traumatic stress disorder)   . GAD (generalized anxiety disorder)   . Hyperprolactinemia ([place]) 03/02/2015  . Bipolar disorder, curr episode mixed, severe, with psychotic features ([place]) 02/28/2015   Current Outpatient Medications on File Prior to Visit  Medication Sig Dispense Refill  . clonazePAM (KLONOPIN) 0.5 MG tablet Take 0.5 mg by mouth daily as needed.  0  . gabapentin (NEURONTIN) 300 MG capsule Take 300 mg by mouth at bedtime.    . lamoTRIgine (LAMICTAL) 150 MG tablet Take 2 tablets (300 mg total) by mouth every evening. 60 tablet 0  . QUEtiapine (SEROQUEL) 300 MG tablet Take 600 mg by mouth at bedtime.     No current facility-administered medications on file prior to visit.    Allergies  Allergen Reactions  . Morphine And Related Other (See Comments)    Reaction:  Psychosis      Objective:  General: Alert and oriented x3 in no acute distress  Dermatology: Callus at the fibular plantar medial aspect of the right  foot, no open lesions bilateral lower extremities, no webspace macerations, no ecchymosis bilateral, all nails x 10 are well manicured.  Vascular: Dorsalis Pedis and Posterior Tibial pedal pulses 2/4, Capillary Fill Time 3 seconds, (+) pedal hair growth bilateral, no edema bilateral lower extremities, Temperature gradient within normal limits.  Neurology: Johney Maine sensation intact via light touch bilateral..   Musculoskeletal: Mild tenderness with palpation along medial arch with increased pain along the posterior tibial tendon course and at the navicular at the area of callus on the right foot, there is significant rear foot valgus and ankle valgus involved with metatarsus adductus right greater than left and significant bunion and hammertoe deformities.  Assessment and Plan: Problem List Items Addressed This Visit    None    Visit Diagnoses    PTTD (posterior tibial tendon dysfunction)    -  Primary   Pes planus, unspecified laterality       Forefoot varus       Metatarsus adductus       Acquired valgus deformity of right ankle       Hammer toe of right foot          -Complete examination performed -Discussed with patient is conservative treatment until she has a further discussion with Dr. March Rummage about making some additions to her surgical plan.  Patient reports that she is also concerned about her FMLA and thinks that she has 12 weeks and reports that she would like to  consider having both her flatfoot issue addressed as well as the bunion and hammertoes addressed at the same time with 1 complete surgery.  Advised patient to further discuss this with Dr. March Rummage as well as to further discuss with her job her FMLA and disability benefits to use for her postoperative recovery. -Meanwhile I offered patient a steroid injection however she declined -I recommended patient to use topical pain or Lidoderm patch to the area and to continue with brace offloading padding and good supportive shoes -At no  charge mechanically trimmed callus skin and applied salinocaine -I also advised patient that if she has a cam boot and feels like her brace is no longer helping and wants to try the cam boot she definitely can do so until time for her follow-up appointment with Dr. March Rummage and until time for her surgical procedure -Patient to return to office as scheduled with Dr. March Rummage or sooner if condition worsens.  Landis Martins, DPM

## 2018-02-24 ENCOUNTER — Ambulatory Visit: Payer: Commercial Managed Care - PPO | Admitting: Podiatry

## 2018-03-03 ENCOUNTER — Telehealth: Payer: Self-pay | Admitting: *Deleted

## 2018-03-09 NOTE — Telephone Encounter (Signed)
"  Mal Misty Dommer canceled. Does not want to reschedule."  [facility]ia[facility], Business Office  [address]  [place], [place] [address] [phone] Jenetta [phone]   I informed Dr. March Rummage of Cynthia's message and canceled the surgery in [employer].

## 2018-03-09 NOTE — Telephone Encounter (Signed)
Noted thanks °

## 2018-03-24 ENCOUNTER — Encounter: Payer: Commercial Managed Care - PPO | Admitting: Podiatry

## 2018-03-31 ENCOUNTER — Encounter: Payer: Commercial Managed Care - PPO | Admitting: Podiatry

## 2018-05-14 ENCOUNTER — Telehealth: Payer: Self-pay | Admitting: *Deleted

## 2018-05-14 NOTE — Telephone Encounter (Signed)
"  I was scheduled for surgery before I had to cancel it.  Are you all still doing surgeries?  I didn't know if you were at all due to the [place].  I'm looking at around June or July."  Yes, we are still doing surgeries.  Would you like to schedule a date for June or July.  "No, I'll call back and schedule it.  I'm waiting on my taxes so I can pay off a few things first.  I'll call back later to schedule it."

## 2018-06-01 ENCOUNTER — Ambulatory Visit (INDEPENDENT_AMBULATORY_CARE_PROVIDER_SITE_OTHER): Payer: Commercial Managed Care - PPO

## 2018-06-01 ENCOUNTER — Ambulatory Visit: Payer: Commercial Managed Care - PPO | Admitting: Podiatry

## 2018-06-01 ENCOUNTER — Encounter: Payer: Self-pay | Admitting: Podiatry

## 2018-06-01 ENCOUNTER — Other Ambulatory Visit: Payer: Self-pay

## 2018-06-01 ENCOUNTER — Telehealth: Payer: Self-pay | Admitting: *Deleted

## 2018-06-01 VITALS — Temp 97.8°F | Resp 16

## 2018-06-01 DIAGNOSIS — M2011 Hallux valgus (acquired), right foot: Secondary | ICD-10-CM

## 2018-06-01 DIAGNOSIS — Q66229 Congenital metatarsus adductus, unspecified foot: Secondary | ICD-10-CM

## 2018-06-01 DIAGNOSIS — M2140 Flat foot [pes planus] (acquired), unspecified foot: Secondary | ICD-10-CM

## 2018-06-01 DIAGNOSIS — Q6689 Other  specified congenital deformities of feet: Secondary | ICD-10-CM

## 2018-06-01 DIAGNOSIS — M2041 Other hammer toe(s) (acquired), right foot: Secondary | ICD-10-CM

## 2018-06-01 DIAGNOSIS — M76829 Posterior tibial tendinitis, unspecified leg: Secondary | ICD-10-CM | POA: Diagnosis not present

## 2018-06-01 DIAGNOSIS — M21611 Bunion of right foot: Secondary | ICD-10-CM

## 2018-06-01 DIAGNOSIS — M21071 Valgus deformity, not elsewhere classified, right ankle: Secondary | ICD-10-CM

## 2018-06-01 NOTE — Patient Instructions (Signed)
Pre-Operative Instructions  Congratulations, you have decided to take an important step towards improving your quality of life.  You can be assured that the doctors and staff at [facility] will be with you every step of the way.  Here are some important things you should know:  1. Plan to be at the surgery center/hospital at least 1 (one) hour prior to your scheduled time, unless otherwise directed by the surgical center/hospital staff.  You must have a responsible adult accompany you, remain during the surgery and drive you home.  Make sure you have directions to the surgical center/hospital to ensure you arrive on time. 2. If you are having surgery at [facility] or [facility], you will need a copy of your medical history and physical form from your family physician within one month prior to the date of surgery. We will give you a form for your primary physician to complete.  3. We make every effort to accommodate the date you request for surgery.  However, there are times where surgery dates or times have to be moved.  We will contact you as soon as possible if a change in schedule is required.   4. No aspirin/ibuprofen for one week before surgery.  If you are on aspirin, any non-steroidal anti-inflammatory medications (Mobic, Aleve, Ibuprofen) should not be taken seven (7) days prior to your surgery.  You make take Tylenol for pain prior to surgery.  5. Medications - If you are taking daily heart and blood pressure medications, seizure, reflux, allergy, asthma, anxiety, pain or diabetes medications, make sure you notify the surgery center/hospital before the day of surgery so they can tell you which medications you should take or avoid the day of surgery. 6. No food or drink after midnight the night before surgery unless directed otherwise by surgical center/hospital staff. 7. No alcoholic beverages 24-hours prior to surgery.  No smoking 24-hours prior or 24-hours after  surgery. 8. Wear loose pants or shorts. They should be loose enough to fit over bandages, boots, and casts. 9. Don't wear slip-on shoes. Sneakers are preferred. 10. Bring your boot with you to the surgery center/hospital.  Also bring crutches or a walker if your physician has prescribed it for you.  If you do not have this equipment, it will be provided for you after surgery. 11. If you have not been contacted by the surgery center/hospital by the day before your surgery, call to confirm the date and time of your surgery. 12. Leave-time from work may vary depending on the type of surgery you have.  Appropriate arrangements should be made prior to surgery with your employer. 13. Prescriptions will be provided immediately following surgery by your doctor.  Fill these as soon as possible after surgery and take the medication as directed. Pain medications will not be refilled on weekends and must be approved by the doctor. 14. Remove nail polish on the operative foot and avoid getting pedicures prior to surgery. 15. Wash the night before surgery.  The night before surgery wash the foot and leg well with water and the antibacterial soap provided. Be sure to pay special attention to beneath the toenails and in between the toes.  Wash for at least three (3) minutes. Rinse thoroughly with water and dry well with a towel.  Perform this wash unless told not to do so by your physician.  Enclosed: 1 Ice pack (please put in freezer the night before surgery)   1 Hibiclens skin cleaner     Pre-op instructions  If you have any questions regarding the instructions, please do not hesitate to call our office.  [place]: [address], [place], [place] [address] -- [phone]  [place]: [address] [place], [place] [address] -- [phone]  [place]: [address]  [place], [place] [address] -- [phone]  [place]: [address], [place], [place] [address] -- [phone]  Website:  [email or web address] 

## 2018-06-01 NOTE — Telephone Encounter (Signed)
Orders to J. Quintana, RN for pre-cert. 

## 2018-06-01 NOTE — Telephone Encounter (Signed)
-----   Message from Evelina Bucy, DPM sent at 06/01/2018  9:06 AM EDT ----- Can we order MRI ankle right foot: dx: PTTD Pre-op planning.

## 2018-06-01 NOTE — Progress Notes (Signed)
  Subjective:  Patient ID: Casey Lang, female    DOB: Aug 27, 1980,  MRN: [id number]  Chief Complaint  Patient presents with  . PTTD    F/U Rt foot and ankle pain Pt.s tates," w/o a shoe pain is worse. On average pain is a 7/10 (intermittetn shooting pain)." Tx: good supp shoes -pt denies N/V/F/Ch     38 y.o. female presents with the above complaint.  History as above.  Review of Systems: Negative except as noted in the HPI. Denies N/V/F/Ch.  Past Medical History:  Diagnosis Date  . Anxiety   . Bipolar 1 disorder ([place])   . Depression   . Hypertension    pregnancy induced hypertension  . Mental disorder   . Right foot injury 10/09/2012   Resolved without complication    Current Outpatient Medications:  .  clonazePAM (KLONOPIN) 0.5 MG tablet, Take 0.5 mg by mouth daily as needed., Disp: , Rfl: 0 .  gabapentin (NEURONTIN) 300 MG capsule, Take 300 mg by mouth at bedtime., Disp: , Rfl:  .  lamoTRIgine (LAMICTAL) 150 MG tablet, Take 2 tablets (300 mg total) by mouth every evening., Disp: 60 tablet, Rfl: 0 .  QUEtiapine (SEROQUEL) 300 MG tablet, Take 600 mg by mouth at bedtime., Disp: , Rfl:   Social History   Tobacco Use  Smoking Status Never Smoker  Smokeless Tobacco Never Used    Allergies  Allergen Reactions  . Morphine And Related Other (See Comments)    Reaction:  Psychosis    Objective:   Vitals:   06/01/18 0837  Resp: 16  Temp: 97.8 F (36.6 C)   There is no height or weight on file to calculate BMI. Constitutional Well developed. Well nourished.  Vascular Dorsalis pedis pulses palpable bilaterally. Posterior tibial pulses palpable bilaterally. Capillary refill normal to all digits.  No cyanosis or clubbing noted. Pedal hair growth normal.  Neurologic Normal speech. Oriented to person, place, and time. Epicritic sensation to light touch grossly present bilaterally.  Dermatologic Nails well groomed and normal in appearance. No open wounds. No skin  lesions.  Orthopedic: Normal joint ROM without pain or crepitus bilaterally. Pes planus with ankle valgus R>L. Prominent navicular R No bony tenderness. Forefoot varus R.   Radiographs: 8/19: Severe ankle valgus. Degenerative changes noted. Evidence of prior bunionectomy bilat. Metatarsus adductus present. Atavistic navicular. Assessment:   1. PTTD (posterior tibial tendon dysfunction)   2. Pes planus, unspecified laterality   3. Metatarsus adductus   4. Forefoot varus   5. Acquired valgus deformity of right ankle   6. Hammer toe of right foot   7. Hallux valgus with bunions of right foot    Plan:  Patient was evaluated and treated and all questions answered.  Severe Pes Planus with hindfoot Valgus, R>L, Hallux valgus -Would like to have both of them ankle fixed same time.  Discussed the patient that this is possible however during my work this may need to be done in a staged procedure with one stage II procedure for procedure.  X-rays taken reviewed today include severe hindfoot valgus and forefoot abduction.  Also with elongated second metatarsal and hallux valgus deformity.  We will plan for surgical correction of both procedures however will get MRI to evaluate posterior tibial tendon integrity and cartilaginous integrity of the hindfoot.  -Will f/u in 3 weeks for review of MRI.  Return in about 3 weeks (around 06/22/2018) for MRI F/u .

## 2018-06-05 NOTE — Telephone Encounter (Signed)
I infoarmed pt of [employer] [phone] and appt.

## 2018-06-05 NOTE — Telephone Encounter (Signed)
[facility] Jacob Moores scheduled MRI [id number] 06/09/2018 arrive 11:45am for 12:00pm imaging. Faxed Orders to [facility].

## 2018-06-05 NOTE — Telephone Encounter (Signed)
Unable to leave a message with [facility] appt time, answering machine message continued to repeat without allowing to leave a message.

## 2018-06-10 ENCOUNTER — Encounter: Payer: Self-pay | Admitting: Podiatry

## 2018-06-22 ENCOUNTER — Ambulatory Visit: Payer: Commercial Managed Care - PPO | Admitting: Podiatry

## 2018-06-29 ENCOUNTER — Encounter: Payer: Self-pay | Admitting: Podiatry

## 2018-06-29 ENCOUNTER — Ambulatory Visit (INDEPENDENT_AMBULATORY_CARE_PROVIDER_SITE_OTHER): Payer: Commercial Managed Care - PPO | Admitting: Podiatry

## 2018-06-29 ENCOUNTER — Other Ambulatory Visit: Payer: Self-pay

## 2018-06-29 VITALS — Temp 98.3°F | Resp 16

## 2018-06-29 DIAGNOSIS — Q66229 Congenital metatarsus adductus, unspecified foot: Secondary | ICD-10-CM | POA: Diagnosis not present

## 2018-06-29 DIAGNOSIS — Q6689 Other  specified congenital deformities of feet: Secondary | ICD-10-CM

## 2018-06-29 DIAGNOSIS — M2011 Hallux valgus (acquired), right foot: Secondary | ICD-10-CM

## 2018-06-29 DIAGNOSIS — M2141 Flat foot [pes planus] (acquired), right foot: Secondary | ICD-10-CM | POA: Diagnosis not present

## 2018-06-29 DIAGNOSIS — M76829 Posterior tibial tendinitis, unspecified leg: Secondary | ICD-10-CM | POA: Diagnosis not present

## 2018-06-29 DIAGNOSIS — Z01818 Encounter for other preprocedural examination: Secondary | ICD-10-CM | POA: Diagnosis not present

## 2018-06-29 DIAGNOSIS — M2041 Other hammer toe(s) (acquired), right foot: Secondary | ICD-10-CM

## 2018-06-29 DIAGNOSIS — M21071 Valgus deformity, not elsewhere classified, right ankle: Secondary | ICD-10-CM

## 2018-06-29 DIAGNOSIS — M21611 Bunion of right foot: Secondary | ICD-10-CM

## 2018-06-29 NOTE — Progress Notes (Signed)
Subjective:  Patient ID: Casey Lang, female    DOB: Apr 15, 1980,  MRN: [id number]  Chief Complaint  Patient presents with  . Results    Review MRI results  . PTTD    F/U Rt PTTD Pt.s tats," pain is the same. But the boot usually does help with the pain; 7/10 sharp pains." tx: cam boot -pt denies N/V/F/Ch     38 y.o. female presents with the above complaint.  History as above confirmed with patient. Here for MRI review and surgical planning visit.  Review of Systems: Negative except as noted in the HPI. Denies N/V/F/Ch.  Past Medical History:  Diagnosis Date  . Anxiety   . Bipolar 1 disorder ([place])   . Depression   . Hypertension    pregnancy induced hypertension  . Mental disorder   . Right foot injury 10/09/2012   Resolved without complication    Current Outpatient Medications:  .  clonazePAM (KLONOPIN) 0.5 MG tablet, Take 0.5 mg by mouth daily as needed., Disp: , Rfl: 0 .  gabapentin (NEURONTIN) 300 MG capsule, Take 300 mg by mouth at bedtime., Disp: , Rfl:  .  lamoTRIgine (LAMICTAL) 150 MG tablet, Take 2 tablets (300 mg total) by mouth every evening., Disp: 60 tablet, Rfl: 0 .  QUEtiapine (SEROQUEL) 300 MG tablet, Take 600 mg by mouth at bedtime., Disp: , Rfl:   Social History   Tobacco Use  Smoking Status Never Smoker  Smokeless Tobacco Never Used    Allergies  Allergen Reactions  . Morphine And Related Other (See Comments)    Reaction:  Psychosis    Objective:   Vitals:   06/29/18 1058  Resp: 16  Temp: 98.3 F (36.8 C)   There is no height or weight on file to calculate BMI. Constitutional Well developed. Well nourished.  Vascular Dorsalis pedis pulses palpable bilaterally. Posterior tibial pulses palpable bilaterally. Capillary refill normal to all digits.  No cyanosis or clubbing noted. Pedal hair growth normal.  Neurologic Normal speech. Oriented to person, place, and time. Epicritic sensation to light touch grossly present bilaterally.   Dermatologic Nails well groomed and normal in appearance. No open wounds. No skin lesions.  Orthopedic: Normal joint ROM without pain or crepitus bilaterally. Pes planus with ankle valgus R>L. Prominent navicular R No bony tenderness. Forefoot varus R.     Radiographs: 8/19: Severe ankle valgus. Degenerative changes noted. Evidence of prior bunionectomy bilat. Metatarsus adductus present. Atavistic navicular. Assessment:   1. PTTD (posterior tibial tendon dysfunction)   2. Acquired pes planovalgus of right foot   3. Pre-op evaluation   4. Metatarsus adductus   5. Forefoot varus   6. Acquired valgus deformity of right ankle   7. Hammer toe of right foot   8. Hallux valgus with bunions of right foot    Plan:  Patient was evaluated and treated and all questions answered.  Severe Pes Planus with hindfoot Valgus, R>L, Hallux valgus -MRI reviewed with patient.  Discussed that given that the posterior tibial tendon appears intact with only mild tenosynovitis and the cartilage subtalar joint appears with only thinning compared to significant degenerative changes, would benefit from realignment rather than 36.  Discussed surgical plan for right foot reconstruction.  Discussed due to the extent of the surgical present she needs to have discussing the procedure 2 stages with the first stage being the hindfoot and the second stage in her forefoot.  Discussed that the procedure may very for with left reconstruction.  Possible procedures discussed  include gastroc recession, calcaneal slide osteotomy, lateral column lengthening, tendon transfer.  Stage II procedures will include possible navicular exostectomy, plantarflexory Lapidus bunionectomy, Weil osteotomy second metatarsal  27 minutes of face to face time were spent with the patient. >50% of this was spent on counseling and coordination of care. Specifically discussed with patient the above diagnoses and overall treatment plan.   No  follow-ups on file.

## 2018-06-29 NOTE — Patient Instructions (Signed)
Pre-Operative Instructions  Congratulations, you have decided to take an important step towards improving your quality of life.  You can be assured that the doctors and staff at [facility] will be with you every step of the way.  Here are some important things you should know:  1. Plan to be at the surgery center/hospital at least 1 (one) hour prior to your scheduled time, unless otherwise directed by the surgical center/hospital staff.  You must have a responsible adult accompany you, remain during the surgery and drive you home.  Make sure you have directions to the surgical center/hospital to ensure you arrive on time. 2. If you are having surgery at [facility] or [facility], you will need a copy of your medical history and physical form from your family physician within one month prior to the date of surgery. We will give you a form for your primary physician to complete.  3. We make every effort to accommodate the date you request for surgery.  However, there are times where surgery dates or times have to be moved.  We will contact you as soon as possible if a change in schedule is required.   4. No aspirin/ibuprofen for one week before surgery.  If you are on aspirin, any non-steroidal anti-inflammatory medications (Mobic, Aleve, Ibuprofen) should not be taken seven (7) days prior to your surgery.  You make take Tylenol for pain prior to surgery.  5. Medications - If you are taking daily heart and blood pressure medications, seizure, reflux, allergy, asthma, anxiety, pain or diabetes medications, make sure you notify the surgery center/hospital before the day of surgery so they can tell you which medications you should take or avoid the day of surgery. 6. No food or drink after midnight the night before surgery unless directed otherwise by surgical center/hospital staff. 7. No alcoholic beverages 24-hours prior to surgery.  No smoking 24-hours prior or 24-hours after  surgery. 8. Wear loose pants or shorts. They should be loose enough to fit over bandages, boots, and casts. 9. Don't wear slip-on shoes. Sneakers are preferred. 10. Bring your boot with you to the surgery center/hospital.  Also bring crutches or a walker if your physician has prescribed it for you.  If you do not have this equipment, it will be provided for you after surgery. 11. If you have not been contacted by the surgery center/hospital by the day before your surgery, call to confirm the date and time of your surgery. 12. Leave-time from work may vary depending on the type of surgery you have.  Appropriate arrangements should be made prior to surgery with your employer. 13. Prescriptions will be provided immediately following surgery by your doctor.  Fill these as soon as possible after surgery and take the medication as directed. Pain medications will not be refilled on weekends and must be approved by the doctor. 14. Remove nail polish on the operative foot and avoid getting pedicures prior to surgery. 15. Wash the night before surgery.  The night before surgery wash the foot and leg well with water and the antibacterial soap provided. Be sure to pay special attention to beneath the toenails and in between the toes.  Wash for at least three (3) minutes. Rinse thoroughly with water and dry well with a towel.  Perform this wash unless told not to do so by your physician.  Enclosed: 1 Ice pack (please put in freezer the night before surgery)   1 Hibiclens skin cleaner     Pre-op instructions  If you have any questions regarding the instructions, please do not hesitate to call our office.  [place]: [address], [place], [place] [address] -- [phone]  [place]: [address] [place], [place] [address] -- [phone]  [place]: [address]  [place], [place] [address] -- [phone]  [place]: [address], [place], [place] [address] -- [phone]  Website:  [email or web address] 

## 2018-06-30 ENCOUNTER — Telehealth: Payer: Self-pay | Admitting: *Deleted

## 2018-06-30 NOTE — Telephone Encounter (Signed)
"  I'm calling to schedule my surgery with Dr. March Rummage."  Dr. March Rummage does surgeries on Wednesdays.  Do you have a date that you like?  "I will take his next available date."  He can do it on July 22, 2018. "Perfect, that will be fine."  Someone from the surgical center will call you a day or two prior to your surgery date and that person will give you your arrival time.  You need to register online with the surgical center via their [employer] Portal, the instructions are in the brochure that we gave you.

## 2018-07-01 ENCOUNTER — Telehealth: Payer: Self-pay | Admitting: *Deleted

## 2018-07-01 NOTE — Telephone Encounter (Signed)
"  I saw Dr. March Rummage and Rene Paci been approved for my right foot surgery.  I'm supposed to have another surgery on the same foot.  He said it should be scheduled two to three weeks later after the first surgery.  Please give me a call."

## 2018-07-17 ENCOUNTER — Telehealth: Payer: Self-pay | Admitting: *Deleted

## 2018-07-17 NOTE — Telephone Encounter (Signed)
DOS 07/22/2018, CPT CODES: 01601 - FLEXOR TENDON TRANSFER, 09323 - GASTROCNEMIUS RECESSION, AND 55732 - EVAN CALCANEAL OSTEOTOMY X 2 RIGHT FOOT  UMR: Effective Date - 04/12/2018   Annual Deductible Per Calendar Year     $1,500     . Per Family   $3,000     ? Individual Embedded Deductible   $1,500    Plan Participation Rate, Unless Otherwise Stated Below:  . Paid By Plan After Satisfaction Of Deductible   202%    PRE-CERT IS NOT REQUIRED: [id number]

## 2018-07-20 NOTE — Telephone Encounter (Signed)
We are going to do her consent forms at her 1st post-op visit. We will plan for her 2nd surgery about 3 weeks after the first

## 2018-07-22 ENCOUNTER — Encounter: Payer: Self-pay | Admitting: Podiatry

## 2018-07-22 ENCOUNTER — Other Ambulatory Visit: Payer: Self-pay | Admitting: Podiatry

## 2018-07-22 ENCOUNTER — Telehealth: Payer: Self-pay | Admitting: *Deleted

## 2018-07-22 ENCOUNTER — Other Ambulatory Visit: Payer: Self-pay | Admitting: *Deleted

## 2018-07-22 DIAGNOSIS — M216X1 Other acquired deformities of right foot: Secondary | ICD-10-CM

## 2018-07-22 DIAGNOSIS — M76822 Posterior tibial tendinitis, left leg: Secondary | ICD-10-CM

## 2018-07-22 DIAGNOSIS — Q666 Other congenital valgus deformities of feet: Secondary | ICD-10-CM | POA: Diagnosis not present

## 2018-07-22 MED ORDER — CEPHALEXIN 500 MG PO CAPS: 500.0000 mg | ORAL_CAPSULE | Freq: Two times a day (BID) | ORAL | 0 refills | Status: DC

## 2018-07-22 MED ORDER — OXYCODONE-ACETAMINOPHEN 10-325 MG PO TABS: 1.0000 | ORAL_TABLET | ORAL | 0 refills | Status: DC | PRN

## 2018-07-22 NOTE — Telephone Encounter (Signed)
[facility] - Erline Levine states they have an question concerning pt's prescription.

## 2018-07-22 NOTE — Progress Notes (Signed)
Rx sent to pharmacy for outpatient surgery. °

## 2018-07-22 NOTE — Telephone Encounter (Signed)
I spoke with [facility] - Erline Levine states pt is opioid naive need to write as maximum a day as 3. I okayed this order.

## 2018-07-22 NOTE — Progress Notes (Signed)
Per Dr. March Rummage, I placed an order for a knee scooter.  The scooter is to be used post-operatively to assist with her mobility.  She is non-weight bearing.  I requested assistance from Jolee Ewing of [place] in getting the knee scooter to the patient.

## 2018-07-23 ENCOUNTER — Telehealth: Payer: Self-pay

## 2018-07-23 NOTE — Telephone Encounter (Signed)
POST OP CALL-    1) General condition stated by the patient: Doing okay  2) Is the pt having pain?  A little, nothing unmanageable  3) Pain score:   4) Has the pt taken Rx'd pain medication, regularly or PRN?   5) Is the pain medication giving relief?  6) Any fever, chills, nausea, or vomiting, shortness of breath or tightness in calf? None  7) Is the bandage clean, dry and intact? Yes  8) Is there excessive tightness, bleeding or drainage coming through the bandage? No  9) Did you understand all of the post op instruction sheet given? Yes  10) Any questions or concerns regarding post op care/recovery? No    Confirmed POV appointment with patient

## 2018-07-23 NOTE — Telephone Encounter (Signed)
I left her a message that Dr. March Rummage will discuss her next surgery with her when she comes in for her post-operative appointment.  I told her that he wants to do it three weeks later after the first surgery.

## 2018-07-27 ENCOUNTER — Ambulatory Visit (INDEPENDENT_AMBULATORY_CARE_PROVIDER_SITE_OTHER): Payer: Commercial Managed Care - PPO

## 2018-07-27 ENCOUNTER — Ambulatory Visit (INDEPENDENT_AMBULATORY_CARE_PROVIDER_SITE_OTHER): Payer: Commercial Managed Care - PPO | Admitting: Podiatry

## 2018-07-27 ENCOUNTER — Other Ambulatory Visit: Payer: Self-pay

## 2018-07-27 DIAGNOSIS — M2041 Other hammer toe(s) (acquired), right foot: Secondary | ICD-10-CM | POA: Diagnosis not present

## 2018-07-27 DIAGNOSIS — M2011 Hallux valgus (acquired), right foot: Secondary | ICD-10-CM | POA: Diagnosis not present

## 2018-07-27 DIAGNOSIS — M76822 Posterior tibial tendinitis, left leg: Secondary | ICD-10-CM | POA: Diagnosis not present

## 2018-07-27 DIAGNOSIS — M2141 Flat foot [pes planus] (acquired), right foot: Secondary | ICD-10-CM

## 2018-07-27 DIAGNOSIS — M21611 Bunion of right foot: Secondary | ICD-10-CM

## 2018-07-27 DIAGNOSIS — M79671 Pain in right foot: Secondary | ICD-10-CM

## 2018-07-27 DIAGNOSIS — Z9889 Other specified postprocedural states: Secondary | ICD-10-CM

## 2018-07-27 DIAGNOSIS — D689 Coagulation defect, unspecified: Secondary | ICD-10-CM

## 2018-07-27 MED ORDER — HYDROMORPHONE HCL 2 MG PO TABS: 2.0000 mg | ORAL_TABLET | ORAL | 0 refills | Status: DC | PRN

## 2018-07-27 NOTE — Progress Notes (Signed)
Subjective:  Patient ID: Casey Lang, female    DOB: [birth year],  MRN: [id number]  Chief Complaint  Patient presents with  . Routine Post Op    POV#1 DOS 07/22/2018 FLEXOR TENDON TRANSFER, EVAN CALCANEAL OSTEOTOMY X 2, AND GASTROCNEMIUS RECESS RT  no N/V/F/CH pain is under control     DOS: 07/22/2018 Procedure: Flatfoot Correction R with Double Calcaneal Osteotomy, Gastrocnemius Recession  38 y.o. female returns for post-op check. Pain controlled. Had a lot of pain when the block wore off. Otherwise denies post-op issues. Using crutches today.  Review of Systems: Negative except as noted in the HPI. Denies N/V/F/Ch.  Past Medical History:  Diagnosis Date  . Anxiety   . Bipolar 1 disorder ([place])   . Depression   . Hypertension    pregnancy induced hypertension  . Mental disorder   . Right foot injury 10/09/2012   Resolved without complication    Current Outpatient Medications:  .  cephALEXin (KEFLEX) 500 MG capsule, Take 1 capsule (500 mg total) by mouth 2 (two) times daily., Disp: 14 capsule, Rfl: 0 .  clonazePAM (KLONOPIN) 0.5 MG tablet, Take 0.5 mg by mouth daily as needed., Disp: , Rfl: 0 .  gabapentin (NEURONTIN) 300 MG capsule, Take 300 mg by mouth at bedtime., Disp: , Rfl:  .  HYDROmorphone (DILAUDID) 2 MG tablet, Take 1 tablet (2 mg total) by mouth every 4 (four) hours as needed for severe pain., Disp: 20 tablet, Rfl: 0 .  lamoTRIgine (LAMICTAL) 150 MG tablet, Take 2 tablets (300 mg total) by mouth every evening., Disp: 60 tablet, Rfl: 0 .  oxyCODONE-acetaminophen (PERCOCET) 10-325 MG tablet, Take 1 tablet by mouth every 4 (four) hours as needed for pain., Disp: 20 tablet, Rfl: 0 .  QUEtiapine (SEROQUEL) 300 MG tablet, Take 600 mg by mouth at bedtime., Disp: , Rfl:   Social History   Tobacco Use  Smoking Status Never Smoker  Smokeless Tobacco Never Used    Allergies  Allergen Reactions  . Morphine And Related Other (See Comments)    Reaction:  Psychosis     Objective:  There were no vitals filed for this visit. There is no height or weight on file to calculate BMI. Constitutional Well developed. Well nourished.  Vascular Foot warm and well perfused. Capillary refill normal to all digits.   Neurologic Normal speech. Oriented to person, place, and time. Epicritic sensation to light touch grossly present bilaterally.  Dermatologic Skin healing well without signs of infection. Skin edges well coapted without signs of infection.  Pin site intact without signs of infection  Orthopedic: Tenderness to palpation noted about the surgical site.    Radiographs: Taken and reviewed c/w post-op state. Pin intact across the CC joint holding graft in place. Talar head covered. Prominent navicular. Elevated 1st ray. Elongated 2nd met. Assessment:   1. Post-operative state   2. Hammer toe of right foot   3. Hallux valgus with bunions of right foot   4. Acquired pes planovalgus of right foot   5. Posterior tibial tendinitis of left lower extremity   6. 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  S/p foot surgery right -Progressing as expected post-operatively. -XR: As above -WB Status: NWB in crutches. Knee scooter pending -Sutures: intact. -Medications: Rx Dilaudid -Foot redressed. -Discussed possibly starting anticoagulant therapy however she has hx of coagulopathy and therefore will check CBC, PTT, PT/INR and make determination  thereafter. Patient in agreement. Orders faxed to PCP office to be drawn.  Right hallux Valgus, Hammertoe second toe, first ray elevatus, prominent navicular bone -Discussed with patient proceeding with second stage of her foot reconstruction including reconstruction of her forefoot -Patient has failed all conservative therapy and wishes to proceed with surgical intervention. All risks, benefits, and alternatives discussed with patient.  No guarantees given. Consent reviewed and signed by patient. -Planned procedures: Right foot resection of navicular bone of possible advancement posterior tibial tendon, first tarsometatarsal arthrodesis with bunionectomy, possible great toe straightening osteotomy, second metatarsal shortening osteotomy, correction second toe hammertoe  No follow-ups on file.

## 2018-07-29 ENCOUNTER — Telehealth: Payer: Self-pay | Admitting: *Deleted

## 2018-07-29 NOTE — Telephone Encounter (Signed)
"  I'm calling to schedule my surgery with Dr. March Rummage."  He said July 1 or July 8.  It will have to be August 19, 2018.  "July 8 will be fine."  I'll get it scheduled.  Someone from the surgery center will give you a call a day or two prior to your surgery date and that person will give you your arrival time.

## 2018-08-03 ENCOUNTER — Other Ambulatory Visit: Payer: Self-pay

## 2018-08-03 ENCOUNTER — Ambulatory Visit (INDEPENDENT_AMBULATORY_CARE_PROVIDER_SITE_OTHER): Payer: Commercial Managed Care - PPO | Admitting: Podiatry

## 2018-08-03 ENCOUNTER — Telehealth: Payer: Self-pay | Admitting: *Deleted

## 2018-08-03 DIAGNOSIS — R609 Edema, unspecified: Secondary | ICD-10-CM

## 2018-08-03 DIAGNOSIS — M79661 Pain in right lower leg: Secondary | ICD-10-CM

## 2018-08-03 DIAGNOSIS — Z9889 Other specified postprocedural states: Secondary | ICD-10-CM

## 2018-08-03 DIAGNOSIS — I824Z1 Acute embolism and thrombosis of unspecified deep veins of right distal lower extremity: Secondary | ICD-10-CM

## 2018-08-03 LAB — CBC WITH DIFFERENTIAL/PLATELET
Basophils Absolute: 0 10*3/uL (ref 0.0–0.2)
Basos: 0 %
EOS (ABSOLUTE): 0 10*3/uL (ref 0.0–0.4)
Eos: 1 %
Hematocrit: 33.1 % — ABNORMAL LOW (ref 34.0–46.6)
Hemoglobin: 11.2 g/dL (ref 11.1–15.9)
Lymphocytes Absolute: 1.9 10*3/uL (ref 0.7–3.1)
Lymphs: 35 %
MCH: 29.2 pg (ref 26.6–33.0)
MCHC: 33.8 g/dL (ref 31.5–35.7)
MCV: 86 fL (ref 79–97)
Monocytes Absolute: 0.4 10*3/uL (ref 0.1–0.9)
Monocytes: 8 %
Neutrophils Absolute: 3 10*3/uL (ref 1.4–7.0)
Neutrophils: 56 %
Platelets: 499 10*3/uL — ABNORMAL HIGH (ref 150–450)
RBC: 3.83 x10E6/uL (ref 3.77–5.28)
RDW: 14.6 % (ref 11.7–15.4)
WBC: 5.4 10*3/uL (ref 3.4–10.8)

## 2018-08-03 LAB — PROTIME-INR
INR: 1 (ref 0.8–1.2)
Prothrombin Time: 10 s (ref 9.1–12.0)

## 2018-08-03 LAB — APTT: aPTT: 30 s (ref 24–33)

## 2018-08-03 MED ORDER — HYDROMORPHONE HCL 2 MG PO TABS: 2.0000 mg | ORAL_TABLET | ORAL | 0 refills | Status: DC | PRN

## 2018-08-03 NOTE — Progress Notes (Signed)
  Subjective:  Patient ID: Casey Lang, female    DOB: 1980/11/03,  MRN: [id number]  No chief complaint on file.   DOS: 07/22/2018 Procedure: Flatfoot Correction R with Double Calcaneal Osteotomy, Gastrocnemius Recession  38 y.o. female returns for post-op check. Pain better controlled with dilaudid. Taking 1/2 tablet every six hours. Having more pain in her calf than in her foot.   Review of Systems: Negative except as noted in the HPI. Denies N/V/F/Ch.  Past Medical History:  Diagnosis Date  . Anxiety   . Bipolar 1 disorder ([place])   . Depression   . Hypertension    pregnancy induced hypertension  . Mental disorder   . Right foot injury 10/09/2012   Resolved without complication    Current Outpatient Medications:  .  cephALEXin (KEFLEX) 500 MG capsule, Take 1 capsule (500 mg total) by mouth 2 (two) times daily., Disp: 14 capsule, Rfl: 0 .  clonazePAM (KLONOPIN) 0.5 MG tablet, Take 0.5 mg by mouth daily as needed., Disp: , Rfl: 0 .  gabapentin (NEURONTIN) 300 MG capsule, Take 300 mg by mouth at bedtime., Disp: , Rfl:  .  HYDROmorphone (DILAUDID) 2 MG tablet, Take 1 tablet (2 mg total) by mouth every 4 (four) hours as needed for severe pain., Disp: 20 tablet, Rfl: 0 .  lamoTRIgine (LAMICTAL) 150 MG tablet, Take 2 tablets (300 mg total) by mouth every evening., Disp: 60 tablet, Rfl: 0 .  oxyCODONE-acetaminophen (PERCOCET) 10-325 MG tablet, Take 1 tablet by mouth every 4 (four) hours as needed for pain., Disp: 20 tablet, Rfl: 0 .  QUEtiapine (SEROQUEL) 300 MG tablet, Take 600 mg by mouth at bedtime., Disp: , Rfl:   Social History   Tobacco Use  Smoking Status Never Smoker  Smokeless Tobacco Never Used    Allergies  Allergen Reactions  . Morphine And Related Other (See Comments)    Reaction:  Psychosis    Objective:  There were no vitals filed for this visit. There is no height or weight on file to calculate BMI. Constitutional Well developed. Well nourished.   Vascular Foot warm and well perfused. Capillary refill normal to all digits.   Neurologic Normal speech. Oriented to person, place, and time. Epicritic sensation to light touch grossly present bilaterally.  Dermatologic Skin healing well without signs of infection. Skin edges well coapted without signs of infection.  Pin site intact without signs of infection  Orthopedic: Tenderness to palpation noted about the surgical site.  Calf tenderness with local warmth   Radiographs: None today Assessment:   1. Post-operative state    Plan:  Patient was evaluated and treated and all questions answered.  S/p foot surgery right -Progressing as expected post-operatively. -XR: None -WB Status: NWB in crutches. Knee scooter pending -Sutures: intact. -Medications: Refill Dilaudid -Foot redressed. -Order DVT US. Eval for DVT vs Hematoma -Did not get blood drawn for labs. Given labs again to be drawn today. Will consider starting AC based upon results   Right hallux Valgus, Hammertoe second toe, first ray elevatus, prominent navicular bone -Pending stage II surgery. -Planned procedures: Right foot resection of navicular bone of possible advancement posterior tibial tendon, first tarsometatarsal arthrodesis with bunionectomy, possible great toe straightening osteotomy, second metatarsal shortening osteotomy, correction second toe hammertoe  No follow-ups on file.

## 2018-08-03 NOTE — Telephone Encounter (Signed)
[place] [facility] scheduled pt for Venous doppler right [phone] today arrive 4:00pm for 4:30pm testing. Faxed to [facility] and M. Leslee Home, CMA to inform pt.

## 2018-08-04 ENCOUNTER — Telehealth: Payer: Self-pay | Admitting: Podiatry

## 2018-08-04 NOTE — Telephone Encounter (Signed)
Called and discussed lab and [place] results  Labs mostly normal, INR of 1, normal Hgb, slightly elevated Platelets  [place] reviewed. Large fluid collection in calf, no DVT. Possible hematoma.   Discussed given hematoma will avoid therapeutic AC at this time. Patient in agreement. Discussed warm compress to resolve fluid collection. Will plan for lovenox after her second surgery. Made post-op appt for patient on July 6th.

## 2018-08-05 ENCOUNTER — Telehealth: Payer: Self-pay | Admitting: *Deleted

## 2018-08-05 NOTE — Telephone Encounter (Signed)
DOS 08/19/2018; [address] - [employer], [place], [phone] - HAMMER TOE REPAIR 2ND, AND [id number] [facility] ADVANCED TENDON RIGHT FOOT  UMR: Effective Date - 04/12/2018  Benefit percentage  100%Plan pays  0%You pay  Individual deductible $1,070.70 to go  $429.30 out of $1,500

## 2018-08-17 ENCOUNTER — Encounter: Payer: Self-pay | Admitting: Podiatry

## 2018-08-17 ENCOUNTER — Other Ambulatory Visit: Payer: Self-pay | Admitting: Podiatry

## 2018-08-17 ENCOUNTER — Ambulatory Visit (INDEPENDENT_AMBULATORY_CARE_PROVIDER_SITE_OTHER): Payer: Commercial Managed Care - PPO | Admitting: Podiatry

## 2018-08-17 ENCOUNTER — Ambulatory Visit (INDEPENDENT_AMBULATORY_CARE_PROVIDER_SITE_OTHER): Payer: Commercial Managed Care - PPO

## 2018-08-17 ENCOUNTER — Other Ambulatory Visit: Payer: Self-pay

## 2018-08-17 VITALS — BP 117/79 | HR 95 | Temp 98.1°F | Resp 16

## 2018-08-17 DIAGNOSIS — M2011 Hallux valgus (acquired), right foot: Secondary | ICD-10-CM

## 2018-08-17 DIAGNOSIS — M21611 Bunion of right foot: Secondary | ICD-10-CM

## 2018-08-17 DIAGNOSIS — Q742 Other congenital malformations of lower limb(s), including pelvic girdle: Secondary | ICD-10-CM

## 2018-08-17 DIAGNOSIS — M2041 Other hammer toe(s) (acquired), right foot: Secondary | ICD-10-CM

## 2018-08-17 DIAGNOSIS — Z9889 Other specified postprocedural states: Secondary | ICD-10-CM

## 2018-08-17 DIAGNOSIS — M2141 Flat foot [pes planus] (acquired), right foot: Secondary | ICD-10-CM

## 2018-08-17 MED ORDER — ENOXAPARIN SODIUM 30 MG/0.3ML ~~LOC~~ SOLN: 30.0000 mg | Freq: Two times a day (BID) | SUBCUTANEOUS | 0 refills | Status: DC

## 2018-08-17 MED ORDER — CEPHALEXIN 500 MG PO CAPS: 500.0000 mg | ORAL_CAPSULE | Freq: Two times a day (BID) | ORAL | 0 refills | Status: DC

## 2018-08-17 MED ORDER — OXYCODONE-ACETAMINOPHEN 10-325 MG PO TABS: 1.0000 | ORAL_TABLET | ORAL | 0 refills | Status: DC | PRN

## 2018-08-17 NOTE — Progress Notes (Signed)
Subjective:  Patient ID: Casey Lang, female    DOB: [birth year],  MRN: [id number]  Chief Complaint  Patient presents with  . Routine Post Op    Pt., states," been doing okay, right now ther's no pain at all, but yesteray I had a very severe pain at my heel; 8/10 stabbign pain." tx: crutches, boot, tylenol and heating pads -pt denies N/V?F?Ch     DOS: 07/22/2018 Procedure: Flatfoot Correction R with Double Calcaneal Osteotomy, Gastrocnemius Recession  38 y.o. female returns for post-op check. Doing well. Not having much pain today. Not taking much pain medication.  Has some questions about Wednesday's planned surgical procedures.  Review of Systems: Negative except as noted in the HPI. Denies N/V/F/Ch.  Past Medical History:  Diagnosis Date  . Anxiety   . Bipolar 1 disorder ([place])   . Depression   . Hypertension    pregnancy induced hypertension  . Mental disorder   . Right foot injury 10/09/2012   Resolved without complication    Current Outpatient Medications:  .  cephALEXin (KEFLEX) 500 MG capsule, Take 1 capsule (500 mg total) by mouth 2 (two) times daily., Disp: 14 capsule, Rfl: 0 .  clonazePAM (KLONOPIN) 0.5 MG tablet, Take 0.5 mg by mouth daily as needed., Disp: , Rfl: 0 .  enoxaparin (LOVENOX) 30 MG/0.3ML injection, Inject 0.3 mLs (30 mg total) into the skin every 12 (twelve) hours for 28 doses., Disp: 8.4 mL, Rfl: 0 .  gabapentin (NEURONTIN) 300 MG capsule, Take 300 mg by mouth at bedtime., Disp: , Rfl:  .  HYDROmorphone (DILAUDID) 2 MG tablet, Take 1 tablet (2 mg total) by mouth every 4 (four) hours as needed for severe pain., Disp: 20 tablet, Rfl: 0 .  lamoTRIgine (LAMICTAL) 150 MG tablet, Take 2 tablets (300 mg total) by mouth every evening., Disp: 60 tablet, Rfl: 0 .  oxyCODONE-acetaminophen (PERCOCET) 10-325 MG tablet, Take 1 tablet by mouth every 4 (four) hours as needed for pain., Disp: 20 tablet, Rfl: 0 .  QUEtiapine (SEROQUEL) 300 MG tablet, Take 600 mg by  mouth at bedtime., Disp: , Rfl:   Social History   Tobacco Use  Smoking Status Never Smoker  Smokeless Tobacco Never Used    Allergies  Allergen Reactions  . Morphine And Related Other (See Comments)    Reaction:  Psychosis    Objective:   Vitals:   08/17/18 0902  BP: 117/79  Pulse: 95  Resp: 16  Temp: 98.1 F (36.7 C)   There is no height or weight on file to calculate BMI. Constitutional Well developed. Well nourished.  Vascular Foot warm and well perfused. Capillary refill normal to all digits.   Neurologic Normal speech. Oriented to person, place, and time. Epicritic sensation to light touch grossly present bilaterally.  Dermatologic Skin healing well without signs of infection. Skin edges well coapted without signs of infection.  Pin site intact without signs of infection  Orthopedic: Tenderness to palpation noted about the surgical site.  Calf tenderness without local warmth, hematoma appears to be resolving. Hallux valgus, hammertoe right 2nd toe, prominent navicular right foot.   Radiographs: Taken and reviewed. Good alignment and osseous healing noted. Assessment:   1. Hammer toe of right foot   2. Hallux valgus with bunions of right foot   3. Acquired pes planovalgus of right foot   4. Accessory navicular bone of right foot    Plan:  Patient was evaluated and treated and all questions answered.  S/p foot surgery right -  Progressing as expected post-operatively. -XR: taken and reviewed. -WB Status: NWB in crutches. Knee scooter pending -Sutures: intact. -Foot redressed.  Right hallux Valgus, Hammertoe second toe, first ray elevatus, prominent navicular bone -Reviewed surgical plan with patient. -Wrote for post-op medications. Discussed Percocet for pain control, lovenox for DVT ppx, Keflex for abx ppx. -Planned procedures: Right foot resection of navicular bone of possible advancement posterior tibial tendon, first tarsometatarsal arthrodesis with  bunionectomy, possible great toe straightening osteotomy, second metatarsal shortening osteotomy, correction second toe hammertoe  Return for post-op .

## 2018-08-18 ENCOUNTER — Other Ambulatory Visit: Payer: Self-pay | Admitting: Podiatry

## 2018-08-18 DIAGNOSIS — Z9889 Other specified postprocedural states: Secondary | ICD-10-CM

## 2018-08-18 DIAGNOSIS — M2011 Hallux valgus (acquired), right foot: Secondary | ICD-10-CM

## 2018-08-18 DIAGNOSIS — M2041 Other hammer toe(s) (acquired), right foot: Secondary | ICD-10-CM

## 2018-08-19 ENCOUNTER — Encounter: Payer: Self-pay | Admitting: Podiatry

## 2018-08-19 DIAGNOSIS — M6701 Short Achilles tendon (acquired), right ankle: Secondary | ICD-10-CM

## 2018-08-19 DIAGNOSIS — M2041 Other hammer toe(s) (acquired), right foot: Secondary | ICD-10-CM

## 2018-08-19 DIAGNOSIS — M21541 Acquired clubfoot, right foot: Secondary | ICD-10-CM

## 2018-08-19 DIAGNOSIS — Z4889 Encounter for other specified surgical aftercare: Secondary | ICD-10-CM

## 2018-08-19 DIAGNOSIS — M2011 Hallux valgus (acquired), right foot: Secondary | ICD-10-CM

## 2018-08-20 ENCOUNTER — Telehealth: Payer: Self-pay | Admitting: *Deleted

## 2018-08-20 NOTE — Telephone Encounter (Signed)
Called and spoke with the patient and the patient stated the block wore off and called the [name] office and talked to melody and melody stated to put ice behind the knee which the patient has done and not really helping and was told not to take the tylenol and to use the ibuprofen but can't take that due to gastic and the patient stated that the percocet makes her feel hazy and per Dr March Rummage stated to take the percocet and cut it in half and take every 6 hours to see how that does and to call the office if any concerns or questions. Casey Lang

## 2018-08-25 ENCOUNTER — Encounter: Payer: Self-pay | Admitting: Podiatry

## 2018-08-25 ENCOUNTER — Other Ambulatory Visit: Payer: Self-pay | Admitting: Podiatry

## 2018-08-25 ENCOUNTER — Ambulatory Visit (INDEPENDENT_AMBULATORY_CARE_PROVIDER_SITE_OTHER): Payer: Commercial Managed Care - PPO | Admitting: Podiatry

## 2018-08-25 ENCOUNTER — Other Ambulatory Visit: Payer: Self-pay

## 2018-08-25 ENCOUNTER — Ambulatory Visit (INDEPENDENT_AMBULATORY_CARE_PROVIDER_SITE_OTHER): Payer: Commercial Managed Care - PPO

## 2018-08-25 VITALS — Temp 97.9°F | Resp 16

## 2018-08-25 DIAGNOSIS — Z9889 Other specified postprocedural states: Secondary | ICD-10-CM

## 2018-08-25 DIAGNOSIS — M2041 Other hammer toe(s) (acquired), right foot: Secondary | ICD-10-CM

## 2018-08-25 DIAGNOSIS — R600 Localized edema: Secondary | ICD-10-CM

## 2018-08-25 DIAGNOSIS — M2011 Hallux valgus (acquired), right foot: Secondary | ICD-10-CM

## 2018-08-25 DIAGNOSIS — M76822 Posterior tibial tendinitis, left leg: Secondary | ICD-10-CM

## 2018-08-25 DIAGNOSIS — Q742 Other congenital malformations of lower limb(s), including pelvic girdle: Secondary | ICD-10-CM

## 2018-08-25 DIAGNOSIS — M21611 Bunion of right foot: Secondary | ICD-10-CM

## 2018-08-25 DIAGNOSIS — M2141 Flat foot [pes planus] (acquired), right foot: Secondary | ICD-10-CM

## 2018-08-25 MED ORDER — OXYCODONE-ACETAMINOPHEN 10-325 MG PO TABS: 1.0000 | ORAL_TABLET | ORAL | 0 refills | Status: DC | PRN

## 2018-08-25 NOTE — Progress Notes (Signed)
Subjective:  Patient ID: Casey Lang, female    DOB: 01-26-1981,  MRN: [id number]  Chief Complaint  Patient presents with  . Routine Post Op    POV#1 DOS 08/19/2018 AIKEN OSTEOTOMY, LAPIDUS W/ BUNIONECTOMY, METATARSAL OSTEOTOMY 2ND, HAMMER TOE REPAIR 2ND, AND [place] ADVANCED TENDON RT -pt states pain is controlled with PRN meds and denies any issues -pt denies N/V/?FCH     DOS: 07/22/2018 Procedure: Flatfoot Correction R with Double Calcaneal Osteotomy, Gastrocnemius Recession  38 y.o. female returns for post-op check. States the block wore off shortly the day after surgery. Pain is controlled now with Percocet. Denies post-op issues.  Review of Systems: Negative except as noted in the HPI. Denies N/V/F/Ch.  Past Medical History:  Diagnosis Date  . Anxiety   . Bipolar 1 disorder ([place])   . Depression   . Hypertension    pregnancy induced hypertension  . Mental disorder   . Right foot injury 10/09/2012   Resolved without complication    Current Outpatient Medications:  .  cephALEXin (KEFLEX) 500 MG capsule, Take 1 capsule (500 mg total) by mouth 2 (two) times daily., Disp: 14 capsule, Rfl: 0 .  clonazePAM (KLONOPIN) 0.5 MG tablet, Take 0.5 mg by mouth daily as needed., Disp: , Rfl: 0 .  enoxaparin (LOVENOX) 30 MG/0.3ML injection, Inject 0.3 mLs (30 mg total) into the skin every 12 (twelve) hours for 28 doses., Disp: 8.4 mL, Rfl: 0 .  gabapentin (NEURONTIN) 300 MG capsule, Take 300 mg by mouth at bedtime., Disp: , Rfl:  .  HYDROmorphone (DILAUDID) 2 MG tablet, Take 1 tablet (2 mg total) by mouth every 4 (four) hours as needed for severe pain., Disp: 20 tablet, Rfl: 0 .  lamoTRIgine (LAMICTAL) 150 MG tablet, Take 2 tablets (300 mg total) by mouth every evening., Disp: 60 tablet, Rfl: 0 .  oxyCODONE-acetaminophen (PERCOCET) 10-325 MG tablet, Take 1 tablet by mouth every 4 (four) hours as needed for pain., Disp: 20 tablet, Rfl: 0 .  QUEtiapine (SEROQUEL) 300 MG tablet, Take 600 mg  by mouth at bedtime., Disp: , Rfl:   Social History   Tobacco Use  Smoking Status Never Smoker  Smokeless Tobacco Never Used    Allergies  Allergen Reactions  . Morphine And Related Other (See Comments)    Reaction:  Psychosis    Objective:   Vitals:   08/25/18 1125  Resp: 16  Temp: 97.9 F (36.6 C)   There is no height or weight on file to calculate BMI. Constitutional Well developed. Well nourished.  Vascular Foot warm and well perfused. Capillary refill normal to all digits.   Neurologic Normal speech. Oriented to person, place, and time. Epicritic sensation to light touch grossly present bilaterally.  Dermatologic Skin healing well without signs of infection. Skin edges well coapted without signs of infection.  Pin site intact without signs of infection  Orthopedic: Tenderness to palpation noted about the surgical site.  Edema right forefoot. Hallux rectus, 2nd toe rectus, intact pin.    Radiographs: Taken and reviewed. C/w post-op state. Good alignment of 1st ray, good alignment of tarsometatarsal joint with intact fixation, pin intact 2nd MPJ. Resection navicular noted. Assessment:   1. Post-operative state   2. Hammer toe of right foot   3. Hallux valgus with bunions of right foot   4. Acquired pes planovalgus of right foot   5. Accessory navicular bone of right foot   6. Localized edema    Plan:  Patient was evaluated and treated  and all questions answered.  S/p foot surgery right, now s/p both forefoot and rearfoot reconstruction. -Progressing as expected post-operatively. -Unna boot applied to reduce edema and to act as soft cast.  -XR: taken and reviewed. -WB Status: NWB in crutches. -Sutures: removed almost all staples from Phase 1 surgery. Phase 2 surgery staples left intact. -Foot redressed.    No follow-ups on file.

## 2018-09-01 ENCOUNTER — Encounter: Payer: Commercial Managed Care - PPO | Admitting: Podiatry

## 2018-09-07 ENCOUNTER — Other Ambulatory Visit: Payer: Self-pay

## 2018-09-07 ENCOUNTER — Ambulatory Visit (INDEPENDENT_AMBULATORY_CARE_PROVIDER_SITE_OTHER): Payer: Commercial Managed Care - PPO | Admitting: Podiatry

## 2018-09-07 DIAGNOSIS — M76822 Posterior tibial tendinitis, left leg: Secondary | ICD-10-CM

## 2018-09-07 DIAGNOSIS — M2011 Hallux valgus (acquired), right foot: Secondary | ICD-10-CM

## 2018-09-07 DIAGNOSIS — M2141 Flat foot [pes planus] (acquired), right foot: Secondary | ICD-10-CM

## 2018-09-07 DIAGNOSIS — M2041 Other hammer toe(s) (acquired), right foot: Secondary | ICD-10-CM

## 2018-09-07 DIAGNOSIS — Q742 Other congenital malformations of lower limb(s), including pelvic girdle: Secondary | ICD-10-CM

## 2018-09-07 DIAGNOSIS — M21611 Bunion of right foot: Secondary | ICD-10-CM

## 2018-09-07 DIAGNOSIS — Z9889 Other specified postprocedural states: Secondary | ICD-10-CM

## 2018-09-07 NOTE — Progress Notes (Signed)
Subjective:  Patient ID: Casey Lang, female    DOB: 1980/12/01,  MRN: [id number]  No chief complaint on file.   DOS: 07/22/2018 Procedure: Flatfoot Correction R with Double Calcaneal Osteotomy, Gastrocnemius Recession  DOS: 08/19/2018 Procedure: R Lapidus Bunionectomy, 2nd metatarsal osteotomy, 2nd hammertoe correction, navicular resection with PT tendon advancement.  38 y.o. female returns for post-op check. Doing well not having pain hasn't taken a pain pill since Thursday.  Review of Systems: Negative except as noted in the HPI. Denies N/V/F/Ch.  Past Medical History:  Diagnosis Date  . Anxiety   . Bipolar 1 disorder ([place])   . Depression   . Hypertension    pregnancy induced hypertension  . Mental disorder   . Right foot injury 10/09/2012   Resolved without complication    Current Outpatient Medications:  .  cephALEXin (KEFLEX) 500 MG capsule, Take 1 capsule (500 mg total) by mouth 2 (two) times daily., Disp: 14 capsule, Rfl: 0 .  clonazePAM (KLONOPIN) 0.5 MG tablet, Take 0.5 mg by mouth daily as needed., Disp: , Rfl: 0 .  enoxaparin (LOVENOX) 30 MG/0.3ML injection, Inject 0.3 mLs (30 mg total) into the skin every 12 (twelve) hours for 28 doses., Disp: 8.4 mL, Rfl: 0 .  gabapentin (NEURONTIN) 300 MG capsule, Take 300 mg by mouth at bedtime., Disp: , Rfl:  .  HYDROmorphone (DILAUDID) 2 MG tablet, Take 1 tablet (2 mg total) by mouth every 4 (four) hours as needed for severe pain., Disp: 20 tablet, Rfl: 0 .  lamoTRIgine (LAMICTAL) 150 MG tablet, Take 2 tablets (300 mg total) by mouth every evening., Disp: 60 tablet, Rfl: 0 .  oxyCODONE-acetaminophen (PERCOCET) 10-325 MG tablet, Take 1 tablet by mouth every 4 (four) hours as needed for pain., Disp: 20 tablet, Rfl: 0 .  QUEtiapine (SEROQUEL) 300 MG tablet, Take 600 mg by mouth at bedtime., Disp: , Rfl:   Social History   Tobacco Use  Smoking Status Never Smoker  Smokeless Tobacco Never Used    Allergies  Allergen  Reactions  . Morphine And Related Other (See Comments)    Reaction:  Psychosis    Objective:   There were no vitals filed for this visit. There is no height or weight on file to calculate BMI. Constitutional Well developed. Well nourished.  Vascular Foot warm and well perfused. Capillary refill normal to all digits.   Neurologic Normal speech. Oriented to person, place, and time. Epicritic sensation to light touch grossly present bilaterally.  Dermatologic Skin healing well without signs of infection. Skin edges well coapted without signs of infection.  Pin site intact without signs of infection  Orthopedic: Tenderness to palpation noted about the surgical site.  Edema right forefoot. Hallux rectus, 2nd toe rectus, intact pin.    Radiographs: None today. Assessment:   1. Posterior tibial tendinitis of left lower extremity   2. Post-operative state   3. Hallux valgus with bunions of right foot   4. Hammer toe of right foot   5. Acquired pes planovalgus of right foot   6. Accessory navicular bone of right foot    Plan:  Patient was evaluated and treated and all questions answered.  S/p foot surgery right, now s/p both forefoot and rearfoot reconstruction. -Progressing as expected post-operatively. -XR: taken and reviewed. -WB Status: NWB in crutches. -Sutures: removed almost all sutures and staples from Phase 1 surgery. Removed sutures 2nd toe. Phase 2 staples left intact. -Foot redressed.    No follow-ups on file. XR at next  visit. Plan for staple removal of Phase 2 surgery.

## 2018-09-08 ENCOUNTER — Other Ambulatory Visit: Payer: Self-pay | Admitting: Podiatry

## 2018-09-08 DIAGNOSIS — M76822 Posterior tibial tendinitis, left leg: Secondary | ICD-10-CM

## 2018-09-08 DIAGNOSIS — Z9889 Other specified postprocedural states: Secondary | ICD-10-CM

## 2018-09-14 ENCOUNTER — Other Ambulatory Visit: Payer: Self-pay

## 2018-09-14 ENCOUNTER — Encounter: Payer: Self-pay | Admitting: Podiatry

## 2018-09-14 ENCOUNTER — Ambulatory Visit (INDEPENDENT_AMBULATORY_CARE_PROVIDER_SITE_OTHER): Payer: Commercial Managed Care - PPO | Admitting: Podiatry

## 2018-09-14 ENCOUNTER — Other Ambulatory Visit: Payer: Self-pay | Admitting: Podiatry

## 2018-09-14 ENCOUNTER — Ambulatory Visit (INDEPENDENT_AMBULATORY_CARE_PROVIDER_SITE_OTHER): Payer: Commercial Managed Care - PPO

## 2018-09-14 VITALS — Temp 96.6°F | Resp 16

## 2018-09-14 DIAGNOSIS — M76822 Posterior tibial tendinitis, left leg: Secondary | ICD-10-CM

## 2018-09-14 DIAGNOSIS — M76821 Posterior tibial tendinitis, right leg: Secondary | ICD-10-CM | POA: Diagnosis not present

## 2018-09-14 DIAGNOSIS — Z9889 Other specified postprocedural states: Secondary | ICD-10-CM

## 2018-09-14 NOTE — Progress Notes (Signed)
Subjective:  Patient ID: Casey Lang, female    DOB: Jan 30, 1981,  MRN: [id number]  Chief Complaint  Patient presents with  . Routine Post Op    DOS 08/19/2018 AIKEN OSTEOTOMY, LAPIDUS W/ BUNIONECTOMY, METATARSAL OSTEOTOMY 2ND, HAMMER TOE REPAIR 2ND, AND KIDNER ADVANCED TENDON RT -Pt.s tates," doing okay, no pain ." Tx: crutches, boot, elevation and percocet (today for staple removal) -pt dneies pain/N/V/F/Ch     DOS: 07/22/2018 Procedure: Flatfoot Correction R with Double Calcaneal Osteotomy, Gastrocnemius Recession  DOS: 08/19/2018 Procedure: R Lapidus Bunionectomy, 2nd metatarsal osteotomy, 2nd hammertoe correction, navicular resection with PT tendon advancement.  38 y.o. female returns for post-op check. Doing well not having any pain. Maintaining NWB.  Review of Systems: Negative except as noted in the HPI. Denies N/V/F/Ch.  Past Medical History:  Diagnosis Date  . Anxiety   . Bipolar 1 disorder ([place])   . Depression   . Hypertension    pregnancy induced hypertension  . Mental disorder   . Right foot injury 10/09/2012   Resolved without complication    Current Outpatient Medications:  .  cephALEXin (KEFLEX) 500 MG capsule, Take 1 capsule (500 mg total) by mouth 2 (two) times daily., Disp: 14 capsule, Rfl: 0 .  clonazePAM (KLONOPIN) 0.5 MG tablet, Take 0.5 mg by mouth daily as needed., Disp: , Rfl: 0 .  enoxaparin (LOVENOX) 30 MG/0.3ML injection, Inject 0.3 mLs (30 mg total) into the skin every 12 (twelve) hours for 28 doses., Disp: 8.4 mL, Rfl: 0 .  gabapentin (NEURONTIN) 300 MG capsule, Take 300 mg by mouth at bedtime., Disp: , Rfl:  .  HYDROmorphone (DILAUDID) 2 MG tablet, Take 1 tablet (2 mg total) by mouth every 4 (four) hours as needed for severe pain., Disp: 20 tablet, Rfl: 0 .  lamoTRIgine (LAMICTAL) 150 MG tablet, Take 2 tablets (300 mg total) by mouth every evening., Disp: 60 tablet, Rfl: 0 .  oxyCODONE-acetaminophen (PERCOCET) 10-325 MG tablet, Take 1 tablet by  mouth every 4 (four) hours as needed for pain., Disp: 20 tablet, Rfl: 0 .  QUEtiapine (SEROQUEL) 300 MG tablet, Take 600 mg by mouth at bedtime., Disp: , Rfl:   Social History   Tobacco Use  Smoking Status Never Smoker  Smokeless Tobacco Never Used    Allergies  Allergen Reactions  . Morphine And Related Other (See Comments)    Reaction:  Psychosis    Objective:   Vitals:   09/14/18 0857  Resp: 16  Temp: (!) 96.6 F (35.9 C)   There is no height or weight on file to calculate BMI. Constitutional Well developed. Well nourished.  Vascular Foot warm and well perfused. Capillary refill normal to all digits.   Neurologic Normal speech. Oriented to person, place, and time. Epicritic sensation to light touch grossly present bilaterally.  Dermatologic Skin healing well without signs of infection. Skin edges well coapted without signs of infection.  Pin site intact without signs of infection  Orthopedic: Mild tenderness to palpation noted about the surgical site.  Reduced edema right forefoot. Hallux rectus, 2nd toe rectus, intact pin.    Radiographs: Taken and reviewed c/w post-op state arthrodesis 1st TMT with bridging no e/o hardware failure. Restoration of calcaneal inclination. Improved alignment. Assessment:   1. Posterior tibial tendinitis of left lower extremity   2. Post-operative state    Plan:  Patient was evaluated and treated and all questions answered.  S/p foot surgery right, now s/p both forefoot and rearfoot reconstruction. -Progressing as expected post-operatively. -XR:  taken and reviewed. -WB Status: NWB in crutches. -Sutures: removed all residual staples  -Foot redressed.    No follow-ups on file. XR at next visit. Possible pin removal

## 2018-09-17 ENCOUNTER — Other Ambulatory Visit: Payer: Self-pay | Admitting: Podiatry

## 2018-09-17 DIAGNOSIS — Z9889 Other specified postprocedural states: Secondary | ICD-10-CM

## 2018-09-17 DIAGNOSIS — M76822 Posterior tibial tendinitis, left leg: Secondary | ICD-10-CM

## 2018-09-29 ENCOUNTER — Encounter: Payer: Self-pay | Admitting: Podiatry

## 2018-09-29 ENCOUNTER — Other Ambulatory Visit: Payer: Self-pay | Admitting: Podiatry

## 2018-09-29 ENCOUNTER — Ambulatory Visit (INDEPENDENT_AMBULATORY_CARE_PROVIDER_SITE_OTHER): Payer: Commercial Managed Care - PPO

## 2018-09-29 ENCOUNTER — Ambulatory Visit (INDEPENDENT_AMBULATORY_CARE_PROVIDER_SITE_OTHER): Payer: Self-pay | Admitting: Podiatry

## 2018-09-29 ENCOUNTER — Other Ambulatory Visit: Payer: Self-pay

## 2018-09-29 VITALS — Resp 16

## 2018-09-29 DIAGNOSIS — Z9889 Other specified postprocedural states: Secondary | ICD-10-CM

## 2018-09-29 DIAGNOSIS — M76821 Posterior tibial tendinitis, right leg: Secondary | ICD-10-CM

## 2018-09-29 DIAGNOSIS — M76822 Posterior tibial tendinitis, left leg: Secondary | ICD-10-CM

## 2018-09-29 MED ORDER — OXYCODONE-ACETAMINOPHEN 5-325 MG PO TABS: 1.0000 | ORAL_TABLET | ORAL | 0 refills | Status: DC | PRN

## 2018-10-12 ENCOUNTER — Other Ambulatory Visit: Payer: Self-pay | Admitting: Podiatry

## 2018-10-12 DIAGNOSIS — Z9889 Other specified postprocedural states: Secondary | ICD-10-CM

## 2018-10-12 DIAGNOSIS — M76822 Posterior tibial tendinitis, left leg: Secondary | ICD-10-CM

## 2018-10-13 ENCOUNTER — Ambulatory Visit (INDEPENDENT_AMBULATORY_CARE_PROVIDER_SITE_OTHER): Payer: Self-pay | Admitting: Podiatry

## 2018-10-13 ENCOUNTER — Other Ambulatory Visit: Payer: Self-pay

## 2018-10-13 ENCOUNTER — Encounter: Payer: Self-pay | Admitting: Podiatry

## 2018-10-13 ENCOUNTER — Ambulatory Visit (INDEPENDENT_AMBULATORY_CARE_PROVIDER_SITE_OTHER): Payer: Commercial Managed Care - PPO

## 2018-10-13 ENCOUNTER — Other Ambulatory Visit: Payer: Self-pay | Admitting: Podiatry

## 2018-10-13 VITALS — Temp 97.1°F | Resp 16

## 2018-10-13 DIAGNOSIS — M21619 Bunion of unspecified foot: Secondary | ICD-10-CM

## 2018-10-13 DIAGNOSIS — Z9889 Other specified postprocedural states: Secondary | ICD-10-CM

## 2018-10-13 DIAGNOSIS — M21611 Bunion of right foot: Secondary | ICD-10-CM

## 2018-10-13 NOTE — Progress Notes (Signed)
  Subjective:  Patient ID: Casey Lang, female    DOB: September 10, 1980,  MRN: [id number]  Chief Complaint  Patient presents with  . Routine Post Op     DOS 08/19/2018 AIKEN OSTEOTOMY, LAPIDUS W/ BUNIONECTOMY, METATARSAL OSTEOTOMY 2ND, HAMMER TOE REPAIR 2ND, AND KIDNER ADVANCED TENDON RT -Pt. states," doing great, able to do my exercises, but a little stiff but no pian." Tx: boot and crutches -w/ less swelling -pt denies redness/wamrth -pt denies N/V/f/Ch    DOS: 07/22/2018 Procedure: Flatfoot Correction R with Double Calcaneal Osteotomy, Gastrocnemius Recession  DOS: 08/19/2018 Procedure: R Lapidus Bunionectomy, 2nd metatarsal osteotomy, 2nd hammertoe correction, navicular resection with PT tendon advancement.  38 y.o. female returns for post-op check. Hx as above.  Review of Systems: Negative except as noted in the HPI. Denies N/V/F/Ch.  Past Medical History:  Diagnosis Date  . Anxiety   . Bipolar 1 disorder ([place])   . Depression   . Hypertension    pregnancy induced hypertension  . Mental disorder   . Right foot injury 10/09/2012   Resolved without complication    Current Outpatient Medications:  .  cephALEXin (KEFLEX) 500 MG capsule, Take 1 capsule (500 mg total) by mouth 2 (two) times daily., Disp: 14 capsule, Rfl: 0 .  clonazePAM (KLONOPIN) 0.5 MG tablet, Take 0.5 mg by mouth daily as needed., Disp: , Rfl: 0 .  enoxaparin (LOVENOX) 30 MG/0.3ML injection, Inject 0.3 mLs (30 mg total) into the skin every 12 (twelve) hours for 28 doses., Disp: 8.4 mL, Rfl: 0 .  gabapentin (NEURONTIN) 300 MG capsule, Take 300 mg by mouth at bedtime., Disp: , Rfl:  .  HYDROmorphone (DILAUDID) 2 MG tablet, Take 1 tablet (2 mg total) by mouth every 4 (four) hours as needed for severe pain., Disp: 20 tablet, Rfl: 0 .  lamoTRIgine (LAMICTAL) 150 MG tablet, Take 2 tablets (300 mg total) by mouth every evening., Disp: 60 tablet, Rfl: 0 .  oxyCODONE-acetaminophen (PERCOCET) 5-325 MG tablet, Take 1 tablet  by mouth every 4 (four) hours as needed for severe pain., Disp: 20 tablet, Rfl: 0 .  QUEtiapine (SEROQUEL) 300 MG tablet, Take 600 mg by mouth at bedtime., Disp: , Rfl:   Social History   Tobacco Use  Smoking Status Never Smoker  Smokeless Tobacco Never Used    Allergies  Allergen Reactions  . Morphine And Related Other (See Comments)    Reaction:  Psychosis    Objective:   Vitals:   10/13/18 0914  Resp: 16  Temp: (!) 97.1 F (36.2 C)   There is no height or weight on file to calculate BMI. Constitutional Well developed. Well nourished.  Vascular Foot warm and well perfused. Capillary refill normal to all digits.   Neurologic Normal speech. Oriented to person, place, and time. Epicritic sensation to light touch grossly present bilaterally.  Dermatologic Skin well healed. Pin site intact without signs of infection  Orthopedic: Arch evident right foot. Mild pain to palpation at surgical areas. Hallux rectus, 2nd toe rectus, intact pin.    Radiographs: Taken and reviewed c/w post-op state arthrodesis healed. Hardware intact. Foot rectus. Assessment:   1. Post-operative state    Plan:  Patient was evaluated and treated and all questions answered.  S/p foot surgery right, now s/p both forefoot and rearfoot reconstruction. -Progressing as expected post-operatively. -XR: taken and reviewed. -WB Status: Start protected WB with PT. -Sutures: out -Continue PT. Start protected WB. Must be in CAM boot.  No follow-ups on file.

## 2018-10-13 NOTE — Progress Notes (Signed)
Subjective:  Patient ID: Casey Lang, female    DOB: 1980-06-14,  MRN: [id number]  Chief Complaint  Patient presents with  . Routine Post Op      DOS 08/19/2018 AIKEN OSTEOTOMY, LAPIDUS W/ BUNIONECTOMY, METATARSAL OSTEOTOMY 2ND, HAMMER TOE REPAIR 2ND, AND KIDNER ADVANCED TENDON RT -PT .st ates," dbeen doing good, no poain. Couple days ago I lost my balance going up the stairs and I landed on my foot." tx: boot and crutches -pt dneies N/V/F/Ch     DOS: 07/22/2018 Procedure: Flatfoot Correction R with Double Calcaneal Osteotomy, Gastrocnemius Recession  DOS: 08/19/2018 Procedure: R Lapidus Bunionectomy, 2nd metatarsal osteotomy, 2nd hammertoe correction, navicular resection with PT tendon advancement.  38 y.o. female returns for post-op check. Hx as above.  Review of Systems: Negative except as noted in the HPI. Denies N/V/F/Ch.  Past Medical History:  Diagnosis Date  . Anxiety   . Bipolar 1 disorder ([place])   . Depression   . Hypertension    pregnancy induced hypertension  . Mental disorder   . Right foot injury 10/09/2012   Resolved without complication    Current Outpatient Medications:  .  cephALEXin (KEFLEX) 500 MG capsule, Take 1 capsule (500 mg total) by mouth 2 (two) times daily., Disp: 14 capsule, Rfl: 0 .  clonazePAM (KLONOPIN) 0.5 MG tablet, Take 0.5 mg by mouth daily as needed., Disp: , Rfl: 0 .  enoxaparin (LOVENOX) 30 MG/0.3ML injection, Inject 0.3 mLs (30 mg total) into the skin every 12 (twelve) hours for 28 doses., Disp: 8.4 mL, Rfl: 0 .  gabapentin (NEURONTIN) 300 MG capsule, Take 300 mg by mouth at bedtime., Disp: , Rfl:  .  HYDROmorphone (DILAUDID) 2 MG tablet, Take 1 tablet (2 mg total) by mouth every 4 (four) hours as needed for severe pain., Disp: 20 tablet, Rfl: 0 .  lamoTRIgine (LAMICTAL) 150 MG tablet, Take 2 tablets (300 mg total) by mouth every evening., Disp: 60 tablet, Rfl: 0 .  oxyCODONE-acetaminophen (PERCOCET) 5-325 MG tablet, Take 1 tablet by  mouth every 4 (four) hours as needed for severe pain., Disp: 20 tablet, Rfl: 0 .  QUEtiapine (SEROQUEL) 300 MG tablet, Take 600 mg by mouth at bedtime., Disp: , Rfl:   Social History   Tobacco Use  Smoking Status Never Smoker  Smokeless Tobacco Never Used    Allergies  Allergen Reactions  . Morphine And Related Other (See Comments)    Reaction:  Psychosis    Objective:   Vitals:   09/29/18 1032  Resp: 16   There is no height or weight on file to calculate BMI. Constitutional Well developed. Well nourished.  Vascular Foot warm and well perfused. Capillary refill normal to all digits.   Neurologic Normal speech. Oriented to person, place, and time. Epicritic sensation to light touch grossly present bilaterally.  Dermatologic Skin well healed. Pin site intact without signs of infection  Orthopedic: Arch evident right foot. Mild pain to palpation at surgical areas. Hallux rectus, 2nd toe rectus, intact pin.    Radiographs: Taken and reviewed c/w post-op state arthrodesis healing well. Foot rectus. Assessment:   1. Post-operative state    Plan:  Patient was evaluated and treated and all questions answered.  S/p foot surgery right, now s/p both forefoot and rearfoot reconstruction. -Progressing as expected post-operatively. -XR: taken and reviewed. -WB Status: Start protected WB next week with PT. -Sutures: out -Pin pulled -Refer to PT. Start protected WB. Must be in CAM boot.  Return in about 2 weeks (  around 10/13/2018) for Post-op.

## 2018-11-03 ENCOUNTER — Ambulatory Visit (INDEPENDENT_AMBULATORY_CARE_PROVIDER_SITE_OTHER): Payer: Commercial Managed Care - PPO | Admitting: Podiatry

## 2018-11-03 ENCOUNTER — Other Ambulatory Visit: Payer: Self-pay

## 2018-11-03 ENCOUNTER — Ambulatory Visit (INDEPENDENT_AMBULATORY_CARE_PROVIDER_SITE_OTHER): Payer: Commercial Managed Care - PPO

## 2018-11-03 ENCOUNTER — Other Ambulatory Visit: Payer: Self-pay | Admitting: Podiatry

## 2018-11-03 DIAGNOSIS — Z9889 Other specified postprocedural states: Secondary | ICD-10-CM

## 2018-11-03 DIAGNOSIS — R600 Localized edema: Secondary | ICD-10-CM

## 2018-11-03 NOTE — Progress Notes (Signed)
Subjective:  Patient ID: Casey Lang, female    DOB: 10/10/1980,  MRN: [id number]  Chief Complaint  Patient presents with  . Routine Post Op    Pt. states," been having lots of pain in my ankle since 5 days ago. Pain is only at the outside of my ankle; 2-10/10 pain." -pt states she had severe pain at Lateral ankle Tx: PT, boot and crutches -pt dneies N/V/F/Ch     DOS: 07/22/2018 Procedure: Flatfoot Correction R with Double Calcaneal Osteotomy, Gastrocnemius Recession  DOS: 08/19/2018 Procedure: R Lapidus Bunionectomy, 2nd metatarsal osteotomy, 2nd hammertoe correction, navicular resection with PT tendon advancement.  38 y.o. female returns for post-op check. Hx as above. Having pain and swelling to the outside of the right foot. Has been weightbearing with crutch assitance.  Review of Systems: Negative except as noted in the HPI. Denies N/V/F/Ch.  Past Medical History:  Diagnosis Date  . Anxiety   . Bipolar 1 disorder ([place])   . Depression   . Hypertension    pregnancy induced hypertension  . Mental disorder   . Right foot injury 10/09/2012   Resolved without complication    Current Outpatient Medications:  .  cephALEXin (KEFLEX) 500 MG capsule, Take 1 capsule (500 mg total) by mouth 2 (two) times daily., Disp: 14 capsule, Rfl: 0 .  clonazePAM (KLONOPIN) 0.5 MG tablet, Take 0.5 mg by mouth daily as needed., Disp: , Rfl: 0 .  enoxaparin (LOVENOX) 30 MG/0.3ML injection, Inject 0.3 mLs (30 mg total) into the skin every 12 (twelve) hours for 28 doses., Disp: 8.4 mL, Rfl: 0 .  gabapentin (NEURONTIN) 300 MG capsule, Take 300 mg by mouth at bedtime., Disp: , Rfl:  .  HYDROmorphone (DILAUDID) 2 MG tablet, Take 1 tablet (2 mg total) by mouth every 4 (four) hours as needed for severe pain., Disp: 20 tablet, Rfl: 0 .  lamoTRIgine (LAMICTAL) 150 MG tablet, Take 2 tablets (300 mg total) by mouth every evening., Disp: 60 tablet, Rfl: 0 .  oxyCODONE-acetaminophen (PERCOCET) 5-325 MG tablet,  Take 1 tablet by mouth every 4 (four) hours as needed for severe pain., Disp: 20 tablet, Rfl: 0 .  QUEtiapine (SEROQUEL) 300 MG tablet, Take 600 mg by mouth at bedtime., Disp: , Rfl:   Social History   Tobacco Use  Smoking Status Never Smoker  Smokeless Tobacco Never Used    Allergies  Allergen Reactions  . Morphine And Related Other (See Comments)    Reaction:  Psychosis    Objective:   There were no vitals filed for this visit. There is no height or weight on file to calculate BMI. Constitutional Well developed. Well nourished.  Vascular Foot warm and well perfused. Capillary refill normal to all digits.   Neurologic Normal speech. Oriented to person, place, and time. Epicritic sensation to light touch grossly present bilaterally.  Dermatologic Skin well healed. Pin site intact without signs of infection  Orthopedic: Arch evident right foot. Mild pain to palpation at lateral calcaneus. Hallux rectus, 2nd toe rectus, intact pin.  Edema right foot.   Radiographs: Taken and reviewed c/w post-op state arthrodesis sites appear healed, no HWF, no arch maintained. Assessment:   1. Localized edema    Plan:  Patient was evaluated and treated and all questions answered.  S/p foot surgery right, now s/p both forefoot and rearfoot reconstruction. -Progressing as expected post-operatively. -XR: taken and reviewed. -WB Status: Continue protected WB in CAM -Continue PT.  -Unna boot applied for reduction of edema. -No XR  next visit. -Plan for CMO casting next visit if edema reduced. Plan to transition to sneaker and ankle brace if pain improved and tolerating ambulating in CAM boot -Will extend out of work status.  Return in about 2 weeks (around 11/17/2018) for Post-op.

## 2018-11-11 ENCOUNTER — Other Ambulatory Visit: Payer: Self-pay | Admitting: Podiatry

## 2018-11-11 DIAGNOSIS — Z9889 Other specified postprocedural states: Secondary | ICD-10-CM

## 2018-11-11 DIAGNOSIS — R600 Localized edema: Secondary | ICD-10-CM

## 2018-11-17 ENCOUNTER — Other Ambulatory Visit: Payer: Self-pay

## 2018-11-17 ENCOUNTER — Ambulatory Visit (INDEPENDENT_AMBULATORY_CARE_PROVIDER_SITE_OTHER): Payer: Commercial Managed Care - PPO | Admitting: Podiatry

## 2018-11-17 DIAGNOSIS — M76822 Posterior tibial tendinitis, left leg: Secondary | ICD-10-CM

## 2018-11-17 DIAGNOSIS — M76821 Posterior tibial tendinitis, right leg: Secondary | ICD-10-CM

## 2018-11-17 DIAGNOSIS — M76829 Posterior tibial tendinitis, unspecified leg: Secondary | ICD-10-CM

## 2018-11-17 DIAGNOSIS — Z9889 Other specified postprocedural states: Secondary | ICD-10-CM

## 2018-12-16 ENCOUNTER — Other Ambulatory Visit: Payer: Self-pay

## 2018-12-16 ENCOUNTER — Ambulatory Visit (INDEPENDENT_AMBULATORY_CARE_PROVIDER_SITE_OTHER): Payer: Commercial Managed Care - PPO | Admitting: Podiatry

## 2018-12-16 DIAGNOSIS — Z9889 Other specified postprocedural states: Secondary | ICD-10-CM

## 2018-12-16 DIAGNOSIS — M76829 Posterior tibial tendinitis, unspecified leg: Secondary | ICD-10-CM

## 2018-12-16 DIAGNOSIS — M76822 Posterior tibial tendinitis, left leg: Secondary | ICD-10-CM

## 2018-12-16 NOTE — Patient Instructions (Signed)

## 2018-12-17 ENCOUNTER — Other Ambulatory Visit: Payer: Commercial Managed Care - PPO

## 2018-12-21 ENCOUNTER — Other Ambulatory Visit: Payer: Self-pay

## 2018-12-21 ENCOUNTER — Ambulatory Visit (INDEPENDENT_AMBULATORY_CARE_PROVIDER_SITE_OTHER): Payer: Commercial Managed Care - PPO | Admitting: Podiatry

## 2018-12-21 DIAGNOSIS — R600 Localized edema: Secondary | ICD-10-CM | POA: Diagnosis not present

## 2018-12-21 DIAGNOSIS — M76822 Posterior tibial tendinitis, left leg: Secondary | ICD-10-CM | POA: Diagnosis not present

## 2018-12-21 DIAGNOSIS — M21611 Bunion of right foot: Secondary | ICD-10-CM

## 2018-12-21 DIAGNOSIS — M2011 Hallux valgus (acquired), right foot: Secondary | ICD-10-CM | POA: Diagnosis not present

## 2018-12-21 DIAGNOSIS — M2041 Other hammer toe(s) (acquired), right foot: Secondary | ICD-10-CM

## 2018-12-21 DIAGNOSIS — M2141 Flat foot [pes planus] (acquired), right foot: Secondary | ICD-10-CM

## 2018-12-21 NOTE — Progress Notes (Signed)
Subjective:  Patient ID: Casey Lang, female    DOB: Mar 16, 1980,  MRN: [id number]  Chief Complaint  Patient presents with  . Routine Post Op    Pt. states," it's doing okay, with some pain at the arch where it used to be before  surgery (8/10 sharp occasional pians)." -w/ swwelling -pt staets she noticed that pain after doing a certain stretching excercise at PT Tx: compresison sock and PT    DOS: 07/22/2018 Procedure: Flatfoot Correction R with Double Calcaneal Osteotomy, Gastrocnemius Recession  DOS: 08/19/2018 Procedure: R Lapidus Bunionectomy, 2nd metatarsal osteotomy, 2nd hammertoe correction, navicular resection with PT tendon advancement.  38 y.o. female returns for post-op check. Hx as above.   Review of Systems: Negative except as noted in the HPI. Denies N/V/F/Ch.  Past Medical History:  Diagnosis Date  . Anxiety   . Bipolar 1 disorder ([place])   . Depression   . Hypertension    pregnancy induced hypertension  . Mental disorder   . Right foot injury 10/09/2012   Resolved without complication    Current Outpatient Medications:  .  cephALEXin (KEFLEX) 500 MG capsule, Take 1 capsule (500 mg total) by mouth 2 (two) times daily., Disp: 14 capsule, Rfl: 0 .  clonazePAM (KLONOPIN) 0.5 MG tablet, Take 0.5 mg by mouth daily as needed., Disp: , Rfl: 0 .  enoxaparin (LOVENOX) 30 MG/0.3ML injection, Inject 0.3 mLs (30 mg total) into the skin every 12 (twelve) hours for 28 doses., Disp: 8.4 mL, Rfl: 0 .  gabapentin (NEURONTIN) 300 MG capsule, Take 300 mg by mouth at bedtime., Disp: , Rfl:  .  HYDROmorphone (DILAUDID) 2 MG tablet, Take 1 tablet (2 mg total) by mouth every 4 (four) hours as needed for severe pain., Disp: 20 tablet, Rfl: 0 .  lamoTRIgine (LAMICTAL) 150 MG tablet, Take 2 tablets (300 mg total) by mouth every evening., Disp: 60 tablet, Rfl: 0 .  oxyCODONE-acetaminophen (PERCOCET) 5-325 MG tablet, Take 1 tablet by mouth every 4 (four) hours as needed for severe pain.,  Disp: 20 tablet, Rfl: 0 .  QUEtiapine (SEROQUEL) 300 MG tablet, Take 600 mg by mouth at bedtime., Disp: , Rfl:   Social History   Tobacco Use  Smoking Status Never Smoker  Smokeless Tobacco Never Used    Allergies  Allergen Reactions  . Morphine And Related Other (See Comments)    Reaction:  Psychosis    Objective:   There were no vitals filed for this visit. There is no height or weight on file to calculate BMI. Constitutional Well developed. Well nourished.  Vascular Foot warm and well perfused. Capillary refill normal to all digits.   Neurologic Normal speech. Oriented to person, place, and time. Epicritic sensation to light touch grossly present bilaterally.  Dermatologic Skin well healed.   Orthopedic: Arch evident right foot. Mild pain to palpation at lateral calcaneus. Hallux rectus, 2nd toe rectus. Edema right foot.   Radiographs: None Assessment:   1. Posterior tibial tendinitis of left lower extremity   2. Localized edema   3. Hallux valgus with bunions of right foot   4. Hammer toe of right foot   5. Acquired pes planovalgus of right foot    Plan:  Patient was evaluated and treated and all questions answered.  S/p foot surgery right, now s/p both forefoot and rearfoot reconstruction. -Overall doing well.  She is ambulating but she still has some occasional severe sharp pain in the arch.  I think this will resolve with time and  will continue use of orthotics.  I think she is ready to go back to work next week with some limitations and then in a few weeks should be ready for her full 12-hour shifts.  Her foot structure has held up well and she has a nice arch of the foot.  Continue compression sock for edema follow-up in 6 weeks for recheck  No follow-ups on file.

## 2018-12-22 ENCOUNTER — Encounter: Payer: Self-pay | Admitting: Podiatry

## 2018-12-23 ENCOUNTER — Encounter: Payer: Self-pay | Admitting: Podiatry

## 2018-12-24 ENCOUNTER — Telehealth: Payer: Self-pay | Admitting: *Deleted

## 2018-12-24 NOTE — Telephone Encounter (Signed)
Patient called and said that her job would not except the letter written for return back to work with restrictions and would like another letter stating that she may return back to work without restrictions  Please call to let her know message was received.  Tried to call patient  back, no answer,left VM message that Dr. March Rummage said it was ok to write another letter.

## 2018-12-25 NOTE — Telephone Encounter (Signed)
Ok to write updated note.

## 2018-12-28 NOTE — Progress Notes (Signed)
Patient ID: Casey Lang, female   DOB: [birth year], 38 y.o.   MRN: [id number] Patient presents for orthotic pick up.  Verbal and written break in and wear instructions given.  Patient will follow up in 4 weeks with Dr March Rummage if symptoms worsen or fail to improve.

## 2019-01-11 ENCOUNTER — Other Ambulatory Visit: Payer: Self-pay | Admitting: Podiatry

## 2019-01-11 MED ORDER — MELOXICAM 15 MG PO TABS: 15.0000 mg | ORAL_TABLET | Freq: Every day | ORAL | 0 refills | Status: DC

## 2019-01-11 NOTE — Progress Notes (Signed)
Called and spoke to patient it sounds like she is having pain along the PTT with a lot of activity. Start Meloxicam, move up appt in 2 weeks for check.

## 2019-01-12 ENCOUNTER — Other Ambulatory Visit: Payer: Self-pay

## 2019-01-12 ENCOUNTER — Encounter: Payer: Self-pay | Admitting: Obstetrics

## 2019-01-12 ENCOUNTER — Ambulatory Visit (INDEPENDENT_AMBULATORY_CARE_PROVIDER_SITE_OTHER): Payer: Commercial Managed Care - PPO | Admitting: Obstetrics

## 2019-01-12 VITALS — BP 130/92 | HR 84 | Ht 68.0 in | Wt 248.0 lb

## 2019-01-12 DIAGNOSIS — Z1151 Encounter for screening for human papillomavirus (HPV): Secondary | ICD-10-CM

## 2019-01-12 DIAGNOSIS — Z124 Encounter for screening for malignant neoplasm of cervix: Secondary | ICD-10-CM | POA: Diagnosis not present

## 2019-01-12 DIAGNOSIS — N898 Other specified noninflammatory disorders of vagina: Secondary | ICD-10-CM | POA: Diagnosis not present

## 2019-01-12 DIAGNOSIS — Z1239 Encounter for other screening for malignant neoplasm of breast: Secondary | ICD-10-CM

## 2019-01-12 DIAGNOSIS — B9689 Other specified bacterial agents as the cause of diseases classified elsewhere: Secondary | ICD-10-CM

## 2019-01-12 DIAGNOSIS — Z113 Encounter for screening for infections with a predominantly sexual mode of transmission: Secondary | ICD-10-CM | POA: Diagnosis not present

## 2019-01-12 DIAGNOSIS — Z01419 Encounter for gynecological examination (general) (routine) without abnormal findings: Secondary | ICD-10-CM

## 2019-01-12 DIAGNOSIS — E669 Obesity, unspecified: Secondary | ICD-10-CM

## 2019-01-12 DIAGNOSIS — N760 Acute vaginitis: Secondary | ICD-10-CM

## 2019-01-12 DIAGNOSIS — R1033 Periumbilical pain: Secondary | ICD-10-CM

## 2019-01-12 DIAGNOSIS — R87612 Low grade squamous intraepithelial lesion on cytologic smear of cervix (LGSIL): Secondary | ICD-10-CM

## 2019-01-12 NOTE — Progress Notes (Signed)
Subjective:        Casey Lang is a 38 y.o. female here for a routine exam.  Current complaints: None.    Personal health questionnaire:  Is patient Ashkenazi Jewish, have a family history of breast and/or ovarian cancer: no Is there a family history of uterine cancer diagnosed at age < 24, gastrointestinal cancer, urinary tract cancer, family member who is a Field seismologist syndrome-associated carrier: no Is the patient overweight and hypertensive, family history of diabetes, personal history of gestational diabetes, preeclampsia or PCOS: yes Is patient over 33, have PCOS,  family history of premature CHD under age 37, diabetes, smoke, have hypertension or peripheral artery disease:  no At any time, has a partner hit, kicked or otherwise hurt or frightened you?: no Over the past 2 weeks, have you felt down, depressed or hopeless?: no Over the past 2 weeks, have you felt little interest or pleasure in doing things?:no   Gynecologic History Patient's last menstrual period was 12/27/2018 (approximate). Contraception: tubal ligation Last Pap: 10-09-2016. Results were: LGSIL.   Colposcopic Biopsies on 10-31-2016:  No Dysplasia Last mammogram: None. Results were: None  Obstetric History OB History  Gravida Para Term Preterm AB Living  2 1 1   1 1   SAB TAB Ectopic Multiple Live Births        0 1    # Outcome Date GA Lbr Len/2nd Weight Sex Delivery Anes PTL Lv  2 Term 07/18/16 [redacted]w[redacted]d 01:28 / 00:50 6 lb 12.5 oz (3.076 kg) M Vag-Spont EPI, Local  LIV  1 AB 2010            Past Medical History:  Diagnosis Date  . Anxiety   . Bipolar 1 disorder ([place])   . Depression   . Hypertension    pregnancy induced hypertension  . Mental disorder   . Right foot injury 10/09/2012   Resolved without complication    Past Surgical History:  Procedure Laterality Date  . FOOT SURGERY    . HEMATOMA EVACUATION N/A 07/18/2016   Procedure: EVACUATION HEMATOMA;  Surgeon: Donnamae Jude, MD;  Location: [facility];  Service: Gynecology;  Laterality: N/A;  . LAPAROSCOPIC GASTRIC SLEEVE RESECTION  10/09/2012   Surgery was on 09/30/2011  . TUBAL LIGATION Bilateral 07/18/2016   Procedure: POST PARTUM TUBAL LIGATION;  Surgeon: Donnamae Jude, MD;  Location: [facility];  Service: Gynecology;  Laterality: Bilateral;     Current Outpatient Medications:  .  clonazePAM (KLONOPIN) 0.5 MG tablet, Take 0.5 mg by mouth daily as needed., Disp: , Rfl: 0 .  gabapentin (NEURONTIN) 300 MG capsule, Take 300 mg by mouth at bedtime., Disp: , Rfl:  .  lamoTRIgine (LAMICTAL) 150 MG tablet, Take 2 tablets (300 mg total) by mouth every evening., Disp: 60 tablet, Rfl: 0 .  meloxicam (MOBIC) 15 MG tablet, Take 1 tablet (15 mg total) by mouth daily., Disp: 30 tablet, Rfl: 0 .  Multiple Vitamin (MULTIVITAMIN) tablet, Take 1 tablet by mouth daily., Disp: , Rfl:  .  oxyCODONE-acetaminophen (PERCOCET) 5-325 MG tablet, Take 1 tablet by mouth every 4 (four) hours as needed for severe pain., Disp: 20 tablet, Rfl: 0 .  QUEtiapine (SEROQUEL) 300 MG tablet, Take 600 mg by mouth at bedtime., Disp: , Rfl:  .  cephALEXin (KEFLEX) 500 MG capsule, Take 1 capsule (500 mg total) by mouth 2 (two) times daily. (Patient not taking: Reported on 01/12/2019), Disp: 14 capsule, Rfl: 0 .  enoxaparin (LOVENOX) 30 MG/0.3ML  injection, Inject 0.3 mLs (30 mg total) into the skin every 12 (twelve) hours for 28 doses., Disp: 8.4 mL, Rfl: 0 .  HYDROmorphone (DILAUDID) 2 MG tablet, Take 1 tablet (2 mg total) by mouth every 4 (four) hours as needed for severe pain. (Patient not taking: Reported on 01/12/2019), Disp: 20 tablet, Rfl: 0 Allergies  Allergen Reactions  . Morphine And Related Other (See Comments)    Reaction:  Psychosis     Social History   Tobacco Use  . Smoking status: Never Smoker  . Smokeless tobacco: Never Used  Substance Use Topics  . Alcohol use: No    Family History  Problem Relation Age of Onset  . Hypertension Mother    . Diabetes Mother   . Diabetes Father   . Hypertension Father   . Anxiety disorder Other   . Depression Other   . Aneurysm Maternal Grandmother   . Alzheimer's disease Paternal Grandmother   . Cancer Paternal Grandmother   . Seizures Neg Hx       Review of Systems  Constitutional: negative for fatigue and weight loss Respiratory: negative for cough and wheezing Cardiovascular: negative for chest pain, fatigue and palpitations Gastrointestinal: negative for abdominal pain and change in bowel habits Musculoskeletal:negative for myalgias Neurological: negative for gait problems and tremors Behavioral/Psych: negative for abusive relationship, depression Endocrine: negative for temperature intolerance    Genitourinary:negative for abnormal menstrual periods, genital lesions, hot flashes, sexual problems and vaginal discharge Integument/breast: negative for breast lump, breast tenderness, nipple discharge and skin lesion(s)    Objective:       BP (!) 130/92   Pulse 84   Ht 5\' 8"  (1.727 m)   Wt 248 lb (112.5 kg)   LMP 12/27/2018 (Approximate)   BMI 37.71 kg/m  General:   alert  Skin:   no rash or abnormalities  Lungs:   clear to auscultation bilaterally  Heart:   regular rate and rhythm, S1, S2 normal, no murmur, click, rub or gallop  Breasts:   normal without suspicious masses, skin or nipple changes or axillary nodes  Abdomen:  normal findings: no organomegaly, soft, non-tender and no hernia  Pelvis:  External genitalia: normal general appearance Urinary system: urethral meatus normal and bladder without fullness, nontender Vaginal: normal without tenderness, induration or masses Cervix: normal appearance Adnexa: normal bimanual exam Uterus: anteverted and non-tender, normal size   Lab Review Urine pregnancy test Labs reviewed yes Radiologic studies reviewed no  50% of 25 min visit spent on counseling and coordination of care.   Assessment:     1. Encounter for  routine gynecological examination with Papanicolaou smear of cervix Rx: - Cytology - PAP( [facility])  2. Vaginal discharge - Cervicovaginal Ancillary only  3. Obesity (BMI 35.0-39.9 without comorbidity) - program of caloric restriction, exercise and behavioral modification recommended  4. Screening breast examination Rx: - MM Digital Screening; Future  5. LGSIL on Pap smear of cervix.  Negative Colpo. - repeat pap in 1 year if NILM today   Plan:    Education reviewed: calcium supplements, depression evaluation, low fat, low cholesterol diet, safe sex/STD prevention, self breast exams and weight bearing exercise. Mammogram ordered. Follow up in: 1 year.   No orders of the defined types were placed in this encounter.  Orders Placed This Encounter  Procedures  . MM Digital Screening    Standing Status:   Future    Standing Expiration Date:   03/14/2020    Order Specific Question:  Reason for Exam (SYMPTOM  OR DIAGNOSIS REQUIRED)    Answer:   Screening    Order Specific Question:   Is the patient pregnant?    Answer:   No    Order Specific Question:   Preferred imaging location?    Answer:   [facility]    Shelly Bombard, MD 01/12/2019 12:27 PM

## 2019-01-12 NOTE — Progress Notes (Signed)
Patient presents for Annual Exam   Last pap:10/09/2016 Abnormal LSIL CIN1/HPV LMP: 12/27/18 STD Screening: Declined  Contraception: BTL  Mammogram : Never  Family Hx of Breast Cancer: PGM   CC: None

## 2019-01-14 ENCOUNTER — Other Ambulatory Visit: Payer: Self-pay | Admitting: Obstetrics

## 2019-01-14 DIAGNOSIS — B373 Candidiasis of vulva and vagina: Secondary | ICD-10-CM

## 2019-01-14 DIAGNOSIS — B3731 Acute candidiasis of vulva and vagina: Secondary | ICD-10-CM

## 2019-01-14 DIAGNOSIS — B9689 Other specified bacterial agents as the cause of diseases classified elsewhere: Secondary | ICD-10-CM

## 2019-01-14 LAB — CYTOLOGY - PAP
Comment: NEGATIVE
Diagnosis: NEGATIVE
High risk HPV: NEGATIVE

## 2019-01-14 LAB — CERVICOVAGINAL ANCILLARY ONLY
Bacterial Vaginitis (gardnerella): POSITIVE — AB
Candida Glabrata: NEGATIVE
Candida Vaginitis: POSITIVE — AB
Comment: NEGATIVE
Comment: NEGATIVE
Comment: NEGATIVE
Comment: NEGATIVE
Trichomonas: NEGATIVE

## 2019-01-14 MED ORDER — TINIDAZOLE 500 MG PO TABS: 1000.0000 mg | ORAL_TABLET | Freq: Every day | ORAL | 2 refills | Status: DC

## 2019-01-14 MED ORDER — FLUCONAZOLE 150 MG PO TABS: 150.0000 mg | ORAL_TABLET | Freq: Once | ORAL | 0 refills | Status: AC

## 2019-01-15 ENCOUNTER — Encounter: Payer: Self-pay | Admitting: Podiatry

## 2019-01-24 NOTE — Progress Notes (Signed)
Subjective:  Patient ID: Casey Lang, female    DOB: November 25, 1980,  MRN: [id number]  Chief Complaint  Patient presents with  . Routine Post Op    Pt. states," been doing okay, still some pain at the lateral side of my anke and swlling; 4/10 pain." -w/ swelling -pt denies redness/warmth -pt states she can't even walk more than 10 min Tx: boot and PT    DOS: 07/22/2018 Procedure: Flatfoot Correction R with Double Calcaneal Osteotomy, Gastrocnemius Recession  DOS: 08/19/2018 Procedure: R Lapidus Bunionectomy, 2nd metatarsal osteotomy, 2nd hammertoe correction, navicular resection with PT tendon advancement.  38 y.o. female returns for post-op check. Hx as above.   Review of Systems: Negative except as noted in the HPI. Denies N/V/F/Ch.  Past Medical History:  Diagnosis Date  . Anxiety   . Bipolar 1 disorder ([place])   . Depression   . Hypertension    pregnancy induced hypertension  . Mental disorder   . Right foot injury 10/09/2012   Resolved without complication    Current Outpatient Medications:  .  cephALEXin (KEFLEX) 500 MG capsule, Take 1 capsule (500 mg total) by mouth 2 (two) times daily. (Patient not taking: Reported on 01/12/2019), Disp: 14 capsule, Rfl: 0 .  clonazePAM (KLONOPIN) 0.5 MG tablet, Take 0.5 mg by mouth daily as needed., Disp: , Rfl: 0 .  enoxaparin (LOVENOX) 30 MG/0.3ML injection, Inject 0.3 mLs (30 mg total) into the skin every 12 (twelve) hours for 28 doses., Disp: 8.4 mL, Rfl: 0 .  gabapentin (NEURONTIN) 300 MG capsule, Take 300 mg by mouth at bedtime., Disp: , Rfl:  .  HYDROmorphone (DILAUDID) 2 MG tablet, Take 1 tablet (2 mg total) by mouth every 4 (four) hours as needed for severe pain. (Patient not taking: Reported on 01/12/2019), Disp: 20 tablet, Rfl: 0 .  lamoTRIgine (LAMICTAL) 150 MG tablet, Take 2 tablets (300 mg total) by mouth every evening., Disp: 60 tablet, Rfl: 0 .  meloxicam (MOBIC) 15 MG tablet, Take 1 tablet (15 mg total) by mouth daily.,  Disp: 90 tablet, Rfl: 1 .  Multiple Vitamin (MULTIVITAMIN) tablet, Take 1 tablet by mouth daily., Disp: , Rfl:  .  oxyCODONE-acetaminophen (PERCOCET) 5-325 MG tablet, Take 1 tablet by mouth every 4 (four) hours as needed for severe pain., Disp: 20 tablet, Rfl: 0 .  QUEtiapine (SEROQUEL) 300 MG tablet, Take 600 mg by mouth at bedtime., Disp: , Rfl:  .  tinidazole (TINDAMAX) 500 MG tablet, Take 2 tablets (1,000 mg total) by mouth daily with breakfast., Disp: 10 tablet, Rfl: 2  Social History   Tobacco Use  Smoking Status Never Smoker  Smokeless Tobacco Never Used    Allergies  Allergen Reactions  . Morphine And Related Other (See Comments)    Reaction:  Psychosis    Objective:   There were no vitals filed for this visit. There is no height or weight on file to calculate BMI. Constitutional Well developed. Well nourished.  Vascular Foot warm and well perfused. Capillary refill normal to all digits.   Neurologic Normal speech. Oriented to person, place, and time. Epicritic sensation to light touch grossly present bilaterally.  Dermatologic Skin well healed. Pin site intact without signs of infection  Orthopedic: Arch evident right foot. Mild pain to palpation at lateral calcaneus. Hallux rectus, 2nd toe rectus, intact pin.  Edema right foot.   Radiographs: Taken and reviewed c/w post-op state arthrodesis sites appear healed, no HWF, no arch maintained. Assessment:   1. Post-operative state  2. Posterior tibial tendinitis of left lower extremity   3. PTTD (posterior tibial tendon dysfunction)    Plan:  Patient was evaluated and treated and all questions answered.  S/p foot surgery right, now s/p both forefoot and rearfoot reconstruction. -Dispensed Tri-Lock brace.  Casted for CMO's today.  We will plan for slow transition to weightbearing and then plan for return to work  No follow-ups on file.

## 2019-01-26 ENCOUNTER — Ambulatory Visit (INDEPENDENT_AMBULATORY_CARE_PROVIDER_SITE_OTHER): Payer: Commercial Managed Care - PPO | Admitting: Podiatry

## 2019-01-26 ENCOUNTER — Other Ambulatory Visit: Payer: Self-pay

## 2019-01-26 DIAGNOSIS — R600 Localized edema: Secondary | ICD-10-CM

## 2019-01-26 DIAGNOSIS — M2011 Hallux valgus (acquired), right foot: Secondary | ICD-10-CM | POA: Diagnosis not present

## 2019-01-26 DIAGNOSIS — M76822 Posterior tibial tendinitis, left leg: Secondary | ICD-10-CM | POA: Diagnosis not present

## 2019-01-26 DIAGNOSIS — M722 Plantar fascial fibromatosis: Secondary | ICD-10-CM | POA: Diagnosis not present

## 2019-01-26 DIAGNOSIS — M2041 Other hammer toe(s) (acquired), right foot: Secondary | ICD-10-CM | POA: Diagnosis not present

## 2019-01-26 DIAGNOSIS — M21611 Bunion of right foot: Secondary | ICD-10-CM

## 2019-01-26 DIAGNOSIS — M2141 Flat foot [pes planus] (acquired), right foot: Secondary | ICD-10-CM

## 2019-01-26 MED ORDER — MELOXICAM 15 MG PO TABS: 15.0000 mg | ORAL_TABLET | Freq: Every day | ORAL | 1 refills | Status: DC

## 2019-02-08 ENCOUNTER — Encounter: Payer: Commercial Managed Care - PPO | Admitting: Podiatry

## 2019-02-11 ENCOUNTER — Telehealth: Payer: Self-pay | Admitting: Podiatry

## 2019-02-11 NOTE — Telephone Encounter (Signed)
Dr. March Rummage I have paperwork to complete for patient for Long term Disability claim. Please advice as what to do. She is scheduled for a return visit on 02/16/2019.

## 2019-02-14 NOTE — Progress Notes (Signed)
Subjective:  Patient ID: Casey Lang, female    DOB: 03-11-1980,  MRN: [id number]  Chief Complaint  Patient presents with  . Routine Post Op    POV Pt.states," sinice put on mobic, it's better, but at times I have moderatie pains'; 6/10 occasional pains -worse wti walking -pt states pain was severe w/o mobic Tx; mobic -w/ swellgin     DOS: 07/22/2018 Procedure: Flatfoot Correction R with Double Calcaneal Osteotomy, Gastrocnemius Recession  DOS: 08/19/2018 Procedure: R Lapidus Bunionectomy, 2nd metatarsal osteotomy, 2nd hammertoe correction, navicular resection with PT tendon advancement.  39 y.o. female returns for post-op check. Hx as above.   Review of Systems: Negative except as noted in the HPI. Denies N/V/F/Ch.  Past Medical History:  Diagnosis Date  . Anxiety   . Bipolar 1 disorder ([place])   . Depression   . Hypertension    pregnancy induced hypertension  . Mental disorder   . Right foot injury 10/09/2012   Resolved without complication    Current Outpatient Medications:  .  cephALEXin (KEFLEX) 500 MG capsule, Take 1 capsule (500 mg total) by mouth 2 (two) times daily. (Patient not taking: Reported on 01/12/2019), Disp: 14 capsule, Rfl: 0 .  clonazePAM (KLONOPIN) 0.5 MG tablet, Take 0.5 mg by mouth daily as needed., Disp: , Rfl: 0 .  enoxaparin (LOVENOX) 30 MG/0.3ML injection, Inject 0.3 mLs (30 mg total) into the skin every 12 (twelve) hours for 28 doses., Disp: 8.4 mL, Rfl: 0 .  gabapentin (NEURONTIN) 300 MG capsule, Take 300 mg by mouth at bedtime., Disp: , Rfl:  .  HYDROmorphone (DILAUDID) 2 MG tablet, Take 1 tablet (2 mg total) by mouth every 4 (four) hours as needed for severe pain. (Patient not taking: Reported on 01/12/2019), Disp: 20 tablet, Rfl: 0 .  lamoTRIgine (LAMICTAL) 150 MG tablet, Take 2 tablets (300 mg total) by mouth every evening., Disp: 60 tablet, Rfl: 0 .  meloxicam (MOBIC) 15 MG tablet, Take 1 tablet (15 mg total) by mouth daily., Disp: 90 tablet,  Rfl: 1 .  Multiple Vitamin (MULTIVITAMIN) tablet, Take 1 tablet by mouth daily., Disp: , Rfl:  .  oxyCODONE-acetaminophen (PERCOCET) 5-325 MG tablet, Take 1 tablet by mouth every 4 (four) hours as needed for severe pain., Disp: 20 tablet, Rfl: 0 .  QUEtiapine (SEROQUEL) 300 MG tablet, Take 600 mg by mouth at bedtime., Disp: , Rfl:  .  tinidazole (TINDAMAX) 500 MG tablet, Take 2 tablets (1,000 mg total) by mouth daily with breakfast., Disp: 10 tablet, Rfl: 2  Social History   Tobacco Use  Smoking Status Never Smoker  Smokeless Tobacco Never Used    Allergies  Allergen Reactions  . Morphine And Related Other (See Comments)    Reaction:  Psychosis    Objective:   There were no vitals filed for this visit. There is no height or weight on file to calculate BMI. Constitutional Well developed. Well nourished.  Vascular Foot warm and well perfused. Capillary refill normal to all digits.   Neurologic Normal speech. Oriented to person, place, and time. Epicritic sensation to light touch grossly present bilaterally.  Dermatologic Skin well healed.   Orthopedic: Arch evident right foot. Mild pain to palpation at lateral calcaneus. Hallux rectus, 2nd toe rectus. Edema right foot.   Radiographs: None Assessment:   1. Posterior tibial tendinitis of left lower extremity   2. Localized edema   3. Hallux valgus with bunions of right foot   4. Hammer toe of right foot  5. Acquired pes planovalgus of right foot    Plan:  Patient was evaluated and treated and all questions answered.  S/p foot surgery right, now s/p both forefoot and rearfoot reconstruction. -Overall doing well.  Unfortunately she has lost her job from extended being off from work.  She does not have the stamina to work a full day shift given that her job is rather demanding.  I think she should continue with physical therapy.  Her foot structure is maintained but she is having some pain in the arch.  We dispensed a plantar  fascial brace today to help with some of this pain.  I want her to continue to work with therapy and continue to work on her stamina.  We will write her out for disability given the pain and inability to do her job.  We will follow-up in several weeks to ensure that she is doing better.  Refill of meloxicam today  Return in about 3 weeks (around 02/16/2019) for Tendonitis, Right.

## 2019-02-16 ENCOUNTER — Other Ambulatory Visit: Payer: Self-pay

## 2019-02-16 ENCOUNTER — Ambulatory Visit (INDEPENDENT_AMBULATORY_CARE_PROVIDER_SITE_OTHER): Payer: Self-pay | Admitting: Podiatry

## 2019-02-16 DIAGNOSIS — Z9889 Other specified postprocedural states: Secondary | ICD-10-CM

## 2019-02-16 DIAGNOSIS — M2011 Hallux valgus (acquired), right foot: Secondary | ICD-10-CM

## 2019-02-16 DIAGNOSIS — M76822 Posterior tibial tendinitis, left leg: Secondary | ICD-10-CM

## 2019-02-16 DIAGNOSIS — R600 Localized edema: Secondary | ICD-10-CM

## 2019-02-16 DIAGNOSIS — M21611 Bunion of right foot: Secondary | ICD-10-CM

## 2019-02-16 DIAGNOSIS — M2041 Other hammer toe(s) (acquired), right foot: Secondary | ICD-10-CM

## 2019-02-16 NOTE — Progress Notes (Signed)
  Subjective:  Patient ID: Casey Lang, female    DOB: [birth year],  MRN: [id number]  Chief Complaint  Patient presents with  . Tendonitis    F/U Rt tendonitis Pt. states," it's betting in my tendon but 2 days ago I did a lot of work around the house and after an hour my ankle was very swollen and I was limping; 8/10 shapr pains Tx: mobic, tylenol and resting     39 y.o. female presents with the above complaint. History confirmed with patient.   Objective:  Physical Exam:  Right Foot: Right foot overall good alignment with restoration of the arch no pain at the navicular pain palpation about the ATFL   Assessment:   1. Posterior tibial tendinitis of left lower extremity   2. Post-operative state   3. Localized edema   4. Hallux valgus with bunions of right foot   5. Hammer toe of right foot      Plan:  Patient was evaluated and treated and all questions answered.  PTTD, Pes Planus s/p Reconstruction -Overall she is improving but she still having some pain and is not back to full activity.  I estimate her return to work to be between March and April.  I think her most recent bout of pain will subside with time.  I recommended she use an ankle brace for the time being.  Follow-up in 1 month for recheck.  Advised her to continue meloxicam use the steroid pack as necessary.  Return in about 4 weeks (around 03/16/2019).   MDM

## 2019-03-08 ENCOUNTER — Encounter: Payer: Self-pay | Admitting: Podiatry

## 2019-03-16 ENCOUNTER — Ambulatory Visit: Payer: Self-pay | Admitting: Podiatry

## 2019-03-23 ENCOUNTER — Other Ambulatory Visit: Payer: Self-pay

## 2019-03-23 ENCOUNTER — Ambulatory Visit: Payer: Medicaid Other | Admitting: Podiatry

## 2019-03-23 DIAGNOSIS — M2041 Other hammer toe(s) (acquired), right foot: Secondary | ICD-10-CM

## 2019-03-23 DIAGNOSIS — M2011 Hallux valgus (acquired), right foot: Secondary | ICD-10-CM | POA: Diagnosis not present

## 2019-03-23 DIAGNOSIS — M21611 Bunion of right foot: Secondary | ICD-10-CM

## 2019-03-23 DIAGNOSIS — M76822 Posterior tibial tendinitis, left leg: Secondary | ICD-10-CM

## 2019-03-23 DIAGNOSIS — R600 Localized edema: Secondary | ICD-10-CM

## 2019-03-23 DIAGNOSIS — Q742 Other congenital malformations of lower limb(s), including pelvic girdle: Secondary | ICD-10-CM

## 2019-03-23 DIAGNOSIS — M2141 Flat foot [pes planus] (acquired), right foot: Secondary | ICD-10-CM

## 2019-03-23 NOTE — Progress Notes (Signed)
  Subjective:  Patient ID: Casey Lang, female    DOB: [birth year],  MRN: [id number]  Chief Complaint  Patient presents with  . Tendonitis    F/U Rt tendonitis Pt. states," feeling a lot better than before, if any pain it will be a 3/10." tx: tylenol, and mobic -w/ occaswional swellgin     39 y.o. female presents with the above complaint. History confirmed with patient.   Objective:  Physical Exam:  Right Foot: Right foot overall good alignment with restoration of the arch no pain at the navicular or at the ATFL.   Assessment:   1. Posterior tibial tendinitis of left lower extremity   2. Localized edema   3. Hallux valgus with bunions of right foot   4. Hammer toe of right foot   5. Accessory navicular bone of right foot   6. Acquired pes planovalgus of right foot    Plan:  Patient was evaluated and treated and all questions answered.  PTTD, Pes Planus s/p Reconstruction -She continues to improve. She has minimal pain. Continue Meloxicam. Will have her f/u in a few months to make sure everything is doing well. Otherwise she has no restrictions and can continue to work to find employment. I do think she would benefit from a job that does not involve as much walking or 12 hour shifts as her previous high demand job.  Return in about 3 months (around 06/20/2019) for Pes planus f/u .

## 2019-03-31 DIAGNOSIS — M79676 Pain in unspecified toe(s): Secondary | ICD-10-CM

## 2019-06-21 ENCOUNTER — Ambulatory Visit (INDEPENDENT_AMBULATORY_CARE_PROVIDER_SITE_OTHER): Payer: Medicaid Other | Admitting: Podiatry

## 2019-06-21 ENCOUNTER — Other Ambulatory Visit: Payer: Self-pay

## 2019-06-21 DIAGNOSIS — Q742 Other congenital malformations of lower limb(s), including pelvic girdle: Secondary | ICD-10-CM

## 2019-06-21 DIAGNOSIS — M76822 Posterior tibial tendinitis, left leg: Secondary | ICD-10-CM

## 2019-06-21 DIAGNOSIS — M2041 Other hammer toe(s) (acquired), right foot: Secondary | ICD-10-CM | POA: Diagnosis not present

## 2019-06-21 DIAGNOSIS — M21611 Bunion of right foot: Secondary | ICD-10-CM

## 2019-06-21 DIAGNOSIS — M2011 Hallux valgus (acquired), right foot: Secondary | ICD-10-CM | POA: Diagnosis not present

## 2019-06-21 DIAGNOSIS — R600 Localized edema: Secondary | ICD-10-CM

## 2019-06-21 NOTE — Progress Notes (Signed)
  Subjective:  Patient ID: Casey Lang, female    DOB: 07-17-1980,  MRN: [id number]  Chief Complaint  Patient presents with  . pes planus    F/U BL pes planus and Rt tendonitis pt. states," both feet are great, no pain, but still with some swelling." tx: meloxicam     39 y.o. female presents with the above complaint. History confirmed with patient.   Objective:  Physical Exam: warm, good capillary refill, no trophic changes or ulcerative lesions, normal DP and PT pulses and normal sensory exam. Right Foot: normal exam, no swelling, tenderness, instability; ligaments intact, full range of motion of all ankle/foot joints. No pain at the PT tendon or navicular.   Assessment:   1. Posterior tibial tendinitis of left lower extremity   2. Hallux valgus with bunions of right foot   3. Hammer toe of right foot   4. Localized edema   5. Accessory navicular bone of right foot      Plan:  Patient was evaluated and treated and all questions answered.  Pes Planus -Improving, no pain. She is pleased with results of surgery. -Continue CMOs -Continue compression socks. -F/u PRN should issues persist.  No follow-ups on file.

## 2020-04-27 ENCOUNTER — Ambulatory Visit (INDEPENDENT_AMBULATORY_CARE_PROVIDER_SITE_OTHER): Payer: Medicaid Other | Admitting: Podiatry

## 2020-04-27 ENCOUNTER — Ambulatory Visit (INDEPENDENT_AMBULATORY_CARE_PROVIDER_SITE_OTHER): Payer: Medicaid Other

## 2020-04-27 ENCOUNTER — Other Ambulatory Visit: Payer: Self-pay

## 2020-04-27 DIAGNOSIS — M76822 Posterior tibial tendinitis, left leg: Secondary | ICD-10-CM | POA: Diagnosis not present

## 2020-04-27 DIAGNOSIS — M2041 Other hammer toe(s) (acquired), right foot: Secondary | ICD-10-CM

## 2020-04-27 DIAGNOSIS — M2011 Hallux valgus (acquired), right foot: Secondary | ICD-10-CM | POA: Diagnosis not present

## 2020-04-27 DIAGNOSIS — M79676 Pain in unspecified toe(s): Secondary | ICD-10-CM

## 2020-04-27 DIAGNOSIS — M21619 Bunion of unspecified foot: Secondary | ICD-10-CM

## 2020-04-27 DIAGNOSIS — M21611 Bunion of right foot: Secondary | ICD-10-CM | POA: Diagnosis not present

## 2020-04-27 NOTE — Progress Notes (Signed)
  Subjective:  Patient ID: Casey Lang, female    DOB: [birth year],  MRN: [id number]  Chief Complaint  Patient presents with  . Follow-up    [place] with toe curving again after surgery- mentioned there is some slight pain    40 y.o. female presents with the above complaint. History confirmed with patient.   Objective:  Physical Exam: warm, good capillary refill, no trophic changes or ulcerative lesions, normal DP and PT pulses and normal sensory exam. Right Foot: Mild recurrence of flatfoot deformity. Pain at Navicular with some prominence   Assessment:   1. Bunion   2. Posterior tibial tendinitis of left lower extremity   3. Hallux valgus with bunions of right foot   4. Hammer toe of right foot    Plan:  Patient was evaluated and treated and all questions answered.  Pes Planus -She does appear to have worsening of the deformity. She has a mild medial exostosis. Would consider redoing her CMOs with higher heel cup. Advised of non-coverage. ABN filled out, patient given copy. Will schedule for casting next week.  Return in about 6 weeks (around 06/08/2020) for Flatfoot f/u.

## 2020-05-01 ENCOUNTER — Other Ambulatory Visit: Payer: Self-pay

## 2020-05-01 ENCOUNTER — Ambulatory Visit (INDEPENDENT_AMBULATORY_CARE_PROVIDER_SITE_OTHER): Payer: Medicaid Other | Admitting: Podiatry

## 2020-05-01 DIAGNOSIS — M2041 Other hammer toe(s) (acquired), right foot: Secondary | ICD-10-CM

## 2020-05-01 DIAGNOSIS — M21611 Bunion of right foot: Secondary | ICD-10-CM

## 2020-05-01 DIAGNOSIS — M2011 Hallux valgus (acquired), right foot: Secondary | ICD-10-CM

## 2020-05-01 NOTE — Progress Notes (Signed)
Patient presents to be casted for orthotics.  A foam impression was casted for her right and left foot.  Patient is a size 9 wide.  Patient will be contacted when the orthotics are ready for pick up.

## 2020-06-08 ENCOUNTER — Encounter: Payer: Self-pay | Admitting: Podiatry

## 2020-06-08 ENCOUNTER — Other Ambulatory Visit: Payer: Self-pay

## 2020-06-08 ENCOUNTER — Ambulatory Visit (INDEPENDENT_AMBULATORY_CARE_PROVIDER_SITE_OTHER): Payer: Medicaid Other | Admitting: Podiatry

## 2020-06-08 DIAGNOSIS — M2011 Hallux valgus (acquired), right foot: Secondary | ICD-10-CM

## 2020-06-08 NOTE — Progress Notes (Signed)
  Subjective:  Patient ID: Casey Lang, female    DOB: 1980-11-09,  MRN: [id number]  Chief Complaint  Patient presents with  . Foot Problem    I am here to get the inserts    41 y.o. female presents with the above complaint. History confirmed with patient.   Objective:  Physical Exam: warm, good capillary refill, no trophic changes or ulcerative lesions, normal DP and PT pulses and normal sensory exam. Right Foot: Mild recurrence of flatfoot deformity. Pain at Navicular with some prominence   Assessment:   1. Hallux valgus with bunions of right foot    Plan:  Patient was evaluated and treated and all questions answered.  Pes Planus -Orthotics were dispensed today. She was educated on use. She does have some pain at the navicular bone. We did discuss that should the orthotics not help we can grind this down at the navicular. Otherwise I think she will do well. F/u in 6 weeks  Return in about 6 weeks (around 07/20/2020).

## 2020-06-08 NOTE — Patient Instructions (Signed)

## 2020-07-20 ENCOUNTER — Other Ambulatory Visit: Payer: Self-pay

## 2020-07-20 ENCOUNTER — Ambulatory Visit: Payer: Medicaid Other | Admitting: Podiatry

## 2020-07-20 DIAGNOSIS — M2041 Other hammer toe(s) (acquired), right foot: Secondary | ICD-10-CM | POA: Diagnosis not present

## 2020-07-20 DIAGNOSIS — M21611 Bunion of right foot: Secondary | ICD-10-CM

## 2020-07-20 DIAGNOSIS — M21619 Bunion of unspecified foot: Secondary | ICD-10-CM

## 2020-07-20 DIAGNOSIS — M2011 Hallux valgus (acquired), right foot: Secondary | ICD-10-CM

## 2020-07-20 DIAGNOSIS — M76822 Posterior tibial tendinitis, left leg: Secondary | ICD-10-CM

## 2020-07-20 NOTE — Progress Notes (Signed)
  Subjective:  Patient ID: Casey Lang, female    DOB: [birth year],  MRN: [id number]  No chief complaint on file.  40 y.o. female presents for follow up. Thinks that the feet are doing much better doing well with the orthotics and not having issues at present.  Objective:  Physical Exam: warm, good capillary refill, no trophic changes or ulcerative lesions, normal DP and PT pulses and normal sensory exam. Right Foot: Mild recurrence of flatfoot deformity. No pain at Navicular but with  some prominence   Assessment:   1. Hallux valgus with bunions of right foot   2. Hammer toe of right foot   3. Bunion   4. Posterior tibial tendinitis of left lower extremity    Plan:  Patient was evaluated and treated and all questions answered.  Pes Planus -Improving, controlled with CMOs. Patient to follow up if issues persist -We did discuss possible need for navicular exostectomy in the future. Could also consider skiving out CMO for the navicular.   Return if symptoms worsen or fail to improve.

## 2020-10-18 ENCOUNTER — Telehealth: Payer: Self-pay | Admitting: Podiatry

## 2020-10-18 NOTE — Telephone Encounter (Signed)
Pt req refill on meloxicam [facility]  has f/u appt 10-23-20

## 2020-10-19 MED ORDER — MELOXICAM 15 MG PO TABS: 15.0000 mg | ORAL_TABLET | Freq: Every day | ORAL | 1 refills | Status: DC

## 2020-10-19 NOTE — Telephone Encounter (Signed)
Pt notified/reb °

## 2020-10-19 NOTE — Addendum Note (Signed)
Addended by: Hardie Pulley on: 10/19/2020 08:25 AM   Modules accepted: Orders

## 2020-10-23 ENCOUNTER — Ambulatory Visit: Payer: Medicaid Other | Admitting: Podiatry

## 2020-10-23 ENCOUNTER — Other Ambulatory Visit: Payer: Self-pay

## 2020-10-23 DIAGNOSIS — M76822 Posterior tibial tendinitis, left leg: Secondary | ICD-10-CM

## 2020-10-23 DIAGNOSIS — M7662 Achilles tendinitis, left leg: Secondary | ICD-10-CM

## 2020-10-23 DIAGNOSIS — R600 Localized edema: Secondary | ICD-10-CM

## 2020-10-23 NOTE — Progress Notes (Signed)
  Subjective:  Patient ID: Rhianne Fradette, female    DOB: May 07, 1980,  MRN: [id number]  Chief Complaint  Patient presents with   Pain    Re-occurrence pain at Rt medial arch x 1 1/2 wk - pt taking meloxicam better with pain and swelling tx: walking   40 y.o. female presents for follow up. States she has had a mild flare up of right foot pain at the arch. Has been taking the meloxicam and states it helps.  Objective:  Physical Exam: warm, good capillary refill, no trophic changes or ulcerative lesions, normal DP and PT pulses and normal sensory exam. Right Foot: Mild recurrence of flatfoot deformity. No pain at Navicular but with  some prominence   Assessment:   1. Posterior tibial tendinitis of left lower extremity   2. Localized edema     Plan:  Patient was evaluated and treated and all questions answered.  Pes Planus -Offload with ASO brace. Rx written for hanger. Patient to wear for 2 weeks.  -Continue Meloxicam for 3-4 weeks. -Should pain persist consider navicular exostectomy.  No follow-ups on file.

## 2021-04-16 ENCOUNTER — Ambulatory Visit: Payer: Medicaid Other | Admitting: Podiatry

## 2021-06-11 ENCOUNTER — Other Ambulatory Visit: Payer: Self-pay | Admitting: Hematology and Oncology

## 2021-06-11 ENCOUNTER — Inpatient Hospital Stay: Payer: Medicaid Other

## 2021-06-11 ENCOUNTER — Inpatient Hospital Stay: Payer: Medicaid Other | Attending: Hematology and Oncology | Admitting: Hematology and Oncology

## 2021-06-11 ENCOUNTER — Encounter: Payer: Self-pay | Admitting: Hematology and Oncology

## 2021-06-11 VITALS — BP 125/88 | HR 77 | Temp 98.0°F | Resp 18 | Ht 66.0 in | Wt 227.1 lb

## 2021-06-11 DIAGNOSIS — D709 Neutropenia, unspecified: Secondary | ICD-10-CM | POA: Diagnosis present

## 2021-06-11 DIAGNOSIS — D649 Anemia, unspecified: Secondary | ICD-10-CM

## 2021-06-11 DIAGNOSIS — D500 Iron deficiency anemia secondary to blood loss (chronic): Secondary | ICD-10-CM | POA: Insufficient documentation

## 2021-06-11 DIAGNOSIS — D509 Iron deficiency anemia, unspecified: Secondary | ICD-10-CM | POA: Diagnosis present

## 2021-06-11 LAB — IRON AND TIBC
Iron: 124 ug/dL (ref 28–170)
Saturation Ratios: 29 % (ref 10.4–31.8)
TIBC: 426 ug/dL (ref 250–450)
UIBC: 302 ug/dL

## 2021-06-11 LAB — COMPREHENSIVE METABOLIC PANEL
Albumin: 4.4 (ref 3.5–5.0)
Calcium: 9.3 (ref 8.7–10.7)

## 2021-06-11 LAB — TSH: TSH: 0.954 u[IU]/mL (ref 0.350–4.500)

## 2021-06-11 LAB — BASIC METABOLIC PANEL
BUN: 15 (ref 4–21)
CO2: 27 — AB (ref 13–22)
Chloride: 104 (ref 99–108)
Creatinine: 1 (ref 0.5–1.1)
Glucose: 74
Potassium: 4.1 mEq/L (ref 3.5–5.1)
Sodium: 141 (ref 137–147)

## 2021-06-11 LAB — CBC
MCV: 91 (ref 81–99)
RBC: 3.9 (ref 3.87–5.11)

## 2021-06-11 LAB — FOLATE: Folate: >40 ng/mL (ref >5.9)

## 2021-06-11 LAB — CBC AND DIFFERENTIAL
HCT: 36 (ref 36–46)
Hemoglobin: 11.3 — AB (ref 12.0–16.0)
Neutrophils Absolute: 0.94
Platelets: 261 10*3/uL (ref 150–400)
WBC: 2.3

## 2021-06-11 LAB — HEPATIC FUNCTION PANEL
ALT: 11 U/L (ref 7–35)
AST: 19 (ref 13–35)
Alkaline Phosphatase: 55 (ref 25–125)
Bilirubin, Total: 0.8

## 2021-06-11 LAB — FERRITIN: Ferritin: 8 ng/mL — ABNORMAL LOW (ref 11–307)

## 2021-06-11 LAB — VITAMIN B12: Vitamin B-12: 607 pg/mL (ref 180–914)

## 2021-06-11 LAB — LACTATE DEHYDROGENASE: LDH: 105 U/L (ref 98–192)

## 2021-06-11 NOTE — Progress Notes (Cosign Needed)
?[facility]  ?[address] ?[place],  [place]  [address] ?[phone] ? ?Clinic Day:  06/11/2021 ? ?Referring physician: Jeanie Sewer, NP ? ? ?REASON FOR CONSULTATION:  ?Abnormal CBC ? ?HISTORY OF PRESENT ILLNESS:  ?Casey Lang is a 41 y.o. female with a history of iron deficiency since 2020, with apparently new finding of neutropenia, who is referred in consultation by Jeanie Sewer, NP for assessment and management.  At her primary care office on April 21st, white blood cell count was 2.6 with an ANC of 1.0, hemoglobin 11 with an MCV of 91 and platelets were normal.  Iron studies revealed serum iron 43, TIBC 370, iron saturation 12% and ferritin 15 consistent with iron deficiency.  The patient states she has been taking a chewable multivitamin daily.  She resumed an oral iron supplement daily about 2 weeks ago.  She states she recalls being told that her white blood cell count was low at one time.  She has a history of severe anemia after the birth of her son.  Her hemoglobin dropped to 6.6 due to post delivery bleeding. She was transfused 2 units of packed red blood cells at that time.  Review of [facility] medical [facility] at [facility], revealed she had mild leukopenia in 2012 with WBC 3.6 and ANC 1.3, as well as in 2017 with WBC 3.4 and ANC 1.4.  She has regular menses every 4 weeks with about 7 days of bleeding.  She states the bleeding is heavy for 2 to 3 days.  She denies melena or hematochezia.  Of note, she had a gastric sleeve placed in 2014 and lost 100 pounds. ? ?REVIEW OF SYSTEMS:  ?Review of Systems  ?Constitutional:  Negative for appetite change, chills, fatigue, fever and unexpected weight change.  ?HENT:   Negative for lump/mass, mouth sores and sore throat.   ?Respiratory:  Negative for cough and shortness of breath.   ?Cardiovascular:  Negative for chest pain and leg swelling.  ?Gastrointestinal:  Negative for abdominal pain, constipation,  diarrhea, nausea and vomiting.  ?Endocrine: Negative for hot flashes.  ?Genitourinary:  Negative for difficulty urinating, dysuria, frequency and hematuria.   ?Musculoskeletal:  Negative for arthralgias, back pain and myalgias.  ?Skin:  Negative for rash.  ?Neurological:  Negative for dizziness and headaches.  ?Hematological:  Negative for adenopathy. Does not bruise/bleed easily.  ?Psychiatric/Behavioral:  Negative for depression and sleep disturbance. The patient is not nervous/anxious.    ? ?VITALS:  ?Blood pressure 125/88, pulse 77, temperature 98 ?F (36.7 ?C), temperature source Oral, resp. rate 18, height '5\' 6"'$  (1.676 m), weight 227 lb 1.6 oz (103 kg), SpO2 99 %.  ?Wt Readings from Last 3 Encounters:  ?06/11/21 227 lb 1.6 oz (103 kg)  ?01/12/19 248 lb (112.5 kg)  ?09/29/17 255 lb (115.7 kg)  ?  ?Body mass index is 36.65 kg/m?. ? ?Performance status (ECOG): 0 - Asymptomatic ? ?PHYSICAL EXAM:  ?Physical Exam ?Vitals and nursing note reviewed.  ?Constitutional:   ?   General: She is not in acute distress. ?   Appearance: Normal appearance.  ?HENT:  ?   Head: Normocephalic and atraumatic.  ?   Mouth/Throat:  ?   Mouth: Mucous membranes are moist.  ?   Pharynx: Oropharynx is clear. No oropharyngeal exudate or posterior oropharyngeal erythema.  ?Eyes:  ?   General: No scleral icterus. ?   Extraocular Movements: Extraocular movements intact.  ?   Conjunctiva/sclera: Conjunctivae normal.  ?  Pupils: Pupils are equal, round, and reactive to light.  ?Cardiovascular:  ?   Rate and Rhythm: Normal rate and regular rhythm.  ?   Heart sounds: Normal heart sounds. No murmur heard. ?  No friction rub. No gallop.  ?Pulmonary:  ?   Effort: Pulmonary effort is normal.  ?   Breath sounds: Normal breath sounds. No wheezing, rhonchi or rales.  ?Abdominal:  ?   General: There is no distension.  ?   Palpations: Abdomen is soft. There is no hepatomegaly, splenomegaly or mass.  ?   Tenderness: There is no abdominal tenderness.   ?Musculoskeletal:     ?   General: Normal range of motion.  ?   Cervical back: Normal range of motion and neck supple. No tenderness.  ?   Right lower leg: No edema.  ?   Left lower leg: No edema.  ?Lymphadenopathy:  ?   Cervical: No cervical adenopathy.  ?   Upper Body:  ?   Right upper body: No supraclavicular or axillary adenopathy.  ?   Left upper body: No supraclavicular or axillary adenopathy.  ?   Lower Body: No right inguinal adenopathy. No left inguinal adenopathy.  ?Skin: ?   General: Skin is warm and dry.  ?   Coloration: Skin is not jaundiced.  ?   Findings: No rash.  ?Neurological:  ?   Mental Status: She is alert and oriented to person, place, and time.  ?   Cranial Nerves: No cranial nerve deficit.  ?Psychiatric:     ?   Mood and Affect: Mood normal.     ?   Behavior: Behavior normal.     ?   Thought Content: Thought content normal.  ? ? ? ?LABS:  ? ? ?  Latest Ref Rng & Units 06/11/2021  ? 12:00 AM 08/03/2018  ? 12:09 PM 07/21/2016  ?  5:07 AM  ?CBC  ?WBC  2.3      5.4   5.9    ?Hemoglobin 12.0 - 16.0 11.3      11.2   8.0    ?Hematocrit 36 - 46 36      33.1   23.3    ?Platelets 150 - 400 K/uL 261      499   209    ?  ? This result is from an external source.  ? ? ?  Latest Ref Rng & Units 07/17/2016  ?  7:46 AM 06/30/2016  ?  3:21 PM 05/03/2016  ?  5:48 PM  ?CMP  ?Glucose 65 - 99 mg/dL 110   91   81    ?BUN 6 - 20 mg/dL '16   15   14    '$ ?Creatinine 0.44 - 1.00 mg/dL 0.83   0.78   0.72    ?Sodium 135 - 145 mmol/L 135   136   135    ?Potassium 3.5 - 5.1 mmol/L 4.5   4.4   3.9    ?Chloride 101 - 111 mmol/L 106   107   102    ?CO2 22 - 32 mmol/L '22   23   24    '$ ?Calcium 8.9 - 10.3 mg/dL 8.8   8.8   8.7    ?Total Protein 6.5 - 8.1 g/dL 6.7   6.3   6.3    ?Total Bilirubin 0.3 - 1.2 mg/dL 0.5   0.3   0.5    ?Alkaline Phos 38 - 126 U/L 78   62  41    ?AST 15 - 41 U/L '22   13   13    '$ ?ALT 14 - 54 U/L '10   11   9    '$ ? ? ? ?No results found for: CEA1 / No results found for: CEA1 ?No results found for: PSA1 ?No results  found for: UJW119 ?No results found for: JYN829  ?No results found for: TOTALPROTELP, ALBUMINELP, A1GS, A2GS, BETS, BETA2SER, GAMS, MSPIKE, SPEI ?No results found for: TIBC, FERRITIN, IRONPCTSAT ?No results found for: LDH ? ?STUDIES:  ?No results found.  ? ? ?HISTORY:  ? ?Past Medical History:  ?Diagnosis Date  ? Anxiety   ? Bipolar 1 disorder ([place])   ? 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 Depression   ? Gestational hypertension   ? Hypertension   ? pregnancy induced hypertension  ? Mental disorder   ? PTSD (post-traumatic stress disorder)   ? Right foot injury 10/09/2012  ? Resolved without complication  ? ? ?Past Surgical History:  ?Procedure Laterality Date  ? FOOT SURGERY    ? HEMATOMA EVACUATION N/A 07/18/2016  ? Procedure: EVACUATION HEMATOMA;  Surgeon: Donnamae Jude, MD;  Location: [facility];  Service: Gynecology;  Laterality: N/A;  ? LAPAROSCOPIC GASTRIC SLEEVE RESECTION  10/09/2012  ? Surgery was on 09/30/2011  ? TUBAL LIGATION Bilateral 07/18/2016  ? Procedure: POST PARTUM TUBAL LIGATION;  Surgeon: Donnamae Jude, MD;  Location: [facility];  Service: Gynecology;  Laterality: Bilateral;  ? ? ?Family History  ?Problem Relation Age of Onset  ? Hypertension Mother   ? Diabetes Mother   ? Hyperlipidemia Mother   ? Diabetes Father   ? Hypertension Father   ? Asthma Brother   ? Hypertension Maternal Grandmother   ? Stroke Maternal Grandmother   ? Aneurysm Maternal Grandmother   ? Arthritis Maternal Grandmother   ? Heart attack Maternal Grandmother   ? Hypercholesterolemia Maternal Grandmother   ? Cancer Paternal Grandmother   ? Alzheimer's disease Paternal Grandmother   ? Breast cancer Paternal Grandmother   ? Anxiety disorder Other   ? Depression Other   ? Seizures Neg Hx   ? ? ?Social History:  reports that she has never smoked. She has never used smokeless tobacco. She reports that she does not drink alcohol and does not use drugs.  She was born in [place] but has  been in [place] since age 67.  She is single.  She has 1 son who will be 24 years old in June, she lives with.  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  She is eating a healthy diet and swimming 5 t

## 2021-07-17 ENCOUNTER — Ambulatory Visit (INDEPENDENT_AMBULATORY_CARE_PROVIDER_SITE_OTHER): Payer: Commercial Managed Care - PPO | Admitting: Obstetrics

## 2021-07-17 ENCOUNTER — Encounter: Payer: Self-pay | Admitting: Obstetrics

## 2021-07-17 ENCOUNTER — Other Ambulatory Visit (HOSPITAL_COMMUNITY)
Admission: RE | Admit: 2021-07-17 | Discharge: 2021-07-17 | Disposition: A | Discharge status: Home / Self Care | Payer: Medicaid Other | Source: Ambulatory Visit | Attending: Obstetrics | Admitting: Obstetrics

## 2021-07-17 VITALS — BP 119/83 | HR 77 | Ht 66.0 in | Wt 229.6 lb

## 2021-07-17 DIAGNOSIS — I10 Essential (primary) hypertension: Secondary | ICD-10-CM

## 2021-07-17 DIAGNOSIS — Z6837 Body mass index (BMI) 37.0-37.9, adult: Secondary | ICD-10-CM

## 2021-07-17 DIAGNOSIS — N898 Other specified noninflammatory disorders of vagina: Secondary | ICD-10-CM | POA: Diagnosis not present

## 2021-07-17 DIAGNOSIS — Z01419 Encounter for gynecological examination (general) (routine) without abnormal findings: Secondary | ICD-10-CM

## 2021-07-17 DIAGNOSIS — Z1239 Encounter for other screening for malignant neoplasm of breast: Secondary | ICD-10-CM

## 2021-07-17 DIAGNOSIS — Z113 Encounter for screening for infections with a predominantly sexual mode of transmission: Secondary | ICD-10-CM | POA: Diagnosis not present

## 2021-07-17 DIAGNOSIS — E669 Obesity, unspecified: Secondary | ICD-10-CM

## 2021-07-17 NOTE — Progress Notes (Signed)
Patient presents for AEX. Patient does not want to have STD testing, yeast, or bv, Denies having any vaginal concerns.  Last Pap 01/12/2019 Normal Has not had a mammogram yet.

## 2021-07-17 NOTE — Progress Notes (Signed)
Subjective:        Casey Lang is a 41 y.o. female here for a routine exam.  Current complaints: None.    Personal health questionnaire:  Is patient Ashkenazi Jewish, have a family history of breast and/or ovarian cancer: no Is there a family history of uterine cancer diagnosed at age < 35, gastrointestinal cancer, urinary tract cancer, family member who is a Field seismologist syndrome-associated carrier: no Is the patient overweight and hypertensive, family history of diabetes, personal history of gestational diabetes, preeclampsia or PCOS: yes Is patient over 46, have PCOS,  family history of premature CHD under age 49, diabetes, smoke, have hypertension or peripheral artery disease:  no At any time, has a partner hit, kicked or otherwise hurt or frightened you?: no Over the past 2 weeks, have you felt down, depressed or hopeless?: no Over the past 2 weeks, have you felt little interest or pleasure in doing things?:no   Gynecologic History Patient's last menstrual period was 07/02/2021. Contraception: tubal ligation Last Pap: 2020. Results were: normal Last mammogram: none. 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   Depression    Gestational hypertension    Hypertension    pregnancy induced hypertension   Mental disorder    PTSD (post-traumatic stress disorder)    Right foot injury 10/09/2012   Resolved without complication    Past Surgical History:  Procedure Laterality Date   FOOT SURGERY     HEMATOMA EVACUATION N/A 07/18/2016   Procedure: EVACUATION  HEMATOMA;  Surgeon: PDonnamae Jude MD;  Location: [facility]  Service: Gynecology;  Laterality: N/A;   LAPAROSCOPIC GASTRIC SLEEVE RESECTION  10/09/2012   Surgery was on 09/30/2011   TUBAL LIGATION Bilateral 07/18/2016   Procedure: POST PARTUM TUBAL LIGATION;  Surgeon: PDonnamae Jude MD;  Location: [facility]  Service: Gynecology;  Laterality: Bilateral;     Current Outpatient Medications:    ferrous sulfate 325 (65 FE) MG tablet, Take 325 mg by mouth daily with breakfast., Disp: , Rfl:    gabapentin (NEURONTIN) 300 MG capsule, Take 300 mg by mouth at bedtime., Disp: , Rfl:    lamoTRIgine (LAMICTAL) 150 MG tablet, Take 2 tablets (300 mg total) by mouth every evening., Disp: 60 tablet, Rfl: 0   Multiple Vitamins-Minerals (MULTI-VITAMIN GUMMIES PO), , Disp: , Rfl:    QUEtiapine (SEROQUEL) 300 MG tablet, Take 100 mg by mouth at bedtime. Taking '100mg'$ , Disp: , Rfl:    tinidazole (TINDAMAX) 500 MG tablet, Take 2 tablets (1,000 mg total) by mouth daily with breakfast., Disp: 10 tablet, Rfl: 2 Allergies  Allergen Reactions   Morphine And Related Other (See Comments)    Reaction:  Psychosis     Social History   Tobacco Use   Smoking status: Never   Smokeless tobacco: Never  Substance Use Topics   Alcohol use:  No    Family History  Problem Relation Age of Onset   Hypertension Mother    Diabetes Mother    Hyperlipidemia Mother    Diabetes Father    Hypertension Father    Asthma Brother    Hypertension Maternal Grandmother    Stroke Maternal Grandmother    Aneurysm Maternal Grandmother    Arthritis Maternal Grandmother    Heart attack Maternal Grandmother    Hypercholesterolemia Maternal Grandmother    Cancer Paternal Grandmother    Alzheimer's disease Paternal Grandmother    Breast cancer Paternal Grandmother    Anxiety disorder Other    Depression Other    Seizures Neg Hx       Review of Systems  Constitutional: negative for fatigue and weight  loss Respiratory: negative for cough and wheezing Cardiovascular: negative for chest pain, fatigue and palpitations Gastrointestinal: negative for abdominal pain and change in bowel habits Musculoskeletal:negative for myalgias Neurological: negative for gait problems and tremors Behavioral/Psych: negative for abusive relationship, depression Endocrine: negative for temperature intolerance    Genitourinary: positive for vaginal discharge.  negative for abnormal menstrual periods, genital lesions, hot flashes, sexual problems Integument/breast: negative for breast lump, breast tenderness, nipple discharge and skin lesion(s)    Objective:       BP 119/83   Pulse 77   Ht '5\' 6"'$  (1.676 m)   Wt 229 lb 9.6 oz (104.1 kg)   LMP 07/02/2021   BMI 37.06 kg/m  General:   Alert and no distress  Skin:   no rash or abnormalities  Lungs:   clear to auscultation bilaterally  Heart:   regular rate and rhythm, S1, S2 normal, no murmur, click, rub or gallop  Breasts:   normal without suspicious masses, skin or nipple changes or axillary nodes  Abdomen:  normal findings: no organomegaly, soft, non-tender and no hernia  Pelvis:  External genitalia: normal general appearance Urinary system: urethral meatus normal and bladder without fullness, nontender Vaginal: normal without tenderness, induration or masses Cervix: normal appearance Adnexa: normal bimanual exam Uterus: anteverted and non-tender, normal size   Lab Review Urine pregnancy test Labs reviewed yes Radiologic studies reviewed no  I have spent a total of 20 minutes of face-to-face time, excluding clinical staff time, reviewing notes and preparing to see patient, ordering tests and/or medications, and counseling the patient.   Assessment:    1. Encounter for gynecological examination with Papanicolaou smear of cervix Rx: - Cytology - PAP( [facility]) - Multiple Vitamins-Minerals (MULTI-VITAMIN GUMMIES PO)  2. Vaginal  discharge Rx: - Cervicovaginal ancillary only  3. Screening for STD (sexually transmitted disease) Rx: - HIV Antibody (routine testing w rflx) - Hepatitis B surface antigen - RPR - Hepatitis C antibody  4. Screening breast examination Rx: - MM Digital Screening; Future  5. HTN (hypertension), benign - managed by PCP  6. Obesity (BMI 35.0-39.9 without comorbidity) - weight reduction with the aid of dietary changes, exeercise and behavioral modification recommended    Plan:    Education reviewed: calcium supplements, depression evaluation, low fat, low cholesterol diet, safe sex/STD prevention, self breast exams, and weight bearing exercise. Mammogram ordered. Follow up in: 1 year.    Orders Placed This Encounter  Procedures   MM Digital Screening    Standing Status:   Future    Standing Expiration Date:   07/18/2022    Order Specific Question:   Reason for Exam (SYMPTOM  OR DIAGNOSIS REQUIRED)    Answer:   Screening  Order Specific Question:   Is the patient pregnant?    Answer:   No    Order Specific Question:   Preferred imaging location?    Answer:   External   HIV Antibody (routine testing w rflx)   Hepatitis B surface antigen   RPR   Hepatitis C antibody     Shelly Bombard, MD 07/17/2021 1:56 PM

## 2021-07-18 ENCOUNTER — Other Ambulatory Visit: Payer: Self-pay

## 2021-07-18 LAB — CYTOLOGY - PAP: Diagnosis: NEGATIVE

## 2021-07-20 NOTE — Progress Notes (Unsigned)
[facility]  [address] [place],  [place]  [address] [phone]  Clinic Day:  07/20/2021  Referring physician: Jeanie Sewer, NP   HISTORY OF PRESENT ILLNESS:  The patient is a 41 y.o. female with  iron deficiency anemia due to heavy menstrual bleeding.  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  We discussed giving her IV iron to replenish her iron stores more rapidly, but as she was not severely anemic and was tolerating oral iron well, she opted to continue oral iron.  She denies progressive fatigue concerning for worsening anemia.  PHYSICAL EXAM:  Last menstrual period 07/02/2021. Wt Readings from Last 3 Encounters:  07/17/21 229 lb 9.6 oz (104.1 kg)  06/11/21 227 lb 1.6 oz (103 kg)  01/12/19 248 lb (112.5 kg)   There is no height or weight on file to calculate BMI.  Performance status (ECOG): {CHL ONC [id number]  Physical Exam Vitals and nursing note reviewed.  Constitutional:      General: She is not in acute distress.    Appearance: Normal appearance.  HENT:     Head: Normocephalic and atraumatic.     Mouth/Throat:     Mouth: Mucous membranes are moist.     Pharynx: Oropharynx is clear. No oropharyngeal exudate or posterior oropharyngeal erythema.  Eyes:     General: No scleral icterus.    Extraocular Movements: Extraocular movements intact.     Conjunctiva/sclera: Conjunctivae normal.     Pupils: Pupils are equal, round, and reactive to light.  Cardiovascular:     Rate and Rhythm: Normal rate and regular rhythm.     Heart sounds: Normal heart sounds. No murmur heard.    No friction rub. No gallop.  Pulmonary:     Effort: Pulmonary effort is normal.     Breath sounds: Normal breath sounds. No wheezing, rhonchi or rales.  Abdominal:     General: There is no distension.     Palpations: Abdomen is soft. There is no hepatomegaly, splenomegaly or mass.      Tenderness: There is no abdominal tenderness.  Musculoskeletal:        General: Normal range of motion.     Cervical back: Normal range of motion and neck supple. No tenderness.     Right lower leg: No edema.     Left lower leg: No edema.  Lymphadenopathy:     Cervical: No cervical adenopathy.     Upper Body:     Right upper body: No supraclavicular or axillary adenopathy.     Left upper body: No supraclavicular or axillary adenopathy.     Lower Body: No right inguinal adenopathy. No left inguinal adenopathy.  Skin:    General: Skin is warm and dry.     Coloration: Skin is not jaundiced.     Findings: No rash.  Neurological:     Mental Status: She is alert and oriented to person, place, and time.     Cranial Nerves: No cranial nerve deficit.  Psychiatric:        Mood and Affect: Mood normal.        Behavior: Behavior normal.        Thought Content: Thought content normal.   LABS:      Latest Ref Rng & Units 06/11/2021   12:00 AM 08/03/2018   12:09 PM 07/21/2016    5:07 AM  CBC  WBC  2.3  5.4  5.9   Hemoglobin 12.0 - 16.0 11.3     11.2  8.0   Hematocrit 36 - 46 36     33.1  23.3   Platelets 150 - 400 K/uL 261     499  209      This result is from an external source.      Latest Ref Rng & Units 06/11/2021   12:00 AM 07/17/2016    7:46 AM 06/30/2016    3:21 PM  CMP  Glucose 65 - 99 mg/dL  110  91   BUN 4 - '21 15     16  15   '$ Creatinine 0.5 - 1.1 1.0     0.83  0.78   Sodium 137 - 147 141     135  136   Potassium 3.5 - 5.1 mEq/L 4.1     4.5  4.4   Chloride 99 - 108 104     106  107   CO2 13 - '22 27     22  23   '$ Calcium 8.7 - 10.7 9.3     8.8  8.8   Total Protein 6.5 - 8.1 g/dL  6.7  6.3   Total Bilirubin 0.3 - 1.2 mg/dL  0.5  0.3   Alkaline Phos 25 - 125 55     78  62   AST 13 - 35 '19     22  13   '$ ALT 7 - 35 U/L '11     10  11      '$ This result is from an external source.     No results found for: "CEA1", "CEA" / No results found for: "CEA1", "CEA" No results  found for: "PSA1" No results found for: "SJG283" No results found for: "CAN125"  No results found for: "TOTALPROTELP", "ALBUMINELP", "A1GS", "A2GS", "BETS", "BETA2SER", "GAMS", "MSPIKE", "SPEI" Lab Results  Component Value Date   TIBC 426 06/11/2021   FERRITIN 8 (L) 06/11/2021   IRONPCTSAT 29 06/11/2021   Lab Results  Component Value Date   LDH 105 06/11/2021       Component Value Date/Time   LDH 105 06/11/2021 1002    Review Flowsheet       Latest Ref Rng & Units 06/11/2021  Oncology Labs  Ferritin 11 - 307 ng/mL 8   %SAT 10.4 - 31.8 % 29   LDH 98 - 192 U/L 105      STUDIES:  No results found.    ASSESSMENT & PLAN:   Assessment/Plan:  A 41 y.o. female with *** .The patient understands all the plans discussed today and is in agreement with them.      Marvia Pickles, PA-C

## 2021-07-23 ENCOUNTER — Inpatient Hospital Stay: Payer: Medicaid Other | Attending: Hematology and Oncology | Admitting: Hematology and Oncology

## 2021-07-23 ENCOUNTER — Inpatient Hospital Stay: Payer: Medicaid Other

## 2021-07-23 ENCOUNTER — Encounter: Payer: Self-pay | Admitting: Hematology and Oncology

## 2021-07-23 ENCOUNTER — Other Ambulatory Visit: Payer: Self-pay

## 2021-07-23 ENCOUNTER — Telehealth: Payer: Self-pay | Admitting: Hematology and Oncology

## 2021-07-23 DIAGNOSIS — D709 Neutropenia, unspecified: Secondary | ICD-10-CM | POA: Insufficient documentation

## 2021-07-23 DIAGNOSIS — N920 Excessive and frequent menstruation with regular cycle: Secondary | ICD-10-CM | POA: Diagnosis present

## 2021-07-23 DIAGNOSIS — Z79899 Other long term (current) drug therapy: Secondary | ICD-10-CM | POA: Insufficient documentation

## 2021-07-23 DIAGNOSIS — D649 Anemia, unspecified: Secondary | ICD-10-CM

## 2021-07-23 DIAGNOSIS — D500 Iron deficiency anemia secondary to blood loss (chronic): Secondary | ICD-10-CM | POA: Diagnosis present

## 2021-07-23 LAB — FERRITIN: Ferritin: 7 ng/mL — ABNORMAL LOW (ref 11–307)

## 2021-07-23 LAB — IRON AND TIBC
Iron: 93 ug/dL (ref 28–170)
Saturation Ratios: 22 % (ref 10.4–31.8)
TIBC: 429 ug/dL (ref 250–450)
UIBC: 336 ug/dL

## 2021-07-23 LAB — CBC
MCV: 91 (ref 81–99)
RBC: 4.04 (ref 3.87–5.11)

## 2021-07-23 LAB — CBC AND DIFFERENTIAL
HCT: 37 (ref 36–46)
Hemoglobin: 11.6 — AB (ref 12.0–16.0)
Neutrophils Absolute: 1.04
Platelets: 235 10*3/uL (ref 150–400)
WBC: 2.3

## 2021-07-23 NOTE — Telephone Encounter (Signed)
PER 07/23/21 LOS NEXT APPT SCHEDULED AND CONFIRMED WITH PATIENT

## 2021-07-24 ENCOUNTER — Encounter: Payer: Self-pay | Admitting: Hematology and Oncology

## 2021-07-26 ENCOUNTER — Inpatient Hospital Stay: Payer: Medicaid Other

## 2021-07-26 VITALS — BP 121/91 | HR 66 | Temp 98.4°F | Resp 20 | Ht 66.0 in | Wt 229.0 lb

## 2021-07-26 DIAGNOSIS — D500 Iron deficiency anemia secondary to blood loss (chronic): Secondary | ICD-10-CM | POA: Diagnosis not present

## 2021-07-26 MED ORDER — SODIUM CHLORIDE 0.9 % IV SOLN
510.0000 mg | Freq: Once | INTRAVENOUS | Status: AC
  Administered 2021-07-26: 510 mg via INTRAVENOUS
  Filled 2021-07-26: qty 510

## 2021-07-26 MED ORDER — ACETAMINOPHEN 325 MG PO TABS
650.0000 mg | ORAL_TABLET | Freq: Once | ORAL | Status: AC
  Administered 2021-07-26: 650 mg via ORAL
  Filled 2021-07-26: qty 2

## 2021-07-26 MED ORDER — FAMOTIDINE IN NACL 20-0.9 MG/50ML-% IV SOLN
20.0000 mg | Freq: Once | INTRAVENOUS | Status: AC
  Administered 2021-07-26: 20 mg via INTRAVENOUS
  Filled 2021-07-26: qty 50

## 2021-07-26 MED ORDER — SODIUM CHLORIDE 0.9 % IV SOLN: Freq: Once | INTRAVENOUS | Status: AC

## 2021-07-26 NOTE — Patient Instructions (Signed)

## 2021-07-27 ENCOUNTER — Ambulatory Visit (HOSPITAL_BASED_OUTPATIENT_CLINIC_OR_DEPARTMENT_OTHER): Payer: Commercial Managed Care - PPO | Admitting: Radiology

## 2021-08-02 ENCOUNTER — Inpatient Hospital Stay: Payer: Medicaid Other

## 2021-08-02 VITALS — BP 97/55 | HR 72 | Temp 98.1°F | Resp 20

## 2021-08-02 DIAGNOSIS — D500 Iron deficiency anemia secondary to blood loss (chronic): Secondary | ICD-10-CM

## 2021-08-02 MED ORDER — ACETAMINOPHEN 325 MG PO TABS
650.0000 mg | ORAL_TABLET | Freq: Once | ORAL | Status: AC
  Administered 2021-08-02: 650 mg via ORAL
  Filled 2021-08-02: qty 2

## 2021-08-02 MED ORDER — FAMOTIDINE IN NACL 20-0.9 MG/50ML-% IV SOLN
20.0000 mg | Freq: Once | INTRAVENOUS | Status: AC
  Administered 2021-08-02: 20 mg via INTRAVENOUS
  Filled 2021-08-02: qty 50

## 2021-08-02 MED ORDER — SODIUM CHLORIDE 0.9 % IV SOLN
510.0000 mg | Freq: Once | INTRAVENOUS | Status: AC
  Administered 2021-08-02: 510 mg via INTRAVENOUS
  Filled 2021-08-02: qty 17

## 2021-08-02 MED ORDER — SODIUM CHLORIDE 0.9 % IV SOLN: Freq: Once | INTRAVENOUS | Status: AC

## 2021-09-21 ENCOUNTER — Other Ambulatory Visit: Payer: Self-pay | Admitting: Adult Health

## 2021-09-21 DIAGNOSIS — R928 Other abnormal and inconclusive findings on diagnostic imaging of breast: Secondary | ICD-10-CM

## 2021-10-02 ENCOUNTER — Other Ambulatory Visit: Payer: Self-pay | Admitting: Adult Health

## 2021-10-02 DIAGNOSIS — R928 Other abnormal and inconclusive findings on diagnostic imaging of breast: Secondary | ICD-10-CM

## 2021-10-03 ENCOUNTER — Inpatient Hospital Stay: Admission: RE | Admit: 2021-10-03 | Payer: Medicaid Other | Source: Ambulatory Visit

## 2021-10-19 ENCOUNTER — Encounter: Payer: Self-pay | Admitting: Hematology and Oncology

## 2021-10-19 DIAGNOSIS — D72819 Decreased white blood cell count, unspecified: Secondary | ICD-10-CM

## 2021-10-19 HISTORY — DX: Decreased white blood cell count, unspecified: D72.819

## 2021-10-22 NOTE — Progress Notes (Unsigned)
[facility]  [address] [place],  [place]  [address] [phone]  Clinic Day:  10/23/2021  Referring physician: Jeanie Sewer, NP   HISTORY OF PRESENT ILLNESS:  The patient is a 41 y.o. female with iron deficiency due to menorrhagia.  She is her for repeat clinical assessment after receiving IV iron in the form of Feraheme in June. 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 Labs done at her PCP office on September 5th revealed a normal hemoglobin and normal iron studies. Her white blood cell count remained low at 2.1 with a normal differential.  She generally feels well.  She continues to report heavy menses.  She denies other overt form of blood loss.  She had a baseline screening mammogram in July and was brought back for left diagnostic mammogram, which revealed a suspicious areas of calcification within the left lateral breast spanning 18.6 x 8.5 x 4.8 cm, suspicious for ductal carcinoma in situ.  She is scheduled for biopsy in [place] next week.  PHYSICAL EXAM:  Blood pressure 111/79, pulse 70, temperature 98.6 F (37 C), temperature source Oral, resp. rate 20, height '5\' 6"'$  (1.676 m), weight 225 lb 9.6 oz (102.3 kg), SpO2 98 %. Wt Readings from Last 3 Encounters:  10/23/21 225 lb 9.6 oz (102.3 kg)  07/26/21 229 lb (103.9 kg)  07/23/21 229 lb (103.9 kg)   Body mass index is 36.41 kg/m.  Performance status (ECOG): 0 - Asymptomatic  Physical Exam Vitals and nursing note reviewed.  Constitutional:      General: She is not in acute distress.    Appearance: Normal appearance.  HENT:     Head: Normocephalic and atraumatic.     Mouth/Throat:     Mouth: Mucous membranes are moist.     Pharynx: Oropharynx is clear. No oropharyngeal exudate or posterior oropharyngeal erythema.  Eyes:     General: No scleral icterus.    Extraocular Movements: Extraocular movements intact.     Conjunctiva/sclera:  Conjunctivae normal.     Pupils: Pupils are equal, round, and reactive to light.  Cardiovascular:     Rate and Rhythm: Normal rate and regular rhythm.     Heart sounds: Normal heart sounds. No murmur heard.    No friction rub. No gallop.  Pulmonary:     Effort: Pulmonary effort is normal.     Breath sounds: Normal breath sounds. No wheezing, rhonchi or rales.  Abdominal:     General: There is no distension.     Palpations: Abdomen is soft. There is no hepatomegaly, splenomegaly or mass.     Tenderness: There is no abdominal tenderness.  Musculoskeletal:        General: Normal range of motion.     Cervical back: Normal range of motion and neck supple. No tenderness.     Right lower leg: No edema.     Left lower leg: No edema.  Lymphadenopathy:     Cervical: No cervical adenopathy.     Upper Body:     Right upper body: No supraclavicular or axillary adenopathy.     Left upper body: No supraclavicular or axillary adenopathy.     Lower Body: No right inguinal adenopathy. No left inguinal adenopathy.  Skin:    General: Skin is warm and dry.     Coloration: Skin is not jaundiced.     Findings: No rash.  Neurological:     Mental Status: She is alert  and oriented to person, place, and time.     Cranial Nerves: No cranial nerve deficit.  Psychiatric:        Mood and Affect: Mood normal.        Behavior: Behavior normal.        Thought Content: Thought content normal.    LABS:      Latest Ref Rng & Units 10/23/2021   12:00 AM 07/23/2021   12:00 AM 06/11/2021   12:00 AM  CBC  WBC  2.6     2.3     2.3      Hemoglobin 12.0 - 16.0 12.6     11.6     11.3      Hematocrit 36 - 46 38     37     36      Platelets 150 - 400 K/uL 216     235     261         This result is from an external source.      Latest Ref Rng & Units 06/11/2021   12:00 AM 07/17/2016    7:46 AM 06/30/2016    3:21 PM  CMP  Glucose 65 - 99 mg/dL  110  91   BUN 4 - '21 15     16  15   '$ Creatinine 0.5 - 1.1 1.0     0.83   0.78   Sodium 137 - 147 141     135  136   Potassium 3.5 - 5.1 mEq/L 4.1     4.5  4.4   Chloride 99 - 108 104     106  107   CO2 13 - '22 27     22  23   '$ Calcium 8.7 - 10.7 9.3     8.8  8.8   Total Protein 6.5 - 8.1 g/dL  6.7  6.3   Total Bilirubin 0.3 - 1.2 mg/dL  0.5  0.3   Alkaline Phos 25 - 125 55     78  62   AST 13 - 35 '19     22  13   '$ ALT 7 - 35 U/L '11     10  11      '$ This result is from an external source.     No results found for: "CEA1", "CEA" / No results found for: "CEA1", "CEA" No results found for: "PSA1" No results found for: "POE423" No results found for: "CAN125"  No results found for: "TOTALPROTELP", "ALBUMINELP", "A1GS", "A2GS", "BETS", "BETA2SER", "GAMS", "MSPIKE", "SPEI" Lab Results  Component Value Date   TIBC 429 07/23/2021   TIBC 426 06/11/2021   FERRITIN 7 (L) 07/23/2021   FERRITIN 8 (L) 06/11/2021   IRONPCTSAT 22 07/23/2021   IRONPCTSAT 29 06/11/2021   Lab Results  Component Value Date   LDH 105 06/11/2021       Component Value Date/Time   LDH 105 06/11/2021 1002    Review Flowsheet       Latest Ref Rng & Units 06/11/2021 07/23/2021  Oncology Labs  Ferritin 11 - 307 ng/mL 8  7   %SAT 10.4 - 31.8 % 29  22   LDH 98 - 192 U/L 105  -     STUDIES:  No results found.    Exam(s): [id number] MAM/MAM DIGITAL W/TOMO DIAG L  CLINICAL DATA: Patient returns after baseline screening study for  evaluation of LEFT breast calcifications.  EXAM:  DIGITAL DIAGNOSTIC UNILATERAL LEFT MAMMOGRAM WITH TOMOSYNTHESIS  TECHNIQUE:  Left digital diagnostic mammography and breast tomosynthesis was  performed.  COMPARISON: Previous exam(s).  ACR Breast Density Category b: There are scattered areas of  fibroglandular density.  FINDINGS:  Magnified views are performed of calcifications throughout the  LATERAL portion of the LEFT breast. These views demonstrate faint  pleomorphic and linear calcifications spanning at least 18.6 x 8.5 x  4.8 centimeters.  Calcifications are also in a linear distribution  and suspicious for ductal carcinoma in situ. No associated mass or  distortion.  IMPRESSION:  Suspicious LEFT breast calcifications warranting tissue diagnosis.  RECOMMENDATION:  3D stereotactic guided core biopsy of 2 sites of calcifications in  the LEFT breast.     Exam(s): [id number] MAM/MAM DIGITAL TOMO SCREENING B  CLINICAL DATA: Screening. Baseline.  EXAM:  DIGITAL SCREENING BILATERAL MAMMOGRAM WITH TOMOSYNTHESIS AND CAD  TECHNIQUE:  Bilateral screening digital craniocaudal and mediolateral oblique  mammograms were obtained. Bilateral screening digital breast  tomosynthesis was performed. The images were evaluated with  computer-aided detection.  COMPARISON: None available.  ACR Breast Density Category b: There are scattered areas of  fibroglandular density.  FINDINGS:  In the left breast, calcifications warrant further evaluation. These  calcifications are seen within the upper-outer quadrant of the LEFT  breast and within the upper subareolar LEFT breast.  In the right breast, no findings suspicious for malignancy.  IMPRESSION:  Further evaluation is suggested for calcifications in the left  breast.  RECOMMENDATION:  Diagnostic mammogram of the left breast. (Code:FI-L-18M)  The patient will be contacted regarding the findings, and additional  imaging will be scheduled.  BI-RADS CATEGORY 0: Incomplete. Need additional imaging evaluation  and/or prior mammograms for comparison.   ASSESSMENT & PLAN:   Assessment/Plan:  41 y.o. female with iron deficiency anemia felt to be due to heavy menses.  Her hemoglobin and iron studies have normalized with IV Feraheme.  She will continue oral iron supplement daily.  Her total white blood cell count and ANC have increased slightly.  This is felt to potentially represent benign cyclic neutropenia, but I recommend continued follow-up of her white blood count.    She has an area of  calcifications within the left breast suspicious for ductal carcinoma in situ.  She is scheduled for biopsy September 19th.  She knows we could care for her here if she is found to have malignancy.  I will plan to see her back in 3 months for repeat clinical assessment.  The patient understands all the plans discussed today and is in agreement with them.  She knows to contact our office if she develops symptoms of anemia or other concerns prior to her next appointment    Marvia Pickles, PA-C

## 2021-10-23 ENCOUNTER — Inpatient Hospital Stay: Payer: Medicaid Other | Attending: Hematology and Oncology | Admitting: Hematology and Oncology

## 2021-10-23 ENCOUNTER — Other Ambulatory Visit: Payer: Self-pay | Admitting: Adult Health

## 2021-10-23 ENCOUNTER — Encounter: Payer: Self-pay | Admitting: Hematology and Oncology

## 2021-10-23 ENCOUNTER — Inpatient Hospital Stay: Payer: Medicaid Other

## 2021-10-23 ENCOUNTER — Telehealth: Payer: Self-pay | Admitting: Hematology and Oncology

## 2021-10-23 VITALS — BP 111/79 | HR 70 | Temp 98.6°F | Resp 20 | Ht 66.0 in | Wt 225.6 lb

## 2021-10-23 DIAGNOSIS — D500 Iron deficiency anemia secondary to blood loss (chronic): Secondary | ICD-10-CM | POA: Diagnosis not present

## 2021-10-23 DIAGNOSIS — R928 Other abnormal and inconclusive findings on diagnostic imaging of breast: Secondary | ICD-10-CM

## 2021-10-23 LAB — CBC
MCV: 94 (ref 81–99)
RBC: 4.02 (ref 3.87–5.11)

## 2021-10-23 LAB — CBC AND DIFFERENTIAL
HCT: 38 (ref 36–46)
Hemoglobin: 12.6 (ref 12.0–16.0)
Neutrophils Absolute: 1.12
Platelets: 216 10*3/uL (ref 150–400)
WBC: 2.6

## 2021-10-23 NOTE — Telephone Encounter (Signed)
10/23/21 Next appt scheduled and confirmed with patient 

## 2021-11-02 ENCOUNTER — Ambulatory Visit
Admission: RE | Admit: 2021-11-02 | Discharge: 2021-11-02 | Disposition: A | Discharge status: Home / Self Care | Payer: Medicaid Other | Source: Ambulatory Visit | Attending: Adult Health | Admitting: Adult Health

## 2021-11-02 DIAGNOSIS — R928 Other abnormal and inconclusive findings on diagnostic imaging of breast: Secondary | ICD-10-CM

## 2021-11-02 DIAGNOSIS — R921 Mammographic calcification found on diagnostic imaging of breast: Secondary | ICD-10-CM

## 2021-11-05 ENCOUNTER — Encounter: Payer: Self-pay | Admitting: *Deleted

## 2021-11-07 ENCOUNTER — Telehealth: Payer: Self-pay | Admitting: Hematology and Oncology

## 2021-11-07 NOTE — Telephone Encounter (Signed)
Spoke to patient to confirm morning clinic appointment for 10/4, will e-mail paperwork

## 2021-11-12 ENCOUNTER — Encounter: Payer: Self-pay | Admitting: *Deleted

## 2021-11-12 DIAGNOSIS — D0512 Intraductal carcinoma in situ of left breast: Secondary | ICD-10-CM | POA: Insufficient documentation

## 2021-11-12 NOTE — Progress Notes (Signed)
Radiation Oncology         [phone] ________________________________  Name: Casey Lang        MRN: [id number]  Date of Service: 11/14/2021 DOB: May 26, 1980  CC:Jeanie Sewer, NP  Jovita Kussmaul, MD     REFERRING PHYSICIAN: Autumn Messing III, MD   DIAGNOSIS: The encounter diagnosis was Ductal carcinoma in situ (DCIS) of left breast.   HISTORY OF PRESENT ILLNESS: Casey Lang is a 41 y.o. female seen in the multidisciplinary breast clinic for a new diagnosis of left breast cancer. The patient was noted on screening mammography to have a large group of calcifications.  From the notes from her [facility] practitioners in [place] these measured up to 18.6 cm in the left breast.  A biopsy on 11/02/2021 of the posterior extent showed high-grade DCIS with necrosis and microcalcifications.  No invasive disease was appreciated the second biopsy which was the anterior extent again confirmed high-grade DCIS with no evidence of invasive component.  Her cancer was ER/PR positive in both specimens.  She is seen to discuss treatment recommendations of her cancer.  Of note she had been followed by hematology due to iron deficiency anemia from menorrhagia.  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   Depression    Gestational hypertension    Hypertension    pregnancy induced hypertension   Leukopenia 10/19/2021   Mental disorder    PTSD (post-traumatic stress disorder)    Right foot injury 10/09/2012   Resolved without complication       PAST SURGICAL HISTORY: Past Surgical History:  Procedure Laterality Date   FOOT SURGERY     HEMATOMA EVACUATION N/A 07/18/2016   Procedure: EVACUATION HEMATOMA;  Surgeon: Donnamae Jude, MD;  Location:  [facility];  Service: Gynecology;  Laterality: N/A;   LAPAROSCOPIC GASTRIC SLEEVE RESECTION  10/09/2012   Surgery was on 09/30/2011   TUBAL LIGATION Bilateral 07/18/2016   Procedure: POST PARTUM TUBAL LIGATION;  Surgeon: Donnamae Jude, MD;  Location: [facility];  Service: Gynecology;  Laterality: Bilateral;     FAMILY HISTORY:  Family History  Problem Relation Age of Onset   Hypertension Mother    Diabetes Mother    Hyperlipidemia Mother    Diabetes Father    Hypertension Father    Asthma Brother    Hypertension Maternal Grandmother    Stroke Maternal Grandmother    Aneurysm Maternal Grandmother    Arthritis Maternal Grandmother    Heart attack Maternal Grandmother    Hypercholesterolemia Maternal Grandmother    Cancer Paternal Grandmother    Alzheimer's disease Paternal Grandmother    Breast cancer Paternal Grandmother    Anxiety disorder Other    Depression Other    Seizures Neg Hx      SOCIAL HISTORY:  reports that she has never smoked. She has never used smokeless tobacco. She reports that she does not drink alcohol and does not use drugs. The patient is single and resides in [facility].  She works at [employer] as a***  ALLERGIES: Morphine and related   MEDICATIONS:  Current Outpatient Medications  Medication Sig Dispense Refill   Biotin (YUMVS BIOTIN MAX POTENCY) [place] by mouth.  Calcium Carb-Cholecalciferol (OYSTER SHELL CALCIUM W/D) 500-5 MG-MCG TABS Take by mouth.     ferrous sulfate 325 (65 FE) MG tablet Take 325 mg by mouth daily with breakfast.     gabapentin (NEURONTIN) 300 MG capsule Take 300 mg by mouth at bedtime.     Lactobacillus Rhamnosus, GG, (CULTURELLE) CAPS Take 1 capsule by mouth daily.     Multiple Vitamin (MULTI-VITAMIN) tablet Take 1 tablet by mouth daily.     Omega-3 1000 MG CAPS Take by mouth.     QUEtiapine (SEROQUEL) 25 MG tablet Take 25 mg by mouth. Three tabs daily /total of 75 mg per  patient     Turmeric 400 MG CAPS Take by mouth.     No current facility-administered medications for this visit.     REVIEW OF SYSTEMS: On review of systems, the patient reports that she is doing ***     PHYSICAL EXAM:  Wt Readings from Last 3 Encounters:  10/23/21 225 lb 9.6 oz (102.3 kg)  07/26/21 229 lb (103.9 kg)  07/23/21 229 lb (103.9 kg)   Temp Readings from Last 3 Encounters:  10/23/21 98.6 F (37 C) (Oral)  08/02/21 98.1 F (36.7 C) (Oral)  07/26/21 98.4 F (36.9 C)   BP Readings from Last 3 Encounters:  10/23/21 111/79  08/02/21 (!) 97/55  07/26/21 (!) 121/91   Pulse Readings from Last 3 Encounters:  10/23/21 70  08/02/21 72  07/26/21 66    In general this is a well appearing African-American female in no acute distress. She's alert and oriented x4 and appropriate throughout the examination. Cardiopulmonary assessment is negative for acute distress and she exhibits normal effort. Bilateral breast exam is deferred.    ECOG = ***  0 - Asymptomatic (Fully active, able to carry on all predisease activities without restriction)  1 - Symptomatic but completely ambulatory (Restricted in physically strenuous activity but ambulatory and able to carry out work of a light or sedentary nature. For example, light housework, office work)  2 - Symptomatic, <50% in bed during the day (Ambulatory and capable of all self care but unable to carry out any work activities. Up and about more than 50% of waking hours)  3 - Symptomatic, >50% in bed, but not bedbound (Capable of only limited self-care, confined to bed or chair 50% or more of waking hours)  4 - Bedbound (Completely disabled. Cannot carry on any self-care. Totally confined to bed or chair)  5 - Death   Eustace Pen MM, Creech RH, Tormey DC, et al. [phone]). "Toxicity and response criteria of the [facility]". [place] Oncol. 5 (6): 649-55    LABORATORY DATA:  Lab Results  Component Value  Date   WBC 2.6 10/23/2021   HGB 12.6 10/23/2021   HCT 38 10/23/2021   MCV 94 10/23/2021   PLT 216 10/23/2021   Lab Results  Component Value Date   NA 141 06/11/2021   K 4.1 06/11/2021   CL 104 06/11/2021   CO2 27 (A) 06/11/2021   Lab Results  Component Value Date   ALT 11 06/11/2021   AST 19 06/11/2021   ALKPHOS 55 06/11/2021   BILITOT 0.5 07/17/2016      RADIOGRAPHY: MM LT BREAST BX W LOC DEV 1ST LESION IMAGE BX SPEC STEREO GUIDE  Addendum Date: 11/07/2021   ADDENDUM REPORT: 11/07/2021 07:51 ADDENDUM: Pathology revealed HIGH-GRADE DUCTAL CARCINOMA IN SITU, CRIBRIFORM AND MICROPAPILLARY TYPES WITH NECROSIS AND HIGH-GRADE DUCTAL CARCINOMA IN SITU, CRIBRIFORM  AND MICROPAPILLARY TYPES WITH NECROSIS AND CANCERIZATION OF LOBULES, MICROCALCIFICATIONS PRESENT WITHIN DCIS PRESENT IN MULTIPLE CORES of the LEFT breast, outer, posterior extent, (coil clip). This was found to be concordant by Dr. Claudie Revering. Pathology revealed HIGH-GRADE DUCTAL CARCINOMA IN SITU, SOLID TYPE WITH NECROSIS AND CALCIFICATIONS of the LEFT breast, outer, anterior extent, (ribbon clip). This was found to be concordant by Dr. Claudie Revering. Pathology results were discussed with the patient by telephone. The patient reported doing well after the biopsies with tenderness, bleeding and bruising at the sites. Post biopsy instructions and care were reviewed and questions were answered. The patient was encouraged to call [facility] for any additional concerns. My direct phone number was provided. Pathology results were called and faxed to Dr. Ardis Rowan at [place], [facility] [place] location, on November 06, 2021. The patient was referred to [facility] on November 14, 2021. Consideration for a bilateral breast MRI for further evaluation of extent of disease given the high grade histology and age. Pathology results  reported by Terie Purser, RN on 11/06/2021. Electronically Signed   By: Claudie Revering M.D.   On: 11/07/2021 07:51   Result Date: 11/07/2021 CLINICAL DATA:  18.6 x 8.5 x 4.8 cm area of suspicious calcifications in the lateral left breast at recent mammography. Stereotactic guided core needle biopsy of 2 areas was recommended. EXAM: LEFT BREAST STEREOTACTIC CORE NEEDLE BIOPSY X 2 COMPARISON:  Previous exam(s). FINDINGS: The patient and I discussed the procedure of stereotactic-guided biopsy including benefits and alternatives. We discussed the high likelihood of a successful procedure. We discussed the risks of the procedure including infection, bleeding, tissue injury, clip migration, and inadequate sampling. Informed written consent was given. The usual time out protocol was performed immediately prior to the procedure. SITE 1: POSTERIOR EXTENT OF THE 18.6 CM AREA OF CALCIFICATIONS IN THE LATERAL LEFT BREAST Using sterile technique and 1% Lidocaine as local anesthetic, under stereotactic guidance, a 9 gauge vacuum assisted device was used to perform core needle biopsy of the posterior extent of the 18.6 cm area of calcifications in the lateral left breast using a cephalad approach. Specimen radiograph was performed showing multiple calcifications in all of the specimens. Specimens with calcifications are identified for pathology. At the conclusion of the procedure, a coil shaped tissue marker clip was deployed into the biopsy cavity. SITE 2: ANTERIOR EXTENT OF THE 18.6 CM AREA OF CALCIFICATIONS IN THE LATERAL LEFT BREAST Using sterile technique and 1% Lidocaine as local anesthetic, under stereotactic guidance, a 9 gauge vacuum assisted device was used to perform core needle biopsy of the anterior extent of the 18.6 cm area of calcifications in the lateral left breast using a cephalad approach. Specimen radiograph was performed showing calcifications in 2 specimens. Specimens with calcifications are identified for  pathology. At the conclusion of the procedure, a ribbon shaped tissue marker clip was deployed into the biopsy cavity. Follow-up 2-view mammogram was performed and dictated separately. IMPRESSION: Stereotactic-guided biopsy of the posterior and anterior extents of the recently demonstrated 18.6 cm area of calcifications in the lateral left breast. No apparent complications. Electronically Signed: By: Claudie Revering M.D. On: 11/02/2021 09:17  MM LT BREAST BX W LOC DEV EA AD LESION IMG BX SPEC STEREO GUIDE  Addendum Date: 11/07/2021   ADDENDUM REPORT: 11/07/2021 07:51 ADDENDUM: Pathology revealed HIGH-GRADE DUCTAL CARCINOMA IN SITU, CRIBRIFORM AND MICROPAPILLARY TYPES WITH NECROSIS AND HIGH-GRADE DUCTAL  CARCINOMA IN SITU, CRIBRIFORM AND MICROPAPILLARY TYPES WITH NECROSIS AND CANCERIZATION OF LOBULES, MICROCALCIFICATIONS PRESENT WITHIN DCIS PRESENT IN MULTIPLE CORES of the LEFT breast, outer, posterior extent, (coil clip). This was found to be concordant by Dr. Claudie Revering. Pathology revealed HIGH-GRADE DUCTAL CARCINOMA IN SITU, SOLID TYPE WITH NECROSIS AND CALCIFICATIONS of the LEFT breast, outer, anterior extent, (ribbon clip). This was found to be concordant by Dr. Claudie Revering. Pathology results were discussed with the patient by telephone. The patient reported doing well after the biopsies with tenderness, bleeding and bruising at the sites. Post biopsy instructions and care were reviewed and questions were answered. The patient was encouraged to call [facility] for any additional concerns. My direct phone number was provided. Pathology results were called and faxed to Dr. Ardis Rowan at [place], [facility] location, on November 06, 2021. The patient was referred to [facility] on November 14, 2021. Consideration for a bilateral breast MRI for further evaluation of extent of disease given the  high grade histology and age. Pathology results reported by Terie Purser, RN on 11/06/2021. Electronically Signed   By: Claudie Revering M.D.   On: 11/07/2021 07:51   Result Date: 11/07/2021 CLINICAL DATA:  18.6 x 8.5 x 4.8 cm area of suspicious calcifications in the lateral left breast at recent mammography. Stereotactic guided core needle biopsy of 2 areas was recommended. EXAM: LEFT BREAST STEREOTACTIC CORE NEEDLE BIOPSY X 2 COMPARISON:  Previous exam(s). FINDINGS: The patient and I discussed the procedure of stereotactic-guided biopsy including benefits and alternatives. We discussed the high likelihood of a successful procedure. We discussed the risks of the procedure including infection, bleeding, tissue injury, clip migration, and inadequate sampling. Informed written consent was given. The usual time out protocol was performed immediately prior to the procedure. SITE 1: POSTERIOR EXTENT OF THE 18.6 CM AREA OF CALCIFICATIONS IN THE LATERAL LEFT BREAST Using sterile technique and 1% Lidocaine as local anesthetic, under stereotactic guidance, a 9 gauge vacuum assisted device was used to perform core needle biopsy of the posterior extent of the 18.6 cm area of calcifications in the lateral left breast using a cephalad approach. Specimen radiograph was performed showing multiple calcifications in all of the specimens. Specimens with calcifications are identified for pathology. At the conclusion of the procedure, a coil shaped tissue marker clip was deployed into the biopsy cavity. SITE 2: ANTERIOR EXTENT OF THE 18.6 CM AREA OF CALCIFICATIONS IN THE LATERAL LEFT BREAST Using sterile technique and 1% Lidocaine as local anesthetic, under stereotactic guidance, a 9 gauge vacuum assisted device was used to perform core needle biopsy of the anterior extent of the 18.6 cm area of calcifications in the lateral left breast using a cephalad approach. Specimen radiograph was performed showing calcifications in 2 specimens.  Specimens with calcifications are identified for pathology. At the conclusion of the procedure, a ribbon shaped tissue marker clip was deployed into the biopsy cavity. Follow-up 2-view mammogram was performed and dictated separately. IMPRESSION: Stereotactic-guided biopsy of the posterior and anterior extents of the recently demonstrated 18.6 cm area of calcifications in the lateral left breast. No apparent complications. Electronically Signed: By: Claudie Revering M.D. On: 11/02/2021 09:17  MM CLIP PLACEMENT LEFT  Result Date: 11/02/2021 CLINICAL DATA:  Status post 3D stereotactic guided core needle biopsy of the posterior and anterior extents of a recently demonstrated 18.6 cm area of calcifications in the lateral left  breast. EXAM: 3D DIAGNOSTIC LEFT MAMMOGRAM POST STEREOTACTIC BIOPSY X 2 COMPARISON:  Previous exam(s). FINDINGS: 3D Mammographic images were obtained following 3D stereotactic guided biopsy of the posterior and anterior extents of recently demonstrated 18.6 cm area of calcifications in the lateral left breast. The biopsy marking clips are in the expected positions of the sites of biopsy. The clips are located 16.4 cm apart. IMPRESSION: Appropriate positioning of the coil shaped biopsy marking clip at the site of biopsy in the posterior outer left breast and appropriate positioning of the ribbon shaped clip in the anterior outer left breast. Final Assessment: Post Procedure Mammograms for Marker Placement Electronically Signed   By: Claudie Revering M.D.   On: 11/02/2021 09:38      IMPRESSION/PLAN: 1. High-grade, ER/PR positive DCIS of the left breast. Dr. Lisbeth Renshaw discusses the pathology findings and reviews the nature of early stage breast disease. The consensus from the breast conference includes breast conservation with lumpectomy *** external radiotherapy to the breast  to reduce risks of local recurrence followed by antiestrogen therapy. We discussed the risks, benefits, short, and long term  effects of radiotherapy, as well as the curative intent, and the patient is interested in proceeding. Dr. Lisbeth Renshaw discusses the delivery and logistics of radiotherapy and anticipates a course of *** weeks of radiotherapy. We will see her back a few weeks after surgery to discuss the simulation process and anticipate we starting radiotherapy about 4-6 weeks after surgery.  2. Possible genetic predisposition to malignancy. The patient is a candidate for genetic testing given her personal and family history. She will meet with our geneticist today in clinic. 3. Contraceptive counseling.  The patient has undergone bilateral tubal ligation in the past and does not need pregnancy testing prior to any radiotherapy if this were to be considered.  In a visit lasting *** minutes, greater than 50% of the time was spent face to face reviewing her case, as well as in preparation of, discussing, and coordinating the patient's care.  The above documentation reflects my direct findings during this shared patient visit. Please see the separate note by Dr. Lisbeth Renshaw on this date for the remainder of the patient's plan of care.    Carola Rhine, [facility]    **Disclaimer: This note was dictated with voice recognition software. Similar sounding words can inadvertently be transcribed and this note may contain transcription errors which may not have been corrected upon publication of note.**

## 2021-11-14 ENCOUNTER — Inpatient Hospital Stay (HOSPITAL_BASED_OUTPATIENT_CLINIC_OR_DEPARTMENT_OTHER): Payer: Medicaid Other | Admitting: Genetic Counselor

## 2021-11-14 ENCOUNTER — Ambulatory Visit: Payer: Medicaid Other | Attending: General Surgery | Admitting: Physical Therapy

## 2021-11-14 ENCOUNTER — Encounter: Payer: Self-pay | Admitting: Physical Therapy

## 2021-11-14 ENCOUNTER — Encounter: Payer: Self-pay | Admitting: Genetic Counselor

## 2021-11-14 ENCOUNTER — Other Ambulatory Visit: Payer: Self-pay

## 2021-11-14 ENCOUNTER — Encounter: Payer: Self-pay | Admitting: Hematology and Oncology

## 2021-11-14 ENCOUNTER — Encounter: Payer: Self-pay | Admitting: Radiology

## 2021-11-14 ENCOUNTER — Ambulatory Visit
Admission: RE | Admit: 2021-11-14 | Discharge: 2021-11-14 | Disposition: A | Discharge status: Home / Self Care | Payer: Medicaid Other | Source: Ambulatory Visit | Attending: Radiation Oncology | Admitting: Radiation Oncology

## 2021-11-14 ENCOUNTER — Inpatient Hospital Stay: Payer: Medicaid Other | Attending: Hematology and Oncology | Admitting: Licensed Clinical Social Worker

## 2021-11-14 ENCOUNTER — Inpatient Hospital Stay (HOSPITAL_BASED_OUTPATIENT_CLINIC_OR_DEPARTMENT_OTHER): Payer: Medicaid Other

## 2021-11-14 ENCOUNTER — Inpatient Hospital Stay (HOSPITAL_BASED_OUTPATIENT_CLINIC_OR_DEPARTMENT_OTHER): Payer: Medicaid Other | Admitting: Hematology and Oncology

## 2021-11-14 DIAGNOSIS — D0512 Intraductal carcinoma in situ of left breast: Secondary | ICD-10-CM

## 2021-11-14 DIAGNOSIS — Z79899 Other long term (current) drug therapy: Secondary | ICD-10-CM | POA: Diagnosis not present

## 2021-11-14 DIAGNOSIS — R293 Abnormal posture: Secondary | ICD-10-CM | POA: Insufficient documentation

## 2021-11-14 DIAGNOSIS — Z803 Family history of malignant neoplasm of breast: Secondary | ICD-10-CM

## 2021-11-14 DIAGNOSIS — N920 Excessive and frequent menstruation with regular cycle: Secondary | ICD-10-CM | POA: Insufficient documentation

## 2021-11-14 DIAGNOSIS — C50412 Malignant neoplasm of upper-outer quadrant of left female breast: Secondary | ICD-10-CM | POA: Insufficient documentation

## 2021-11-14 DIAGNOSIS — Z170 Estrogen receptor positive status [ER+]: Secondary | ICD-10-CM | POA: Diagnosis not present

## 2021-11-14 DIAGNOSIS — D500 Iron deficiency anemia secondary to blood loss (chronic): Secondary | ICD-10-CM | POA: Diagnosis present

## 2021-11-14 LAB — CBC WITH DIFFERENTIAL (CANCER CENTER ONLY)
Abs Immature Granulocytes: 0 10*3/uL (ref 0.00–0.07)
Basophils Absolute: 0 10*3/uL (ref 0.0–0.1)
Basophils Relative: 0 %
Eosinophils Absolute: 0 10*3/uL (ref 0.0–0.5)
Eosinophils Relative: 2 %
HCT: 34.6 % — ABNORMAL LOW (ref 36.0–46.0)
Hemoglobin: 11.5 g/dL — ABNORMAL LOW (ref 12.0–15.0)
Immature Granulocytes: 0 %
Lymphocytes Relative: 45 %
Lymphs Abs: 1.2 10*3/uL (ref 0.7–4.0)
MCH: 31.5 pg (ref 26.0–34.0)
MCHC: 33.2 g/dL (ref 30.0–36.0)
MCV: 94.8 fL (ref 80.0–100.0)
Monocytes Absolute: 0.2 10*3/uL (ref 0.1–1.0)
Monocytes Relative: 8 %
Neutro Abs: 1.2 10*3/uL — ABNORMAL LOW (ref 1.7–7.7)
Neutrophils Relative %: 45 %
Platelet Count: 234 10*3/uL (ref 150–400)
RBC: 3.65 MIL/uL — ABNORMAL LOW (ref 3.87–5.11)
RDW: 12.9 % (ref 11.5–15.5)
WBC Count: 2.6 10*3/uL — ABNORMAL LOW (ref 4.0–10.5)
nRBC: 0 % (ref 0.0–0.2)

## 2021-11-14 LAB — GENETIC SCREENING ORDER

## 2021-11-14 LAB — CMP (CANCER CENTER ONLY)
ALT: 8 U/L (ref 0–44)
AST: 11 U/L — ABNORMAL LOW (ref 15–41)
Albumin: 4.1 g/dL (ref 3.5–5.0)
Alkaline Phosphatase: 43 U/L (ref 38–126)
Anion gap: 3 — ABNORMAL LOW (ref 5–15)
BUN: 15 mg/dL (ref 6–20)
CO2: 32 mmol/L (ref 22–32)
Calcium: 9.1 mg/dL (ref 8.9–10.3)
Chloride: 106 mmol/L (ref 98–111)
Creatinine: 1.03 mg/dL — ABNORMAL HIGH (ref 0.44–1.00)
GFR, Estimated: >60 mL/min (ref >60)
Glucose, Bld: 74 mg/dL (ref 70–99)
Potassium: 3.9 mmol/L (ref 3.5–5.1)
Sodium: 141 mmol/L (ref 135–145)
Total Bilirubin: 0.9 mg/dL (ref 0.3–1.2)
Total Protein: 6.4 g/dL — ABNORMAL LOW (ref 6.5–8.1)

## 2021-11-14 NOTE — Research (Signed)
MTG-015 - Tissue and Bodily Fluids: Translational Medicine: Discovery and Evaluation of Biomarkers/Pharmacogenomics for the Diagnosis and Personalized Management of Patients   11/14/2021  CONSENT INTRO:  Patient Casey Lang was identified by Dr. Lindi Adie as a potential candidate for the above listed study.  This Clinical Research Coordinator met with Casey Lang, [id number], on 11/14/21 in a manner and location that ensures patient privacy to discuss participation in the above listed research study.  Patient is Accompanied by her family .  A copy of the informed consent document and separate HIPAA Authorization was provided to the patient.  Patient reads, speaks, and understands [place].   Patient was provided with the business card of this Coordinator and encouraged to contact the research team with any questions.  Approximately 10 minutes were spent with the patient reviewing the informed consent documents.  Patient was provided the option of taking informed consent documents home to review and was encouraged to review at their convenience with their support network, including other care providers. Patient took the consent documents home to review.  Will call patient next week to confirm potential interest. Patient thanked for her time and consideration of the above mentioned study.   Carol Ada, RT(R)(T) Clinical Research Coordinator

## 2021-11-14 NOTE — Progress Notes (Signed)
[place] Work  Initial Assessment   Casey Lang is a 41 y.o. year old female accompanied by mother and father (Bea & Jenny Reichmann). Clinical Social Work was referred by  [facility]  for assessment of psychosocial needs.   SDOH (Social Determinants of Health) assessments performed: Yes SDOH Interventions    Flowsheet Row Clinical Support from 11/14/2021 in [facility]  SDOH Interventions   Food Insecurity Interventions Intervention Not Indicated  Housing Interventions Intervention Not Indicated  Transportation Interventions Intervention Not Indicated  Financial Strain Interventions Intervention Not Indicated       SDOH Screenings   Food Insecurity: No Food Insecurity (11/14/2021)  Housing: Low Risk  (11/14/2021)  Transportation Needs: No Transportation Needs (11/14/2021)  Depression (PHQ2-9): Low Risk  (07/17/2021)  Financial Resource Strain: Low Risk  (11/14/2021)  Tobacco Use: Low Risk  (11/14/2021)     Distress Screen completed: Yes    11/14/2021    1:19 PM  ONCBCN DISTRESS SCREENING  Screening Type Initial Screening  Distress experienced in past week (1-10) 4  Practical problem type Insurance  Emotional problem type Adjusting to illness  Information Concerns Type Lack of info about diagnosis;Lack of info about treatment      Family/Social Information:  Housing Arrangement: patient lives with son, [place] Family members/support persons in your life? Family (parents), Friends, and Development worker, community concerns: no  Employment: working as Product/process development scientist. Plans to look for another full-time position around November if possible.  Income source: Employment Financial concerns:  Potentially if insurance expires before she finds a new job Type of concern:  Land access concerns: no Religious or spiritual practice: Carter Kitten is helpful in her coping Services Currently in place:    Coping/ Adjustment to  diagnosis: Patient understands treatment plan and what happens next? yes, wants to have mastectomy as soon as she can to allow for finding a new job Concerns about diagnosis and/or treatment:  Medicaid is ending in November. Concerned about medical costs/insurance if she doesn't find a new job before then Patient reported stressors: Insurance and Adjusting to my illness Patient enjoys  time with her son Current coping skills/ strengths: Ability for insight , Capable of independent living , Armed forces logistics/support/administrative officer , Motivation for treatment/growth , Religious Affiliation , and Supportive family/friends     SUMMARY: Current SDOH Barriers:  Potential insurance concerns  Clinical Social Work Clinical Goal(s):  Patient will work with [employer] program to address needs related to insurance if treatment delays starting a new job  Interventions: Discussed common feeling and emotions when being diagnosed with cancer, and the importance of support during treatment Informed patient of the support team roles and support services at [facility] Provided [place] contact information and encouraged patient to call with any questions or concerns Provided information on BCCCP   Follow Up Plan: Patient will contact CSW with any support or resource needs Patient verbalizes understanding of plan: Yes    Mario Voong E [place], LCSW

## 2021-11-14 NOTE — Progress Notes (Addendum)
REFERRING PROVIDER: Nicholas Lose, MD  PRIMARY PROVIDER:  Jeanie Sewer, NP  PRIMARY REASON FOR VISIT:  Encounter Diagnoses  Name Primary?   Ductal carcinoma in situ (DCIS) of left breast Yes   Family history of breast cancer     HISTORY OF PRESENT ILLNESS:   Casey Lang, a 41 y.o. female, was seen for a [place] cancer genetics consultation during the breast multidisciplinary clinic at the request of Dr. Lindi Adie due to a personal and family history of cancer.  Casey Lang presents to clinic today to discuss the possibility of a hereditary predisposition to cancer, to discuss genetic testing, and to further clarify her future cancer risks, as well as potential cancer risks for family members.   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 Casey Lang plans to have bilateral mastectomies.   CANCER HISTORY:  Oncology History  Ductal carcinoma in situ (DCIS) of left breast  11/02/2021 Initial Diagnosis   Screening mammogram detected left breast calcifications measuring 18.6 cm in span.  2 biopsies were performed both anterior and posterior revealed high-grade DCIS with solid cribriform and micropapillary features with necrosis ER 95%, PR 80% to 50%   11/14/2021 Cancer Staging   Staging form: Breast, AJCC 8th Edition - Clinical: Stage 0 (cTis (DCIS), cN0, cM0, G3, ER+, PR+, HER2: Not Assessed) - Signed by Nicholas Lose, MD on 11/14/2021 Histologic grading system: 3 grade system      RISK FACTORS:  Menarche was at age 55.  First live birth at age 50.  OCP use for approximately 4 years.  Ovaries intact: yes.  Uterus intact: yes.  Menopausal status: premenopausal.  HRT use: 0 years. Colonoscopy: no Mammogram within the last year: yes. 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   Depression    Gestational hypertension    Hypertension    pregnancy induced hypertension   Leukopenia 10/19/2021   Mental disorder    PTSD (post-traumatic stress disorder)    Right foot injury 10/09/2012   Resolved without complication    Past Surgical History:  Procedure Laterality Date   FOOT SURGERY     HEMATOMA EVACUATION N/A 07/18/2016   Procedure: EVACUATION HEMATOMA;  Surgeon: Donnamae Jude, MD;  Location: [place];  Service: Gynecology;  Laterality: N/A;   LAPAROSCOPIC GASTRIC SLEEVE RESECTION  10/09/2012   Surgery was on 09/30/2011   TUBAL LIGATION Bilateral 07/18/2016   Procedure: POST PARTUM TUBAL LIGATION;  Surgeon: Donnamae Jude, MD;  Location: [place];  Service: Gynecology;  Laterality: Bilateral;    Social History   Socioeconomic History   Marital status: Single    Spouse name: Not on file   Number of children: 1   Years of education: 4 years college   Highest education level: Bachelor's degree (e.g., BA, AB, BS)  Occupational History   Occupation: nurse  Tobacco Use   Smoking status: Never   Smokeless tobacco: Never  Vaping Use   Vaping Use: Never used  Substance and Sexual Activity   Alcohol use: No   Drug use: No   Sexual activity: Not Currently    Birth control/protection: Surgical  Other Topics Concern   Not on file  Social History Narrative   Not  on file   Social Determinants of Health   Financial Resource Strain: Not on file  Food Insecurity: Not on file  Transportation Needs: Not on file  Physical Activity: Not on file  Stress: Not on file  Social Connections: Not on file     FAMILY HISTORY:  We obtained a detailed, 4-generation family history.  Significant diagnoses are listed below: Family History  Problem Relation Age of Onset   Hypertension Mother    Diabetes Mother    Hyperlipidemia Mother    Diabetes Father    Hypertension Father    Asthma Brother    Colon  cancer Paternal Uncle 85 - 35   Hypertension Maternal Grandmother    Stroke Maternal Grandmother    Aneurysm Maternal Grandmother    Arthritis Maternal Grandmother    Heart attack Maternal Grandmother    Hypercholesterolemia Maternal Grandmother    Colon cancer Maternal Grandfather 84 - 69   Alzheimer's disease Paternal Grandmother    Breast cancer Paternal Grandmother 53 - 50   Anxiety disorder Other    Depression Other    Seizures Neg Hx      Casey Lang's maternal grandfather was diagnosed with CLL in his 58s, he is deceased. Her maternal great aunt (grandfather's sister) was diagnosed with colon cancer, she is deceased. Her maternal great uncle (grandfather's brother) was diagnosed with a brain tumor, he is deceased.   Casey Lang paternal uncle was diagnosed with colon cancer in his 51s. Her paternal grandmother was diagnosed with breast cancer in her 41s, she is deceased.   Casey Lang is unaware of previous family history of genetic testing for hereditary cancer risks. There is no reported Ashkenazi Jewish ancestry.   GENETIC COUNSELING ASSESSMENT: Casey Lang is a 41 y.o. female with a personal and family history of cancer which is somewhat suggestive of a hereditary cancer syndrome and predisposition to cancer given her young age at diagnosis. We, therefore, discussed and recommended the following at today's visit.   DISCUSSION: We discussed that 5 - 10% of cancer is hereditary, with most cases of hereditary breast cancer associated with mutations in BRCA1/2.  There are other genes that can be associated with hereditary breast cancer syndromes. Type of cancer risk and level of risk are gene-specific. We discussed that testing is beneficial for several reasons including knowing how to follow individuals after completing their treatment, identifying whether potential treatment options would be beneficial, and understanding if other family members could be at risk for cancer and  allowing them to undergo genetic testing.   We reviewed the characteristics, features and inheritance patterns of hereditary cancer syndromes. We also discussed genetic testing, including the appropriate family members to test, the process of testing, insurance coverage and turn-around-time for results. We discussed the implications of a negative, positive and/or variant of uncertain significant result.   Casey Lang elected to have Lang Panel. The CustomNext-Cancer+RNAinsight panel offered by [facility] includes sequencing and rearrangement analysis for the following 47 genes:  APC, ATM, AXIN2, BARD1, BMPR1A, BRCA1, BRCA2, BRIP1, CDH1, CDK4, CDKN2A, CHEK2, CTNNA1, DICER1, EPCAM, GREM1, HOXB13, KIT, MEN1, MLH1, MSH2, MSH3, MSH6, MUTYH, NBN, NF1, NTHL1, PALB2, PDGFRA, PMS2, POLD1, POLE, PTEN, RAD50, RAD51C, RAD51D, SDHA, SDHB, SDHC, SDHD, SMAD4, SMARCA4, STK11, TP53, TSC1, TSC2, and VHL.  RNA data is routinely analyzed for use in variant interpretation for all genes.  Based on Casey Lang's personal and family history of cancer, she meets medical criteria for genetic testing. Despite that she meets criteria, she  may still have an out of pocket cost. We discussed that if her out of pocket cost for testing is over $100, the laboratory should contact them to discuss self-pay prices, patient pay assistance programs, if applicable, and other billing options.   PLAN: After considering the risks, benefits, and limitations, Casey Lang provided informed consent to pursue genetic testing and the blood sample was sent to [facility] for analysis of the CustomNext Panel. Results should be available within approximately 1-2 weeks' time, at which point they will be disclosed by telephone to Casey Lang, as will any additional recommendations warranted by these results. Casey Lang will receive a summary of her genetic counseling visit and a copy of her results once available. This information  will also be available in Epic.   Casey Lang questions were answered to her satisfaction today. Our contact information was provided should additional questions or concerns arise. Thank you for the referral and allowing [place] to share in the care of your patient.   Casey Passy, MS, [facility] Genetic Counselor [email or web address] (P) [phone]  The patient was seen for a total of 20 minutes in face-to-face genetic counseling.  The patient brought her parents. Drs. Lindi Adie and/or Burr Medico were available to discuss this case as needed.  _______________________________________________________________________ For Office Staff:  Number of people involved in session: 3 Was an Intern/ student involved with case: no

## 2021-11-14 NOTE — Assessment & Plan Note (Addendum)
11/02/2021:Screening mammogram detected left breast calcifications measuring 18.6 cm in span.  2 biopsies were performed both anterior and posterior revealed high-grade DCIS with solid cribriform and micropapillary features with necrosis ER 95%, PR 80% to 50%  Pathology review: I discussed with the patient the difference between DCIS and invasive breast cancer. It is considered a precancerous lesion. DCIS is classified as a 0. It is generally detected through mammograms as calcifications. We discussed the significance of grades and its impact on prognosis. We also discussed the importance of ER and PR receptors and their implications to adjuvant treatment options. Prognosis of DCIS dependence on grade, comedo necrosis. It is anticipated that if not treated, 20-30% of DCIS can develop into invasive breast cancer.  Recommendation: Patient wishes to do bilateral mastectomies No role of adjuvant treatment if she does bilateral mastectomies.  Return to clinic after surgery to discuss the final pathology report

## 2021-11-14 NOTE — Therapy (Signed)
OUTPATIENT PHYSICAL THERAPY BREAST CANCER BASELINE EVALUATION   Patient Name: Casey Lang MRN: [id number] DOB:[birth year], 41 y.o., female Today's Date: 11/14/2021   PT End of Session - 11/14/21 1134     Visit Number 1    Number of Visits 2    Date for PT Re-Evaluation 01/09/22    PT Start Time 0940    PT Stop Time 69   Aalso saw pt from [phone] for a total of 40 min   PT Time Calculation (min) 22 min    Activity Tolerance Patient tolerated treatment well    Behavior During Therapy [facility] for tasks assessed/performed             Past Medical History:  Diagnosis Date   Anxiety    Bipolar 1 disorder ([place])    Breast cancer ([place])    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   Depression    Gestational hypertension    Hypertension    pregnancy induced hypertension   Leukopenia 10/19/2021   Mental disorder    PTSD (post-traumatic stress disorder)    Right foot injury 10/09/2012   Resolved without complication   Past Surgical History:  Procedure Laterality Date   FOOT SURGERY     HEMATOMA EVACUATION N/A 07/18/2016   Procedure: EVACUATION HEMATOMA;  Surgeon: Donnamae Jude, MD;  Location: [place];  Service: Gynecology;  Laterality: N/A;   LAPAROSCOPIC GASTRIC SLEEVE RESECTION  10/09/2012   Surgery was on 09/30/2011   TUBAL LIGATION Bilateral 07/18/2016   Procedure: POST PARTUM TUBAL LIGATION;  Surgeon: Donnamae Jude, MD;  Location: [place];  Service: Gynecology;  Laterality: Bilateral;   Patient Active Problem List   Diagnosis Date Noted   Ductal carcinoma in situ (DCIS) of left breast 11/12/2021   Leukopenia 10/19/2021   Iron deficiency anemia due to chronic blood loss 06/11/2021   Gestational hypertension 07/17/2016   Gestational hypertension, third trimester 05/03/2016   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 Autumn Messing  REFERRING DIAG: Left breast cancer  THERAPY DIAG:  Malignant neoplasm of upper-outer quadrant of left breast in female, estrogen receptor positive ([place])  Abnormal posture  Rationale for Evaluation and Treatment Rehabilitation  ONSET DATE: 08/24/2021  SUBJECTIVE  SUBJECTIVE STATEMENT: Patient reports she is here today to be seen by her medical team for her newly diagnosed left breast cancer.   PERTINENT HISTORY:  Patient was diagnosed on 08/24/2021 with left high grade DCIS. It measures 18.6 cm and is located in the upper outer quadrant. It is ER/PR positive.   PATIENT GOALS   reduce lymphedema risk and learn post op HEP.   PAIN:  Are you having pain? No   PRECAUTIONS: Active CA   HAND DOMINANCE: right  WEIGHT BEARING RESTRICTIONS No  FALLS:  Has patient fallen in last 6 months? No  LIVING ENVIRONMENT: Patient lives with: her 41 y.o. 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  LEISURE: She exercises 6 days/week doing 50 min of cardio (bike/elliptical) and some weight training.  PRIOR LEVEL OF FUNCTION: Independent   OBJECTIVE  COGNITION:  Overall cognitive status: Within functional limits for tasks assessed    POSTURE:  Forward head and rounded shoulders posture  UPPER EXTREMITY AROM/PROM:  A/PROM RIGHT   eval   Shoulder extension 51  Shoulder flexion 162  Shoulder abduction 177  Shoulder internal  rotation 75  Shoulder external rotation 80    (Blank rows = not tested)  A/PROM LEFT   eval  Shoulder extension 62  Shoulder flexion 152  Shoulder abduction 176  Shoulder internal rotation 85  Shoulder external rotation 62    (Blank rows = not tested)   CERVICAL AROM: All within normal limits  UPPER EXTREMITY STRENGTH: WNL   LYMPHEDEMA ASSESSMENTS:   LANDMARK RIGHT   eval  10 cm proximal to olecranon process 31.8  Olecranon process 25.6  10 cm proximal to ulnar styloid process 23.6  Just proximal to ulnar styloid process 15.8  Across hand at thumb web space 19.9  At base of 2nd digit 6.2  (Blank rows = not tested)  LANDMARK LEFT   eval  10 cm proximal to olecranon process 32.3  Olecranon process 25.8  10 cm proximal to ulnar styloid process 23.3  Just proximal to ulnar styloid process 15.7  Across hand at thumb web space 20.1  At base of 2nd digit 6  (Blank rows = not tested)   L-DEX LYMPHEDEMA SCREENING:  The patient was assessed using the L-Dex machine today to produce a lymphedema index baseline score. The patient will be reassessed on a regular basis (typically every 3 months) to obtain new L-Dex scores. If the score is > 6.5 points away from his/her baseline score indicating onset of subclinical lymphedema, it will be recommended to wear a compression garment for 4 weeks, 12 hours per day and then be reassessed. If the score continues to be > 6.5 points from baseline at reassessment, we will initiate lymphedema treatment. Assessing in this manner has a 95% rate of preventing clinically significant lymphedema.   L-DEX FLOWSHEETS - 11/14/21 1100       L-DEX LYMPHEDEMA SCREENING   Measurement Type Unilateral    L-DEX MEASUREMENT EXTREMITY Upper Extremity    POSITION  Standing    DOMINANT SIDE Right    At Risk Side Left    BASELINE SCORE (UNILATERAL) 3.5              QUICK DASH SURVEY:  Katina Dung - 11/14/21 0001     Open a tight or new jar No  difficulty    Do heavy household  chores (wash walls, wash floors) No difficulty    Carry a shopping bag or briefcase No difficulty    Wash your back No difficulty    Use a knife to cut food No difficulty    Recreational activities in which you take some force or impact through your arm, shoulder, or hand (golf, hammering, tennis) No difficulty    During the past week, to what extent has your arm, shoulder or hand problem interfered with your normal social activities with family, friends, neighbors, or groups? Not at all    During the past week, to what extent has your arm, shoulder or hand problem limited your work or other regular daily activities Not at all    Arm, shoulder, or hand pain. None    Tingling (pins and needles) in your arm, shoulder, or hand None    Difficulty Sleeping No difficulty    DASH Score 0 %              PATIENT EDUCATION:  Education details: Lymphedema risk reduction and post op shoulder/posture HEP Person educated: Patient Education method: Explanation, Demonstration, Handout Education comprehension: Patient verbalized understanding and returned demonstration   HOME EXERCISE PROGRAM: Patient was instructed today in a home exercise program today for post op shoulder range of motion. These included active assist shoulder flexion in sitting, scapular retraction, wall walking with shoulder abduction, and hands behind head external rotation.  She was encouraged to do these twice a day, holding 3 seconds and repeating 5 times when permitted by her physician.   ASSESSMENT:  CLINICAL IMPRESSION: Patient was diagnosed on 08/24/2021 with left high grade DCIS. It measures 18.6 cm and is located in the upper outer quadrant. It is ER/PR positive. Her multidisciplinary medical team met prior to her assessments to determine a recommended treatment plan. She is planning to have bilateral mastectomies with a left sentinel node biopsy and anti-estrogen therapy. She will  benefit from a post op PT reassessment to determine needs and from L-Dex screens every 3 months for 2 years to detect subclinical lymphedema.  Pt will benefit from skilled therapeutic intervention to improve on the following deficits: Decreased knowledge of precautions, impaired UE functional use, pain, decreased ROM, postural dysfunction.   PT treatment/interventions: ADL/self-care home management, pt/family education, therapeutic exercise  REHAB POTENTIAL: Excellent  CLINICAL DECISION MAKING: Stable/uncomplicated  EVALUATION COMPLEXITY: Low   GOALS: Goals reviewed with patient? YES  LONG TERM GOALS: (STG=LTG)    Name Target Date Goal status  1 Pt will be able to verbalize understanding of pertinent lymphedema risk reduction practices relevant to her dx specifically related to skin care.  Baseline:  No knowledge 11/14/2021 Achieved at eval  2 Pt will be able to return demo and/or verbalize understanding of the post op HEP related to regaining shoulder ROM. Baseline:  No knowledge 11/14/2021 Achieved at eval  3 Pt will be able to verbalize understanding of the importance of attending the post op After Breast CA Class for further lymphedema risk reduction education and therapeutic exercise.  Baseline:  No knowledge 11/14/2021 Achieved at eval  4 Pt will demo she has regained full shoulder ROM and function post operatively compared to baselines.  Baseline: See objective measurements taken today. 01/09/2022      PLAN: PT FREQUENCY/DURATION: EVAL and 1 follow up appointment.   PLAN FOR NEXT SESSION: will reassess 3-4 weeks post op to determine needs.   Patient will follow up at outpatient cancer rehab 3-4 weeks following surgery.  If  the patient requires physical therapy at that time, a specific plan will be dictated and sent to the referring physician for approval. The patient was educated today on appropriate basic range of motion exercises to begin post operatively and the importance  of attending the After Breast Cancer class following surgery.  Patient was educated today on lymphedema risk reduction practices as it pertains to recommendations that will benefit the patient immediately following surgery.  She verbalized good understanding.    Physical Therapy Information for After Breast Cancer Surgery/Treatment:  Lymphedema is a swelling condition that you may be at risk for in your arm if you have lymph nodes removed from the armpit area.  After a sentinel node biopsy, the risk is approximately 5-9% and is higher after an axillary node dissection.  There is treatment available for this condition and it is not life-threatening.  Contact your physician or physical therapist with concerns. You may begin the 4 shoulder/posture exercises (see additional sheet) when permitted by your physician (typically a week after surgery).  If you have drains, you may need to wait until those are removed before beginning range of motion exercises.  A general recommendation is to not lift your arms above shoulder height until drains are removed.  These exercises should be done to your tolerance and gently.  This is not a "no pain/no gain" type of recovery so listen to your body and stretch into the range of motion that you can tolerate, stopping if you have pain.  If you are having immediate reconstruction, ask your plastic surgeon about doing exercises as he or she may want you to wait. We encourage you to attend the free one time ABC (After Breast Cancer) class offered by [facility].  You will learn information related to lymphedema risk, prevention and treatment and additional exercises to regain mobility following surgery.  You can call [phone] for more information.  This is offered the 1st and 3rd Monday of each month.  You only attend the class one time. While undergoing any medical procedure or treatment, try to avoid blood pressure being taken or needle sticks from  occurring on the arm on the side of cancer.   This recommendation begins after surgery and continues for the rest of your life.  This may help reduce your risk of getting lymphedema (swelling in your arm). An excellent resource for those seeking information on lymphedema is the National Lymphedema Network's web site. It can be accessed at [place].org If you notice swelling in your hand, arm or breast at any time following surgery (even if it is many years from now), please contact your doctor or physical therapist to discuss this.  Lymphedema can be treated at any time but it is easier for you if it is treated early on.  If you feel like your shoulder motion is not returning to normal in a reasonable amount of time, please contact your surgeon or physical therapist.  [place] [phone]. [address], [address], [place] [address]  ABC CLASS After Breast Cancer Class  After Breast Cancer Class is a specially designed exercise class to assist you in a safe recover after having breast cancer surgery.  In this class you will learn how to get back to full function whether your drains were just removed or if you had surgery a month ago.  This one-time class is held the 1st and 3rd Monday of every month from 11:00 a.m. until 12:00  noon virtually.  This class is FREE and space is limited. For more information or to register for the next available class, call [phone].  Class Goals  Understand specific stretches to improve the flexibility of you chest and shoulder. Learn ways to safely strengthen your upper body and improve your posture. Understand the warning signs of infection and why you may be at risk for an arm infection. Learn about Lymphedema and prevention.  ** You do not attend this class until after surgery.  Drains must be removed to participate  Patient was instructed today in a home exercise program today for post op shoulder range of  motion. These included active assist shoulder flexion in sitting, scapular retraction, wall walking with shoulder abduction, and hands behind head external rotation.  She was encouraged to do these twice a day, holding 3 seconds and repeating 5 times when permitted by her physician.  Annia Friendly, [place] 11/14/21 11:43 AM

## 2021-11-14 NOTE — Progress Notes (Signed)
[facility] NOTE  Patient Care Team: Jeanie Sewer, NP as PCP - General (Family Medicine) Sheralyn Boatman, MD as Consulting Physician (Psychiatry) Rockwell Germany, RN as Oncology Nurse Navigator Tressie Ellis, Paulette Blanch, RN as Oncology Nurse Navigator  CHIEF COMPLAINTS/PURPOSE OF CONSULTATION:  Newly diagnosed breast cancer  HISTORY OF PRESENTING ILLNESS:  Casey Lang 41 y.o. female is here because of recent diagnosis of left breast DCIS.  Patient had a routine mammogram that shows calcifications in the left breast measuring 18.6 cm.  She had 2 biopsies both of which came back as high-grade DCIS that was ER/PR positive.  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  She has missed a few years of mammograms.  I reviewed her records extensively and collaborated the history with the patient.  SUMMARY OF ONCOLOGIC HISTORY: Oncology History  Ductal carcinoma in situ (DCIS) of left breast  11/02/2021 Initial Diagnosis   Screening mammogram detected left breast calcifications measuring 18.6 cm in span.  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;  Service: Gynecology;  Laterality: N/A;   LAPAROSCOPIC GASTRIC SLEEVE RESECTION  10/09/2012   Surgery was on 09/30/2011   TUBAL LIGATION Bilateral 07/18/2016   Procedure: POST PARTUM TUBAL LIGATION;  Surgeon: Donnamae Jude, MD;  Location: [facility];  Service: Gynecology;  Laterality: Bilateral;    SOCIAL HISTORY: Social History   Socioeconomic History   Marital status: Single    Spouse name: Not on file   Number of children: 1   Years of education: 4 years college   Highest education level: Bachelor's degree (e.g., BA, AB, BS)  Occupational History   Occupation: nurse  Tobacco Use   Smoking status: Never   Smokeless tobacco: Never  Vaping Use   Vaping Use: Never used  Substance and Sexual Activity   Alcohol use: No   Drug use: No   Sexual activity: Not Currently    Birth control/protection: Surgical  Other Topics Concern   Not on file  Social History Narrative   Not on file   Social Determinants of Health   Financial Resource Strain: Not on file  Food Insecurity: Not on file  Transportation Needs: Not on file  Physical Activity: Not on file  Stress: Not on file  Social Connections: Not on file  Intimate Partner Violence: Not on file    FAMILY HISTORY: Family History  Problem Relation Age of Onset   Hypertension Mother    Diabetes Mother    Hyperlipidemia Mother    Diabetes Father    Hypertension Father    Asthma Brother    Hypertension Maternal Grandmother    Stroke Maternal Grandmother    Aneurysm Maternal Grandmother    Arthritis Maternal Grandmother    Heart attack Maternal Grandmother    Hypercholesterolemia Maternal Grandmother    Cancer Paternal Grandmother    Alzheimer's disease Paternal Grandmother    Breast cancer Paternal Grandmother    Anxiety  disorder Other    Depression Other    Seizures Neg Hx     ALLERGIES:  is allergic to morphine and related.  MEDICATIONS:  Current Outpatient Medications  Medication Sig Dispense Refill   Biotin (YUMVS BIOTIN MAX POTENCY) 5000 MCG CHEW Chew by mouth.     Calcium Carb-Cholecalciferol (OYSTER SHELL CALCIUM W/D) 500-5 MG-MCG TABS Take by mouth.     ferrous sulfate 325 (65 FE) MG tablet Take 325 mg by mouth daily with breakfast.     gabapentin (NEURONTIN) 300 MG capsule Take 300 mg by mouth at bedtime.     Lactobacillus Rhamnosus, GG, (CULTURELLE) CAPS Take 1 capsule by mouth daily.     Multiple Vitamin (MULTI-VITAMIN) tablet Take 1 tablet by mouth daily.     Omega-3 1000 MG CAPS Take by mouth.     QUEtiapine (SEROQUEL) 25 MG tablet Take 25 mg by mouth. Three tabs daily /total of 75 mg per patient     Turmeric 400 MG CAPS Take by mouth.     No current facility-administered medications for this visit.    REVIEW OF SYSTEMS:   Constitutional: Denies fevers, chills or abnormal night sweats Breast:  Denies any palpable lumps or discharge All other systems were reviewed with the patient and are negative.  PHYSICAL EXAMINATION: ECOG PERFORMANCE STATUS: 0 - Asymptomatic  Vitals:   11/14/21 0830  BP: (!) 135/92  Pulse: 71  Resp: 18  Temp: 97.7 F (36.5 C)  SpO2: 100%   Filed Weights   11/14/21 0830  Weight: 230 lb 9.6 oz (104.6 kg)    GENERAL:alert, no distress and comfortable    LABORATORY DATA:  I have reviewed the data as listed Lab Results  Component Value Date   WBC 2.6 (L) 11/14/2021   HGB 11.5 (L) 11/14/2021   HCT 34.6 (L) 11/14/2021   MCV 94.8 11/14/2021   PLT 234 11/14/2021   Lab Results  Component Value Date   NA 141 11/14/2021   K 3.9 11/14/2021   CL 106 11/14/2021   CO2 32 11/14/2021    RADIOGRAPHIC STUDIES: I have personally reviewed the radiological reports and agreed with the findings in the report.  ASSESSMENT AND PLAN:  Ductal carcinoma in situ  (DCIS) of left breast 11/02/2021:Screening mammogram detected left breast calcifications measuring 18.6 cm in span.  2 biopsies were performed both anterior and posterior revealed high-grade DCIS with solid cribriform and micropapillary features with necrosis ER 95%, PR 80% to 50%  Pathology review: I discussed with the patient the difference between DCIS and invasive breast cancer. It is considered a precancerous lesion. DCIS is classified as a 0. It is generally detected through mammograms as calcifications. We discussed the significance of grades and its impact on prognosis. We also discussed the importance of ER and PR receptors and their implications to adjuvant treatment options. Prognosis of DCIS dependence on grade, comedo necrosis. It is anticipated that if not treated, 20-30%  of DCIS can develop into invasive breast cancer.  Recommendation: Patient wishes to do bilateral mastectomies No role of adjuvant treatment if she does bilateral mastectomies.  Return to clinic after surgery to discuss the final pathology report   All questions were answered. The patient knows to call the clinic with any problems, questions or concerns.    Harriette Ohara, MD 11/14/21

## 2021-11-15 ENCOUNTER — Ambulatory Visit: Payer: Self-pay | Admitting: General Surgery

## 2021-11-20 ENCOUNTER — Other Ambulatory Visit: Payer: Self-pay

## 2021-11-20 ENCOUNTER — Telehealth: Payer: Self-pay

## 2021-11-20 ENCOUNTER — Encounter: Payer: Self-pay | Admitting: *Deleted

## 2021-11-20 ENCOUNTER — Telehealth: Payer: Self-pay | Admitting: *Deleted

## 2021-11-20 ENCOUNTER — Encounter (HOSPITAL_BASED_OUTPATIENT_CLINIC_OR_DEPARTMENT_OTHER): Payer: Self-pay | Admitting: General Surgery

## 2021-11-20 NOTE — Telephone Encounter (Signed)
Return call to pt, pt asking if her WBC is concerning.  I informed pt that labs had been reviewed with MD previously and no outstanding concerns noted.  Pt verbalized understanding

## 2021-11-20 NOTE — Telephone Encounter (Signed)
MTG-015 - Tissue and Bodily Fluids: Translational Medicine: Discovery and Evaluation of Biomarkers/Pharmacogenomics for the Diagnosis and Personalized Management of Patients   Called Ms Croucher to discuss research participation. She is not able to speak now and will call me back when she has time to talk. Gave her my direct line and let her know that I will be unavailable tomorrow after 1200 due to a previous patient appt.  Marjie Skiff Martin Smeal, RN, BSN, [facility] She  Her  [facility] Clinical Research Nurse [place] [phone]  Pager [phone] 11/20/2021 1:23 PM

## 2021-11-20 NOTE — Telephone Encounter (Signed)
Left vm regarding [facility] from 11/14/21. Contact information provided for questions or needs. 

## 2021-11-26 ENCOUNTER — Encounter: Payer: Self-pay | Admitting: Genetic Counselor

## 2021-11-26 ENCOUNTER — Telehealth: Payer: Self-pay | Admitting: Genetic Counselor

## 2021-11-26 DIAGNOSIS — Z1379 Encounter for other screening for genetic and chromosomal anomalies: Secondary | ICD-10-CM | POA: Insufficient documentation

## 2021-11-26 NOTE — Telephone Encounter (Signed)
I contacted [name] to discuss her genetic testing results. No pathogenic variants were identified in the 47 genes analyzed. Detailed clinic note to follow.  The test report has been scanned into EPIC and is located under the Molecular Pathology section of the Results Review tab.  A portion of the result report is included below for reference.   Lucille Passy, MS, [facility] Genetic Counselor [email or web address] (P) [phone]

## 2021-11-27 NOTE — Progress Notes (Signed)

## 2021-11-28 ENCOUNTER — Ambulatory Visit: Payer: Self-pay | Admitting: Genetic Counselor

## 2021-11-28 DIAGNOSIS — Z1379 Encounter for other screening for genetic and chromosomal anomalies: Secondary | ICD-10-CM

## 2021-11-28 NOTE — Anesthesia Preprocedure Evaluation (Signed)
Anesthesia Evaluation  Patient identified by MRN, date of birth, ID band Patient awake    Reviewed: Allergy & Precautions, NPO status , Patient's Chart, lab work & pertinent test results  History of Anesthesia Complications Negative for: history of anesthetic complications  Airway Mallampati: II  TM Distance: >3 FB Neck ROM: Full    Dental no notable dental hx.    Pulmonary neg pulmonary ROS,    Pulmonary exam normal        Cardiovascular negative cardio ROS Normal cardiovascular exam     Neuro/Psych Anxiety Depression Bipolar Disorder pseudoseizuresnegative neurological ROS     GI/Hepatic negative GI ROS, Neg liver ROS,   Endo/Other  negative endocrine ROS  Renal/GU negative Renal ROS  negative genitourinary   Musculoskeletal negative musculoskeletal ROS (+)   Abdominal   Peds  Hematology  (+) Blood dyscrasia (Hgb 11.5), anemia ,   Anesthesia Other Findings Breast ca  Reproductive/Obstetrics negative OB ROS                           Anesthesia Physical Anesthesia Plan  ASA: 3  Anesthesia Plan: General   Post-op Pain Management: Tylenol PO (pre-op)*, Regional block*, Ketamine IV*, Dilaudid IV, Gabapentin PO (pre-op)* and Celebrex PO (pre-op)*   Induction: Intravenous  PONV Risk Score and Plan: 3 and Treatment may vary due to age or medical condition, Ondansetron, Dexamethasone, Midazolam, Propofol infusion and Scopolamine patch - Pre-op  Airway Management Planned: Oral ETT  Additional Equipment: None  Intra-op Plan:   Post-operative Plan: Extubation in OR  Informed Consent: I have reviewed the patients History and Physical, chart, labs and discussed the procedure including the risks, benefits and alternatives for the proposed anesthesia with the patient or authorized representative who has indicated his/her understanding and acceptance.     Dental advisory given  Plan  Discussed with: CRNA  Anesthesia Plan Comments:        Anesthesia Quick Evaluation

## 2021-11-28 NOTE — Progress Notes (Signed)
HPI:   Casey Lang was previously seen in the [facility] clinic due to a personal and family history of cancer and concerns regarding a hereditary predisposition to cancer. Please refer to our prior cancer genetics clinic note for more information regarding our discussion, assessment and recommendations, at the time. Casey Lang recent genetic test results were disclosed to her, as were recommendations warranted by these results. These results and recommendations are discussed in more detail below.  CANCER HISTORY:  Oncology History  Ductal carcinoma in situ (DCIS) of left breast  11/02/2021 Initial Diagnosis   Screening mammogram detected left breast calcifications measuring 18.6 cm in span.  2 biopsies were performed both anterior and posterior revealed high-grade DCIS with solid cribriform and micropapillary features with necrosis ER 95%, PR 80% to 50%   11/14/2021 Cancer Staging   Staging form: Breast, AJCC 8th Edition - Clinical: Stage 0 (cTis (DCIS), cN0, cM0, G3, ER+, PR+, HER2: Not Assessed) - Signed by Nicholas Lose, MD on 11/14/2021 Histologic grading system: 3 grade system    Genetic Testing   [facility] CustomNext Panel+RNA was Negative. Report date is 11/22/2021.  The CustomNext-Cancer+RNAinsight panel offered by Althia Forts includes sequencing and rearrangement analysis for the following 47 genes:  APC, ATM, AXIN2, BARD1, BMPR1A, BRCA1, BRCA2, BRIP1, CDH1, CDK4, CDKN2A, CHEK2, CTNNA1, DICER1, EPCAM, GREM1, HOXB13, KIT, MEN1, MLH1, MSH2, MSH3, MSH6, MUTYH, NBN, NF1, NTHL1, PALB2, PDGFRA, PMS2, POLD1, POLE, PTEN, RAD50, RAD51C, RAD51D, SDHA, SDHB, SDHC, SDHD, SMAD4, SMARCA4, STK11, TP53, TSC1, TSC2, and VHL.  RNA data is routinely analyzed for use in variant interpretation for all genes.     FAMILY HISTORY:  We obtained a detailed, 4-generation family history.  Significant diagnoses are listed below: Family History  Problem Relation Age of Onset   Hypertension  Mother    Diabetes Mother    Hyperlipidemia Mother    Diabetes Father    Hypertension Father    Asthma Brother    Colon cancer Paternal Uncle 64 - 32   Hypertension Maternal Grandmother    Stroke Maternal Grandmother    Aneurysm Maternal Grandmother    Arthritis Maternal Grandmother    Heart attack Maternal Grandmother    Hypercholesterolemia Maternal Grandmother    Colon cancer Maternal Grandfather 55 - 69   Alzheimer's disease Paternal Grandmother    Breast cancer Paternal Grandmother 79 - 20   Anxiety disorder Other    Depression Other    Seizures Neg Hx       Casey Lang's maternal grandfather was diagnosed with CLL in his 9s, he is deceased. Her maternal great aunt (grandfather's sister) was diagnosed with colon cancer, she is deceased. Her maternal great uncle (grandfather's brother) was diagnosed with a brain tumor, he is deceased.    Casey Lang paternal uncle was diagnosed with colon cancer in his 14s. Her paternal grandmother was diagnosed with breast cancer in her 64s, she is deceased.    Casey Lang is unaware of previous family history of genetic testing for hereditary cancer risks. There is no reported Ashkenazi Jewish ancestry.   GENETIC TEST RESULTS:  The [facility] CustomNext Panel found no pathogenic mutations.   The CustomNext-Cancer+RNAinsight panel offered by Althia Forts includes sequencing and rearrangement analysis for the following 47 genes:  APC, ATM, AXIN2, BARD1, BMPR1A, BRCA1, BRCA2, BRIP1, CDH1, CDK4, CDKN2A, CHEK2, CTNNA1, DICER1, EPCAM, GREM1, HOXB13, KIT, MEN1, MLH1, MSH2, MSH3, MSH6, MUTYH, NBN, NF1, NTHL1, PALB2, PDGFRA, PMS2, POLD1, POLE, PTEN, RAD50, RAD51C, RAD51D, SDHA, SDHB, SDHC, SDHD, SMAD4,  SMARCA4, STK11, TP53, TSC1, TSC2, and VHL.  RNA data is routinely analyzed for use in variant interpretation for all genes.  The test report has been scanned into EPIC and is located under the Molecular Pathology section of the Results Review tab.  A  portion of the result report is included below for reference. Genetic testing reported out on 11/22/2021.        Even though a pathogenic variant was not identified, possible explanations for the cancer in the family may include: There may be no hereditary risk for cancer in the family. The cancers in Casey Lang and/or her family may be due to other genetic or environmental factors. There may be a gene mutation in one of these genes that current testing methods cannot detect, but that chance is small. There could be another gene that has not yet been discovered, or that we have not yet tested, that is responsible for the cancer diagnoses in the family.   Therefore, it is important to remain in touch with cancer genetics in the future so that we can continue to offer Casey Lang the most up to date genetic testing.   ADDITIONAL GENETIC TESTING:  We discussed with Casey Lang that her genetic testing was fairly extensive.  If there are genes identified to increase cancer risk that can be analyzed in the future, we would be happy to discuss and coordinate this testing at that time.     CANCER SCREENING RECOMMENDATIONS:  Casey Lang test result is considered negative (normal).  This means that we have not identified a hereditary cause for her personal and family history of cancer at this time.   An individual's cancer risk and medical management are not determined by genetic test results alone. Overall cancer risk assessment incorporates additional factors, including personal medical history, family history, and any available genetic information that may result in a personalized plan for cancer prevention and surveillance. Therefore, it is recommended she continue to follow the cancer management and screening guidelines provided by her oncology and primary healthcare provider.  RECOMMENDATIONS FOR FAMILY MEMBERS:   Since she did not inherit a mutation in a cancer predisposition gene  included on this panel, her son could not have inherited a mutation from her in one of these genes. Individuals in this family might be at some increased risk of developing cancer, over the general population risk, due to the family history of cancer. We recommend women in this family have a yearly mammogram beginning at age 100, or 42 years younger than the earliest onset of cancer, an annual clinical breast exam, and perform monthly breast self-exams.  FOLLOW-UP:  Cancer genetics is a rapidly advancing field and it is possible that new genetic tests will be appropriate for her and/or her family members in the future. We encouraged her to remain in contact with cancer genetics on an annual basis so we can update her personal and family histories and let her know of advances in cancer genetics that may benefit this family.   Our contact number was provided. Casey Lang questions were answered to her satisfaction, and she knows she is welcome to call us at anytime with additional questions or concerns.   Lucille Passy, MS, [facility] Genetic Counselor [email or web address] (P) [phone]

## 2021-11-29 ENCOUNTER — Other Ambulatory Visit: Payer: Self-pay

## 2021-11-29 ENCOUNTER — Encounter (HOSPITAL_BASED_OUTPATIENT_CLINIC_OR_DEPARTMENT_OTHER): Payer: Self-pay | Admitting: General Surgery

## 2021-11-29 ENCOUNTER — Ambulatory Visit (HOSPITAL_BASED_OUTPATIENT_CLINIC_OR_DEPARTMENT_OTHER): Payer: Medicaid Other | Admitting: Anesthesiology

## 2021-11-29 ENCOUNTER — Ambulatory Visit (HOSPITAL_BASED_OUTPATIENT_CLINIC_OR_DEPARTMENT_OTHER)
Admission: RE | Admit: 2021-11-29 | Discharge: 2021-11-30 | Disposition: A | Discharge status: Home / Self Care | Payer: Medicaid Other | Attending: General Surgery | Admitting: General Surgery

## 2021-11-29 ENCOUNTER — Encounter (HOSPITAL_BASED_OUTPATIENT_CLINIC_OR_DEPARTMENT_OTHER)
Admission: RE | Disposition: A | Discharge status: Home / Self Care | Payer: Self-pay | Source: Home / Self Care | Attending: General Surgery

## 2021-11-29 DIAGNOSIS — D649 Anemia, unspecified: Secondary | ICD-10-CM | POA: Diagnosis not present

## 2021-11-29 DIAGNOSIS — D0512 Intraductal carcinoma in situ of left breast: Secondary | ICD-10-CM | POA: Diagnosis present

## 2021-11-29 DIAGNOSIS — D630 Anemia in neoplastic disease: Secondary | ICD-10-CM | POA: Diagnosis not present

## 2021-11-29 DIAGNOSIS — D759 Disease of blood and blood-forming organs, unspecified: Secondary | ICD-10-CM | POA: Insufficient documentation

## 2021-11-29 DIAGNOSIS — F418 Other specified anxiety disorders: Secondary | ICD-10-CM

## 2021-11-29 DIAGNOSIS — Z803 Family history of malignant neoplasm of breast: Secondary | ICD-10-CM | POA: Diagnosis not present

## 2021-11-29 DIAGNOSIS — Z01818 Encounter for other preprocedural examination: Secondary | ICD-10-CM

## 2021-11-29 DIAGNOSIS — Z87891 Personal history of nicotine dependence: Secondary | ICD-10-CM | POA: Diagnosis not present

## 2021-11-29 DIAGNOSIS — F319 Bipolar disorder, unspecified: Secondary | ICD-10-CM | POA: Insufficient documentation

## 2021-11-29 HISTORY — PX: TOTAL MASTECTOMY: SHX6129

## 2021-11-29 HISTORY — PX: MASTECTOMY W/ SENTINEL NODE BIOPSY: SHX2001

## 2021-11-29 LAB — POCT PREGNANCY, URINE: Preg Test, Ur: NEGATIVE

## 2021-11-29 SURGERY — MASTECTOMY WITH SENTINEL LYMPH NODE BIOPSY
Anesthesia: General | Site: Breast | Laterality: Right

## 2021-11-29 MED ORDER — EPHEDRINE 5 MG/ML INJ
INTRAVENOUS | Status: AC
  Filled 2021-11-29: qty 5

## 2021-11-29 MED ORDER — ACETAMINOPHEN 500 MG PO TABS
1000.0000 mg | ORAL_TABLET | Freq: Once | ORAL | Status: AC
  Administered 2021-11-29: 1000 mg via ORAL

## 2021-11-29 MED ORDER — KETAMINE HCL 50 MG/5ML IJ SOSY
PREFILLED_SYRINGE | INTRAMUSCULAR | Status: AC
  Filled 2021-11-29: qty 5

## 2021-11-29 MED ORDER — FENTANYL CITRATE (PF) 100 MCG/2ML IJ SOLN
INTRAMUSCULAR | Status: AC
  Filled 2021-11-29: qty 2

## 2021-11-29 MED ORDER — ATROPINE SULFATE 0.4 MG/ML IV SOLN
INTRAVENOUS | Status: AC
  Filled 2021-11-29: qty 1

## 2021-11-29 MED ORDER — IBUPROFEN 200 MG PO TABS
ORAL_TABLET | ORAL | Status: AC
  Filled 2021-11-29: qty 3

## 2021-11-29 MED ORDER — ENOXAPARIN SODIUM 30 MG/0.3ML IJ SOSY
30.0000 mg | PREFILLED_SYRINGE | INTRAMUSCULAR | Status: DC
  Filled 2021-11-29: qty 0.3

## 2021-11-29 MED ORDER — HYDROMORPHONE HCL 1 MG/ML IJ SOLN
INTRAMUSCULAR | Status: AC
  Filled 2021-11-29: qty 0.5

## 2021-11-29 MED ORDER — GABAPENTIN 300 MG PO CAPS
ORAL_CAPSULE | ORAL | Status: AC
  Filled 2021-11-29: qty 1

## 2021-11-29 MED ORDER — HYDROMORPHONE HCL 1 MG/ML IJ SOLN
INTRAMUSCULAR | Status: AC
  Filled 2021-11-29: qty 1

## 2021-11-29 MED ORDER — ONDANSETRON HCL 4 MG/2ML IJ SOLN
INTRAMUSCULAR | Status: AC
  Filled 2021-11-29: qty 2

## 2021-11-29 MED ORDER — SCOPOLAMINE 1 MG/3DAYS TD PT72
MEDICATED_PATCH | TRANSDERMAL | Status: AC
  Filled 2021-11-29: qty 1

## 2021-11-29 MED ORDER — DEXAMETHASONE SODIUM PHOSPHATE 10 MG/ML IJ SOLN
INTRAMUSCULAR | Status: AC
  Filled 2021-11-29: qty 1

## 2021-11-29 MED ORDER — CEFAZOLIN SODIUM-DEXTROSE 2-4 GM/100ML-% IV SOLN
2.0000 g | INTRAVENOUS | Status: AC
  Administered 2021-11-29: 2 g via INTRAVENOUS

## 2021-11-29 MED ORDER — METHOCARBAMOL 500 MG PO TABS
500.0000 mg | ORAL_TABLET | Freq: Four times a day (QID) | ORAL | Status: DC | PRN
  Administered 2021-11-29 – 2021-11-30 (×3): 500 mg via ORAL
  Filled 2021-11-29 (×3): qty 1

## 2021-11-29 MED ORDER — FENTANYL CITRATE (PF) 100 MCG/2ML IJ SOLN
12.5000 ug | INTRAMUSCULAR | Status: DC | PRN
  Administered 2021-11-29: 25 ug via INTRAVENOUS

## 2021-11-29 MED ORDER — ROCURONIUM BROMIDE 100 MG/10ML IV SOLN
INTRAVENOUS | Status: DC | PRN
  Administered 2021-11-29: 70 mg via INTRAVENOUS
  Administered 2021-11-29 (×2): 10 mg via INTRAVENOUS

## 2021-11-29 MED ORDER — LACTATED RINGERS IV SOLN: INTRAVENOUS | Status: DC

## 2021-11-29 MED ORDER — ACETAMINOPHEN 500 MG PO TABS
1000.0000 mg | ORAL_TABLET | Freq: Four times a day (QID) | ORAL | Status: DC | PRN
  Administered 2021-11-29 – 2021-11-30 (×2): 1000 mg via ORAL
  Filled 2021-11-29 (×2): qty 2

## 2021-11-29 MED ORDER — ONDANSETRON HCL 4 MG/2ML IJ SOLN
4.0000 mg | Freq: Four times a day (QID) | INTRAMUSCULAR | Status: DC | PRN
  Administered 2021-11-29: 4 mg via INTRAVENOUS
  Filled 2021-11-29: qty 2

## 2021-11-29 MED ORDER — MIDAZOLAM HCL 2 MG/2ML IJ SOLN
2.0000 mg | Freq: Once | INTRAMUSCULAR | Status: AC
  Administered 2021-11-29: 2 mg via INTRAVENOUS

## 2021-11-29 MED ORDER — TRAMADOL HCL 50 MG PO TABS
50.0000 mg | ORAL_TABLET | Freq: Four times a day (QID) | ORAL | Status: DC | PRN
  Administered 2021-11-29 – 2021-11-30 (×2): 50 mg via ORAL
  Filled 2021-11-29 (×2): qty 1

## 2021-11-29 MED ORDER — FENTANYL CITRATE (PF) 100 MCG/2ML IJ SOLN
100.0000 ug | Freq: Once | INTRAMUSCULAR | Status: AC
  Administered 2021-11-29: 100 ug via INTRAVENOUS

## 2021-11-29 MED ORDER — HYDROMORPHONE HCL 1 MG/ML IJ SOLN
0.2500 mg | INTRAMUSCULAR | Status: DC | PRN
  Administered 2021-11-29 (×2): 0.5 mg via INTRAVENOUS

## 2021-11-29 MED ORDER — HYDROMORPHONE HCL 1 MG/ML IJ SOLN
INTRAMUSCULAR | Status: DC | PRN
  Administered 2021-11-29: .5 mg via INTRAVENOUS

## 2021-11-29 MED ORDER — LIDOCAINE 2% (20 MG/ML) 5 ML SYRINGE
INTRAMUSCULAR | Status: AC
  Filled 2021-11-29: qty 5

## 2021-11-29 MED ORDER — CHLORHEXIDINE GLUCONATE CLOTH 2 % EX PADS: 6.0000 | MEDICATED_PAD | Freq: Once | CUTANEOUS | Status: DC

## 2021-11-29 MED ORDER — PROPOFOL 10 MG/ML IV BOLUS
INTRAVENOUS | Status: DC | PRN
  Administered 2021-11-29: 200 mg via INTRAVENOUS

## 2021-11-29 MED ORDER — CELECOXIB 200 MG PO CAPS
200.0000 mg | ORAL_CAPSULE | ORAL | Status: AC
  Administered 2021-11-29: 200 mg via ORAL

## 2021-11-29 MED ORDER — ACETAMINOPHEN 500 MG PO TABS
ORAL_TABLET | ORAL | Status: AC
  Filled 2021-11-29: qty 2

## 2021-11-29 MED ORDER — SCOPOLAMINE 1 MG/3DAYS TD PT72
1.0000 | MEDICATED_PATCH | Freq: Once | TRANSDERMAL | Status: DC
  Administered 2021-11-29: 1.5 mg via TRANSDERMAL

## 2021-11-29 MED ORDER — BUPIVACAINE-EPINEPHRINE (PF) 0.25% -1:200000 IJ SOLN
INTRAMUSCULAR | Status: DC | PRN
  Administered 2021-11-29 (×2): 20 mL via PERINEURAL

## 2021-11-29 MED ORDER — QUETIAPINE FUMARATE 25 MG PO TABS
25.0000 mg | ORAL_TABLET | Freq: Three times a day (TID) | ORAL | Status: DC
  Filled 2021-11-29 (×4): qty 1

## 2021-11-29 MED ORDER — SUGAMMADEX SODIUM 200 MG/2ML IV SOLN
INTRAVENOUS | Status: DC | PRN
  Administered 2021-11-29: 200 mg via INTRAVENOUS

## 2021-11-29 MED ORDER — IBUPROFEN 600 MG PO TABS: 600.0000 mg | ORAL_TABLET | Freq: Four times a day (QID) | ORAL | Status: DC | PRN

## 2021-11-29 MED ORDER — GABAPENTIN 300 MG PO CAPS
300.0000 mg | ORAL_CAPSULE | ORAL | Status: AC
  Administered 2021-11-29: 300 mg via ORAL

## 2021-11-29 MED ORDER — LIDOCAINE HCL (CARDIAC) PF 100 MG/5ML IV SOSY
PREFILLED_SYRINGE | INTRAVENOUS | Status: DC | PRN
  Administered 2021-11-29: 100 mg via INTRAVENOUS

## 2021-11-29 MED ORDER — GABAPENTIN 100 MG PO CAPS
100.0000 mg | ORAL_CAPSULE | Freq: Three times a day (TID) | ORAL | Status: DC
  Administered 2021-11-29: 100 mg via ORAL
  Filled 2021-11-29 (×3): qty 1

## 2021-11-29 MED ORDER — MIDAZOLAM HCL 2 MG/2ML IJ SOLN
INTRAMUSCULAR | Status: AC
  Filled 2021-11-29: qty 2

## 2021-11-29 MED ORDER — SODIUM CHLORIDE 0.9 % IV SOLN: INTRAVENOUS | Status: DC

## 2021-11-29 MED ORDER — MAGTRACE LYMPHATIC TRACER
INTRAMUSCULAR | Status: DC | PRN
  Administered 2021-11-29: 2 mL via INTRAMUSCULAR

## 2021-11-29 MED ORDER — CELECOXIB 200 MG PO CAPS
ORAL_CAPSULE | ORAL | Status: AC
  Filled 2021-11-29: qty 1

## 2021-11-29 MED ORDER — ACETAMINOPHEN 500 MG PO TABS: 1000.0000 mg | ORAL_TABLET | ORAL | Status: DC

## 2021-11-29 MED ORDER — DEXAMETHASONE SODIUM PHOSPHATE 4 MG/ML IJ SOLN
INTRAMUSCULAR | Status: DC | PRN
  Administered 2021-11-29: 5 mg via INTRAVENOUS

## 2021-11-29 MED ORDER — PANTOPRAZOLE SODIUM 40 MG IV SOLR
40.0000 mg | Freq: Every day | INTRAVENOUS | Status: DC
  Administered 2021-11-29: 40 mg via INTRAVENOUS
  Filled 2021-11-29: qty 10

## 2021-11-29 MED ORDER — FENTANYL CITRATE (PF) 100 MCG/2ML IJ SOLN
INTRAMUSCULAR | Status: DC | PRN
  Administered 2021-11-29 (×2): 50 ug via INTRAVENOUS

## 2021-11-29 MED ORDER — BUPIVACAINE LIPOSOME 1.3 % IJ SUSP
INTRAMUSCULAR | Status: DC | PRN
  Administered 2021-11-29 (×2): 10 mL via PERINEURAL

## 2021-11-29 MED ORDER — CEFAZOLIN SODIUM-DEXTROSE 2-4 GM/100ML-% IV SOLN
INTRAVENOUS | Status: AC
  Filled 2021-11-29: qty 100

## 2021-11-29 MED ORDER — PHENYLEPHRINE 80 MCG/ML (10ML) SYRINGE FOR IV PUSH (FOR BLOOD PRESSURE SUPPORT)
PREFILLED_SYRINGE | INTRAVENOUS | Status: AC
  Filled 2021-11-29: qty 10

## 2021-11-29 MED ORDER — CLONAZEPAM 0.5 MG PO TABS
0.5000 mg | ORAL_TABLET | Freq: Two times a day (BID) | ORAL | Status: DC | PRN
  Administered 2021-11-29: 0.5 mg via ORAL
  Filled 2021-11-29: qty 1

## 2021-11-29 MED ORDER — KETAMINE HCL 10 MG/ML IJ SOLN
INTRAMUSCULAR | Status: DC | PRN
  Administered 2021-11-29 (×4): 10 mg via INTRAVENOUS

## 2021-11-29 MED ORDER — HYDROMORPHONE HCL 1 MG/ML IJ SOLN
0.5000 mg | INTRAMUSCULAR | Status: DC | PRN
  Administered 2021-11-29: 0.5 mg via INTRAVENOUS

## 2021-11-29 MED ORDER — SUCCINYLCHOLINE CHLORIDE 200 MG/10ML IV SOSY
PREFILLED_SYRINGE | INTRAVENOUS | Status: AC
  Filled 2021-11-29: qty 10

## 2021-11-29 MED ORDER — ONDANSETRON 4 MG PO TBDP: 4.0000 mg | ORAL_TABLET | Freq: Four times a day (QID) | ORAL | Status: DC | PRN

## 2021-11-29 MED ORDER — AMISULPRIDE (ANTIEMETIC) 5 MG/2ML IV SOLN: 10.0000 mg | Freq: Once | INTRAVENOUS | Status: DC | PRN

## 2021-11-29 SURGICAL SUPPLY — 55 items
ADH SKN CLS APL DERMABOND .7 (GAUZE/BANDAGES/DRESSINGS) ×2
APL PRP STRL LF DISP 70% ISPRP (MISCELLANEOUS) ×4
APPLIER CLIP 9.375 MED OPEN (MISCELLANEOUS) ×10
APR CLP MED 9.3 20 MLT OPN (MISCELLANEOUS) ×10
BINDER BREAST LRG (GAUZE/BANDAGES/DRESSINGS) IMPLANT
BINDER BREAST XLRG (GAUZE/BANDAGES/DRESSINGS) IMPLANT
BINDER BREAST XXLRG (GAUZE/BANDAGES/DRESSINGS) IMPLANT
BIOPATCH RED 1 DISK 7.0 (GAUZE/BANDAGES/DRESSINGS) ×2 IMPLANT
BLADE SURG 10 STRL SS (BLADE) ×2 IMPLANT
BLADE SURG 15 STRL LF DISP TIS (BLADE) ×2 IMPLANT
BLADE SURG 15 STRL SS (BLADE) ×2
CANISTER SUCT 1200ML W/VALVE (MISCELLANEOUS) ×2 IMPLANT
CHLORAPREP W/TINT 26 (MISCELLANEOUS) ×2 IMPLANT
CLIP APPLIE 9.375 MED OPEN (MISCELLANEOUS) ×2 IMPLANT
COVER BACK TABLE 60X90IN (DRAPES) ×2 IMPLANT
COVER MAYO STAND STRL (DRAPES) ×2 IMPLANT
COVER PROBE W GEL 5X96 (DRAPES) ×2 IMPLANT
DERMABOND ADVANCED .7 DNX12 (GAUZE/BANDAGES/DRESSINGS) ×2 IMPLANT
DEVICE DSSCT PLSMBLD 3.0S LGHT (MISCELLANEOUS) ×2 IMPLANT
DRAIN CHANNEL 19F RND (DRAIN) ×2 IMPLANT
DRAPE LAPAROSCOPIC ABDOMINAL (DRAPES) ×2 IMPLANT
DRAPE UTILITY XL STRL (DRAPES) ×2 IMPLANT
DRSG TEGADERM 2-3/8X2-3/4 SM (GAUZE/BANDAGES/DRESSINGS) ×2 IMPLANT
ELECT COATED BLADE 2.86 ST (ELECTRODE) IMPLANT
ELECT REM PT RETURN 9FT ADLT (ELECTROSURGICAL) ×2
ELECTRODE REM PT RTRN 9FT ADLT (ELECTROSURGICAL) ×2 IMPLANT
EVACUATOR SILICONE 100CC (DRAIN) ×2 IMPLANT
GAUZE PAD ABD 8X10 STRL (GAUZE/BANDAGES/DRESSINGS) ×2 IMPLANT
GAUZE SPONGE 4X4 12PLY STRL LF (GAUZE/BANDAGES/DRESSINGS) ×2 IMPLANT
GLOVE BIO SURGEON STRL SZ7.5 (GLOVE) ×2 IMPLANT
GLOVE BIOGEL PI IND STRL 7.5 (GLOVE) IMPLANT
GOWN STRL REUS W/ TWL LRG LVL3 (GOWN DISPOSABLE) ×4 IMPLANT
GOWN STRL REUS W/TWL LRG LVL3 (GOWN DISPOSABLE) ×6
ILLUMINATOR WAVEGUIDE N/F (MISCELLANEOUS) IMPLANT
LIGHT WAVEGUIDE WIDE FLAT (MISCELLANEOUS) IMPLANT
NDL HYPO 25X1 1.5 SAFETY (NEEDLE) ×2 IMPLANT
NEEDLE HYPO 25X1 1.5 SAFETY (NEEDLE) ×4 IMPLANT
NS IRRIG 1000ML POUR BTL (IV SOLUTION) ×2 IMPLANT
PACK BASIN DAY SURGERY FS (CUSTOM PROCEDURE TRAY) ×2 IMPLANT
PENCIL SMOKE EVACUATOR (MISCELLANEOUS) IMPLANT
PIN SAFETY STERILE (MISCELLANEOUS) ×2 IMPLANT
PLASMABLADE 3.0S W/LIGHT (MISCELLANEOUS) ×2
SLEEVE SCD COMPRESS KNEE MED (STOCKING) ×2 IMPLANT
SPIKE FLUID TRANSFER (MISCELLANEOUS) IMPLANT
SPONGE T-LAP 18X18 ~~LOC~~+RFID (SPONGE) ×2 IMPLANT
SUT ETHILON 2 0 FS 18 (SUTURE) ×2 IMPLANT
SUT MNCRL AB 4-0 PS2 18 (SUTURE) ×2 IMPLANT
SUT SILK 2 0 SH (SUTURE) IMPLANT
SUT VICRYL 3-0 CR8 SH (SUTURE) ×2 IMPLANT
SYR CONTROL 10ML LL (SYRINGE) ×2 IMPLANT
TOWEL GREEN STERILE FF (TOWEL DISPOSABLE) ×2 IMPLANT
TRACER MAGTRACE VIAL (MISCELLANEOUS) IMPLANT
TRAY FOLEY W/BAG SLVR 14FR LF (SET/KITS/TRAYS/PACK) IMPLANT
TUBE CONNECTING 20X1/4 (TUBING) ×2 IMPLANT
YANKAUER SUCT BULB TIP NO VENT (SUCTIONS) ×2 IMPLANT

## 2021-11-29 NOTE — Anesthesia Procedure Notes (Signed)
Anesthesia Regional Block: Pectoralis block   Pre-Anesthetic Checklist: , timeout performed,  Correct Patient, Correct Site, Correct Laterality,  Correct Procedure, Correct Position, site marked,  Risks and benefits discussed,  Pre-op evaluation,  At surgeon's request and post-op pain management  Laterality: Right  Prep: Maximum Sterile Barrier Precautions used, chloraprep       Needles:  Injection technique: Single-shot  Needle Type: Echogenic Stimulator Needle     Needle Length: 9cm  Needle Gauge: 22     Additional Needles:   Procedures:,,,, ultrasound used (permanent image in chart),,    Narrative:  Start time: 11/29/2021 8:05 AM End time: 11/29/2021 8:07 AM Injection made incrementally with aspirations every 5 mL.  Performed by: Personally  Anesthesiologist: Brennan Bailey, MD  Additional Notes: Risks, benefits, and alternative discussed. Patient gave consent for procedure. Patient prepped and draped in sterile fashion. Sedation administered, patient remains easily responsive to voice. Relevant anatomy identified with ultrasound guidance. Local anesthetic given in 5cc increments with no signs or symptoms of intravascular injection. No pain or paraesthesias with injection. Patient monitored throughout procedure with signs of LAST or immediate complications. Tolerated well. Ultrasound image placed in chart.  Tawny Asal, MD

## 2021-11-29 NOTE — Op Note (Signed)
11/29/2021  12:14 PM  PATIENT:  Casey Lang  41 y.o. female  PRE-OPERATIVE DIAGNOSIS:  LEFT BREAST DCIS  POST-OPERATIVE DIAGNOSIS:  LEFT BREAST DCIS  PROCEDURE:  Procedure(s): LEFT MASTECTOMY WITH SENTINEL LYMPH NODE BIOPSY (Left) RIGHT TOTAL MASTECTOMY (Right)  SURGEON:  Surgeon(s) and Role:    * Jovita Kussmaul, MD - Primary  PHYSICIAN ASSISTANT:   ASSISTANTS: none   ANESTHESIA:   general  EBL:  70 mL   BLOOD ADMINISTERED:none  DRAINS: (2) Blake drain(s) in the prepectoral space    LOCAL MEDICATIONS USED:  NONE  SPECIMEN:  Source of Specimen:  left mastectomy and sentinel nodes x 2, right mastectomy  DISPOSITION OF SPECIMEN:  PATHOLOGY  COUNTS:  YES  TOURNIQUET:  * No tourniquets in log *  DICTATION: .Dragon Dictation  After informed consent was obtained the patient was brought to the operating room and placed in the supine position on the operating table.  After adequate induction of general anesthesia the patient's bilateral chest, breast, and axillary areas were prepped with ChloraPrep, allowed to dry, and draped in usual sterile manner.  An appropriate timeout was performed.  At this point, 2 cc of iron oxide were injected into the subareolar plexus on the left breast.  Attention was first turned to the right breast.  An elliptical incision was made around the nipple and areolar complex with a 10 blade knife in order to minimize the excess skin.  The incision was carried through the skin and subcutaneous tissue sharply with the PlasmaBlade.  Breast hooks were used to elevate the skin flaps anteriorly towards the ceiling.  Thin skin flaps were then created by dissecting between the breast tissue and the subcutaneous fat and skin.  This dissection was carried circumferentially all the way to the chest wall.  Next the breast was removed from the pectoralis muscle with the pectoralis fascia.  Once this was accomplished then the entire right breast was removed from the  patient.  It was marked with a stitch on the lateral skin and sent to pathology for further evaluation.  Hemostasis was achieved using the PlasmaBlade.  A small stab incision was made near the anterior axillary line inferior to the operative bed.  A tonsil clamp was placed through this opening and used to bring a 19 [place] round Blake drain into the operative bed.  The drain was anchored to the skin with a 3-0 nylon stitch.  The drain was curled along the chest wall.  Next the superior and inferior skin flaps were grossly reapproximated with interrupted 3-0 Vicryl stitches.  The skin was then closed with a running 4-0 Monocryl subcuticular stitch.  The drain was placed to bulb suction and there was a good seal.  Attention was then turned to the left breast.  A similar elliptical incision was made with a 10 blade knife around the nipple and areola complex in order to minimize the excess skin.  The incision was carried through the skin and subcutaneous tissue sharply with the PlasmaBlade.  Breast hooks were used to elevate the skin flaps anteriorly towards the ceiling.  Thin skin flaps were then created by dissecting between the breast tissue and the subcutaneous fat and skin.  This dissection was carried circumferentially all the way to the chest wall.  Next the breast was removed from the pectoralis muscle with the pectoralis fascia.  Medially on both sides several perforating vessels were controlled with figure-of-eight 3-0 Vicryl stitches.  Once this dissection was accomplished then the  entire left breast was removed from the patient.  It was marked with a stitch on the lateral skin and sent to pathology for further evaluation.  The Sentimag was then used to examine the left axilla.  I was able to identify 2 lymph nodes with signal.  The second lymph node with signal had an adjacent lymph node that had what look like tracer in it but no signal.  These nodes were sent as sentinel nodes numbers 1 and 2 and the  second node likely had 2 lymph nodes within the specimen to pathology for further evaluation.  Hemostasis was achieved using the PlasmaBlade.  Next a small stab incision was made near the anterior axillary line inferior to the operative bed.  A tonsil clamp was placed through this opening and used to bring a 19 [place] round Blake drain into the operative bed.  The drain was curled along the chest wall.  The drain was anchored to the skin with a 3-0 nylon stitch.  Next the superior and inferior skin flaps were grossly reapproximated with interrupted 3-0 Vicryl stitches.  The skin was closed with a running 4-0 Monocryl subcuticular stitch.  Dermabond dressings and sterile drain dressings were applied.  The drains were placed to bulb suction and there was a good seal.  The patient tolerated the procedure well.  At the end of the case all needle sponge and instrument counts were correct.  The patient was then awakened and taken to recovery in stable condition.  PLAN OF CARE: Admit for overnight observation  PATIENT DISPOSITION:  PACU - hemodynamically stable.   Delay start of Pharmacological VTE agent (>24hrs) due to surgical blood loss or risk of bleeding: no

## 2021-11-29 NOTE — Transfer of Care (Signed)
Immediate Anesthesia Transfer of Care Note  Patient: Casey Lang  Procedure(s) Performed: LEFT MASTECTOMY WITH SENTINEL LYMPH NODE BIOPSY (Left: Breast) RIGHT TOTAL MASTECTOMY (Right: Breast)  Patient Location: PACU  Anesthesia Type:GA combined with regional for post-op pain  Level of Consciousness: awake  Airway & Oxygen Therapy: Patient Spontanous Breathing and Patient connected to face mask oxygen  Post-op Assessment: Report given to RN and Post -op Vital signs reviewed and stable  Post vital signs: Reviewed and stable  Last Vitals:  Vitals Value Taken Time  BP 131/97 11/29/21 1230  Temp    Pulse    Resp    SpO2    Vitals shown include unvalidated device data.  Last Pain:  Vitals:   11/29/21 0700  TempSrc: Oral  PainSc: 0-No pain      Patients Stated Pain Goal: 8 (68/61/68 3729)  Complications: No notable events documented.

## 2021-11-29 NOTE — Anesthesia Procedure Notes (Signed)
Anesthesia Regional Block: Pectoralis block   Pre-Anesthetic Checklist: , timeout performed,  Correct Patient, Correct Site, Correct Laterality,  Correct Procedure, Correct Position, site marked,  Risks and benefits discussed,  Pre-op evaluation,  At surgeon's request and post-op pain management  Laterality: Left  Prep: Maximum Sterile Barrier Precautions used, chloraprep       Needles:  Injection technique: Single-shot  Needle Type: Echogenic Stimulator Needle     Needle Length: 9cm  Needle Gauge: 22     Additional Needles:   Procedures:,,,, ultrasound used (permanent image in chart),,    Narrative:  Start time: 11/29/2021 8:02 AM End time: 11/29/2021 8:05 AM Injection made incrementally with aspirations every 5 mL.  Performed by: Personally  Anesthesiologist: Brennan Bailey, MD  Additional Notes: Risks, benefits, and alternative discussed. Patient gave consent for procedure. Patient prepped and draped in sterile fashion. Sedation administered, patient remains easily responsive to voice. Relevant anatomy identified with ultrasound guidance. Local anesthetic given in 5cc increments with no signs or symptoms of intravascular injection. No pain or paraesthesias with injection. Patient monitored throughout procedure with signs of LAST or immediate complications. Tolerated well. Ultrasound image placed in chart.  Tawny Asal, MD

## 2021-11-29 NOTE — H&P (Signed)
REFERRING PHYSICIAN: Rulon Eisenmenger, MD  PROVIDER: Landry Corporal, MD  MRN: [id number] DOB: Mar 02, 1980 Subjective   Chief Complaint: Breast Cancer   History of Present Illness: Casey Lang is a 41 y.o. female who is seen today as an office consultation for evaluation of Breast Cancer .   We are asked to see the patient in consultation by Dr. Lindi Adie to evaluate her for a new left breast cancer. The patient is a 41 year old black female who recently went for a routine screening mammogram. At that time she was found to have abnormal calcifications in the upper outer quadrant of the left breast. The extent of the calcifications measured 18 cm. 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 She does have a family history of breast cancer in a paternal grandmother. She does not smoke.  Review of Systems: A complete review of systems was obtained from the patient. I have reviewed this information and discussed as appropriate with the patient. See HPI as well for other ROS.  ROS   Medical History: Past Medical History:  Diagnosis Date  Anemia  Anxiety   Patient Active Problem List  Diagnosis  Ductal carcinoma in situ (DCIS) of left breast   Past Surgical History:  Procedure Laterality Date  Bunionectomy Bilateral  2013  Gastric Sleeve  2014  Left Ankle Left  "screws were put in" 2005    Allergies  Allergen Reactions  Morphine Other (See Comments)  Other Reaction: Other reaction   Current Outpatient Medications on File Prior to Visit  Medication Sig Dispense Refill  biotin 5,000 mcg Chew Take 1 tablet by mouth once daily  calcium carbonate-vitamin D3 (OS-CAL 500+D) 500 mg-5 mcg (200 unit) tablet Take 1 tablet by mouth once daily  clonazePAM (KLONOPIN) 0.5 MG tablet Take 0.5 mg by mouth at bedtime  ferrous sulfate 325 (65 FE) MG tablet Take 325 mg by mouth daily with breakfast   gabapentin (NEURONTIN) 100 MG capsule Take 200 mg by mouth at bedtime  multivitamin tablet Take 1 tablet by mouth once daily  omega 3-dha-epa-fish oil 1,200 (144-216) mg Cap Take 1 capsule by mouth once daily  QUEtiapine (SEROQUEL) 25 MG tablet Take 3 tablets by mouth at bedtime  Saccharomyces boulardii (FLORASTOR) 250 mg capsule Take 250 mg by mouth once daily Probiotic  turmeric 400 mg Cap Take 1 capsule by mouth once daily 500 mg   No current facility-administered medications on file prior to visit.   Family History  Problem Relation Age of Onset  Stroke Mother  Obesity Mother  High blood pressure (Hypertension) Mother  Hyperlipidemia (Elevated cholesterol) Mother  Diabetes Mother  Obesity Father  High blood pressure (Hypertension) Father  Hyperlipidemia (Elevated cholesterol) Father  Diabetes Father  Colon cancer Maternal Aunt  Colon cancer Paternal Uncle  Stroke Maternal Grandmother  Coronary Artery Disease (Blocked arteries around heart) Maternal Grandmother  Hyperlipidemia (Elevated cholesterol) Paternal Grandmother  Breast cancer Paternal Grandmother    Social History   Tobacco Use  Smoking Status Former  Types: Cigarettes  Smokeless Tobacco Never  Tobacco Comments  Smoke socially while in college, brief period of time    Social History   Socioeconomic History  Marital status: Unknown  Tobacco Use  Smoking status: Former  Types: Cigarettes  Smokeless tobacco: Never  Tobacco comments:  Smoke socially while in college, brief period of time  Vaping Use  Vaping Use: Never used  Substance and Sexual Activity  Alcohol use: Not Currently  Drug use: Not Currently  Comment: Marijuana in college   Objective:  There were no vitals filed for this visit.  There is no height or weight on file to calculate BMI.  Physical Exam Vitals reviewed.  Constitutional:  General: She is not in acute distress. Appearance: Normal appearance.  HENT:  Head: Normocephalic  and atraumatic.  Right Ear: External ear normal.  Left Ear: External ear normal.  Nose: Nose normal.  Mouth/Throat:  Mouth: Mucous membranes are moist.  Pharynx: Oropharynx is clear.  Eyes:  General: No scleral icterus. Extraocular Movements: Extraocular movements intact.  Conjunctiva/sclera: Conjunctivae normal.  Pupils: Pupils are equal, round, and reactive to light.  Cardiovascular:  Rate and Rhythm: Normal rate and regular rhythm.  Pulses: Normal pulses.  Heart sounds: Normal heart sounds.  Pulmonary:  Effort: Pulmonary effort is normal. No respiratory distress.  Breath sounds: Normal breath sounds.  Abdominal:  General: Bowel sounds are normal.  Palpations: Abdomen is soft.  Tenderness: There is no abdominal tenderness.  Musculoskeletal:  General: No swelling, tenderness or deformity. Normal range of motion.  Cervical back: Normal range of motion and neck supple.  Skin: General: Skin is warm and dry.  Coloration: Skin is not jaundiced.  Neurological:  General: No focal deficit present.  Mental Status: She is alert and oriented to person, place, and time.  Psychiatric:  Mood and Affect: Mood normal.  Behavior: Behavior normal.    Breast: There is a palpable bruise in the upper outer quadrant of the left breast. Other than this there is no palpable mass in either breast. There is no palpable axillary, supraclavicular, or cervical lymphadenopathy.  Labs, Imaging and Diagnostic Testing:  Assessment and Plan:   Diagnoses and all orders for this visit:  Ductal carcinoma in situ (DCIS) of left breast - Ambulatory Referral to Oncology-Medical - Ambulatory Referral to Radiation Oncology - Ambulatory Referral to Physical Therapy - [facility] Case Posting Request; Future    The patient appears to have a very large area of ductal carcinoma in situ in the upper outer quadrant of the left breast. Because of the size of the area involved my recommendation would be for mastectomy.  She would be a good candidate for sentinel node biopsy as well. I have discussed with her in detail the risks and benefits of the operation as well as some of the technical aspects and she understands and wishes to proceed. She would prefer to have bilateral mastectomies performed without reconstruction. She also asked specifically for Dr. Donne Hazel. We will try to make that appointment for her. If she cannot make that appointment then we will be happy to take care of her. She will also meet with medical and radiation oncology to discuss adjuvant therapy.

## 2021-11-29 NOTE — Progress Notes (Signed)
Patient called out with 6/10 pain feeling like it was very tight and sharp. Assessed chest and incisions. Chest looks within limits of arriving to [facility]. Two JP drains are draining 20cc on right and 30 on left over the last three hours. Blood pressure 120/84, hr 65.  MD notified at 1845 of situation and called back at 1850. New orders were placed and patient is now settled and feels much better with a pain score of 2/10. Orders were placed for tylenol and dilaudid. Will continue to monitor patient. Setzer, Marchelle Folks

## 2021-11-29 NOTE — Discharge Instructions (Signed)
Post Anesthesia Home Care Instructions  Activity: Get plenty of rest for the remainder of the day. A responsible individual must stay with you for 24 hours following the procedure.  For the next 24 hours, DO NOT: -Drive a car -Paediatric nurse -Drink alcoholic beverages -Take any medication unless instructed by your physician -Make any legal decisions or sign important papers.  Meals: Start with liquid foods such as gelatin or soup. Progress to regular foods as tolerated. Avoid greasy, spicy, heavy foods. If nausea and/or vomiting occur, drink only clear liquids until the nausea and/or vomiting subsides. Call your physician if vomiting continues.  Special Instructions/Symptoms: Your throat may feel dry or sore from the anesthesia or the breathing tube placed in your throat during surgery. If this causes discomfort, gargle with warm salt water. The discomfort should disappear within 24 hours.  If you had a scopolamine patch placed behind your ear for the management of post- operative nausea and/or vomiting:  1. The medication in the patch is effective for 72 hours, after which it should be removed.  Wrap patch in a tissue and discard in the trash. Wash hands thoroughly with soap and water. 2. You may remove the patch earlier than 72 hours if you experience unpleasant side effects which may include dry mouth, dizziness or visual disturbances. 3. Avoid touching the patch. Wash your hands with soap and water after contact with the patch.      Information for Discharge Teaching: EXPAREL (bupivacaine liposome injectable suspension)   Your surgeon or anesthesiologist gave you EXPAREL(bupivacaine) to help control your pain after surgery.  EXPAREL is a local anesthetic that provides pain relief by numbing the tissue around the surgical site. EXPAREL is designed to release pain medication over time and can control pain for up to 72 hours. Depending on how you respond to EXPAREL, you may  require less pain medication during your recovery.  Possible side effects: Temporary loss of sensation or ability to move in the area where bupivacaine was injected. Nausea, vomiting, constipation Rarely, numbness and tingling in your mouth or lips, lightheadedness, or anxiety may occur. Call your doctor right away if you think you may be experiencing any of these sensations, or if you have other questions regarding possible side effects.  Follow all other discharge instructions given to you by your surgeon or nurse. Eat a healthy diet and drink plenty of water or other fluids.  If you return to the hospital for any reason within 96 hours following the administration of EXPAREL, it is important for health care providers to know that you have received this anesthetic. A teal colored band has been placed on your arm with the date, time and amount of EXPAREL you have received in order to alert and inform your health care providers. Please leave this armband in place for the full 96 hours following administration, and then you may remove the band.   JP Drain [employer] this sheet to all of your post-operative appointments while you have your drains. Please measure your drains by CC's or ML's. Make sure you drain and measure your JP Drains 2 or 3 times per day. At the end of each day, add up totals for the left side and add up totals for the right side.    ( 9 am )     ( 3 pm )        ( 9 pm )  Date L  R  L  R  L  R  Total L/R

## 2021-11-29 NOTE — Anesthesia Postprocedure Evaluation (Signed)
Anesthesia Post Note  Patient: Casey Lang  Procedure(s) Performed: LEFT MASTECTOMY WITH SENTINEL LYMPH NODE BIOPSY (Left: Breast) RIGHT TOTAL MASTECTOMY (Right: Breast)     Patient location during evaluation: PACU Anesthesia Type: General Level of consciousness: sedated Pain management: pain level controlled Vital Signs Assessment: post-procedure vital signs reviewed and stable Respiratory status: spontaneous breathing and respiratory function stable Cardiovascular status: stable Postop Assessment: no apparent nausea or vomiting Anesthetic complications: no   No notable events documented.  Last Vitals:  Vitals:   11/29/21 1415 11/29/21 1515  BP: 113/72   Pulse: 71   Resp: 16   Temp: 36.7 C   SpO2: 95% 98%    Last Pain:  Vitals:   11/29/21 1415  TempSrc:   PainSc: Asleep                 Casey Lang DANIEL

## 2021-11-29 NOTE — Interval H&P Note (Signed)
History and Physical Interval Note:  11/29/2021 7:54 AM  Casey Lang  has presented today for surgery, with the diagnosis of LEFT BREAST DCIS.  The various methods of treatment have been discussed with the patient and family. After consideration of risks, benefits and other options for treatment, the patient has consented to  Procedure(s): LEFT MASTECTOMY WITH SENTINEL LYMPH NODE BIOPSY (Left) RIGHT TOTAL MASTECTOMY (Right) as a surgical intervention.  The patient's history has been reviewed, patient examined, no change in status, stable for surgery.  I have reviewed the patient's chart and labs.  Questions were answered to the patient's satisfaction.     Autumn Messing III

## 2021-11-29 NOTE — Anesthesia Procedure Notes (Signed)
Procedure Name: Intubation Date/Time: 11/29/2021 8:29 AM  Performed by: Willa Frater, CRNAPre-anesthesia Checklist: Patient identified, Emergency Drugs available, Suction available and Patient being monitored Patient Re-evaluated:Patient Re-evaluated prior to induction Oxygen Delivery Method: Circle system utilized Preoxygenation: Pre-oxygenation with 100% oxygen Induction Type: IV induction Ventilation: Mask ventilation without difficulty Tube type: Oral Tube size: 7.0 mm Number of attempts: 1 Airway Equipment and Method: Stylet and Oral airway Placement Confirmation: ETT inserted through vocal cords under direct vision, positive ETCO2 and breath sounds checked- equal and bilateral Secured at: 22 cm Tube secured with: Tape Dental Injury: Teeth and Oropharynx as per pre-operative assessment

## 2021-11-29 NOTE — Progress Notes (Signed)
Assisted Dr. Daiva Huge with bilateral pectoralis ultrasound guided blocks. Side rails up, monitors on throughout procedure. See vital signs n flow sheet. Tolerated Procedure well.

## 2021-11-30 ENCOUNTER — Encounter (HOSPITAL_BASED_OUTPATIENT_CLINIC_OR_DEPARTMENT_OTHER): Payer: Self-pay | Admitting: General Surgery

## 2021-11-30 DIAGNOSIS — D0512 Intraductal carcinoma in situ of left breast: Secondary | ICD-10-CM | POA: Diagnosis not present

## 2021-11-30 MED ORDER — METHOCARBAMOL 500 MG PO TABS: 500.0000 mg | ORAL_TABLET | Freq: Four times a day (QID) | ORAL | 2 refills | Status: DC | PRN

## 2021-11-30 MED ORDER — TRAMADOL HCL 50 MG PO TABS: 50.0000 mg | ORAL_TABLET | Freq: Four times a day (QID) | ORAL | 1 refills | Status: DC | PRN

## 2021-11-30 NOTE — Progress Notes (Signed)
1 Day Post-Op   Subjective/Chief Complaint: No complaints other than soreness   Objective: Vital signs in last 24 hours: Temp:  [97.7 F (36.5 C)-98.5 F (36.9 C)] 97.7 F (36.5 C) (10/20 0500) Pulse Rate:  [50-95] 85 (10/20 0745) Resp:  [9-19] 16 (10/20 0500) BP: (90-135)/(55-100) 97/68 (10/20 0500) SpO2:  [92 %-100 %] 97 % (10/20 0745)    Intake/Output from previous day: 10/19 0701 - 10/20 0700 In: 2420 [P.O.:720; I.V.:1600; IV Piggyback:100] Out: 1512 [Urine:1150; Drains:292; Blood:70] Intake/Output this shift: Total I/O In: 240 [P.O.:240] Out: -   General appearance: alert and cooperative Resp: clear to auscultation bilaterally Chest wall: skin flaps look good Cardio: regular rate and rhythm GI: soft, non-tender; bowel sounds normal; no masses,  no organomegaly  Lab Results:  No results for input(s): "WBC", "HGB", "HCT", "PLT" in the last 72 hours. BMET No results for input(s): "NA", "K", "CL", "CO2", "GLUCOSE", "BUN", "CREATININE", "CALCIUM" in the last 72 hours. PT/INR No results for input(s): "LABPROT", "INR" in the last 72 hours. ABG No results for input(s): "PHART", "HCO3" in the last 72 hours.  Invalid input(s): "PCO2", "PO2"  Studies/Results: No results found.  Anti-infectives: Anti-infectives (From admission, onward)    Start     Dose/Rate Route Frequency Ordered Stop   11/29/21 0645  ceFAZolin (ANCEF) IVPB 2g/100 mL premix        2 g 200 mL/hr over 30 Minutes Intravenous On call to O.R. 11/29/21 0631 11/29/21 0840       Assessment/Plan: s/p Procedure(s): LEFT MASTECTOMY WITH SENTINEL LYMPH NODE BIOPSY (Left) RIGHT TOTAL MASTECTOMY (Right) Advance diet Discharge  LOS: 0 days    Autumn Messing III 11/30/2021

## 2021-12-03 LAB — SURGICAL PATHOLOGY

## 2021-12-06 ENCOUNTER — Encounter: Payer: Self-pay | Admitting: *Deleted

## 2021-12-06 DIAGNOSIS — D0512 Intraductal carcinoma in situ of left breast: Secondary | ICD-10-CM

## 2021-12-07 NOTE — Progress Notes (Signed)
Patient Care Team: Jeanie Sewer, NP as PCP - General (Family Medicine) Sheralyn Boatman, MD as Consulting Physician (Psychiatry) Rockwell Germany, RN as Oncology Nurse Navigator Mauro Kaufmann, RN as Oncology Nurse Navigator  DIAGNOSIS: No diagnosis found.  SUMMARY OF ONCOLOGIC HISTORY: Oncology History  Ductal carcinoma in situ (DCIS) of left breast  11/02/2021 Initial Diagnosis   Screening mammogram detected left breast calcifications measuring 18.6 cm in span.  2 biopsies were performed both anterior and posterior revealed high-grade DCIS with solid cribriform and micropapillary features with necrosis ER 95%, PR 80% to 50%   11/14/2021 Cancer Staging   Staging form: Breast, AJCC 8th Edition - Clinical: Stage 0 (cTis (DCIS), cN0, cM0, G3, ER+, PR+, HER2: Not Assessed) - Signed by Nicholas Lose, MD on 11/14/2021 Histologic grading system: 3 grade system    Genetic Testing   [facility] CustomNext Panel+RNA was Negative. Report date is 11/22/2021.  The CustomNext-Cancer+RNAinsight panel offered by [facility] includes sequencing and rearrangement analysis for the following 47 genes:  APC, ATM, AXIN2, BARD1, BMPR1A, BRCA1, BRCA2, BRIP1, CDH1, CDK4, CDKN2A, CHEK2, CTNNA1, DICER1, EPCAM, GREM1, HOXB13, KIT, MEN1, MLH1, MSH2, MSH3, MSH6, MUTYH, NBN, NF1, NTHL1, PALB2, PDGFRA, PMS2, POLD1, POLE, PTEN, RAD50, RAD51C, RAD51D, SDHA, SDHB, SDHC, SDHD, SMAD4, SMARCA4, STK11, TP53, TSC1, TSC2, and VHL.  RNA data is routinely analyzed for use in variant interpretation for all genes.     CHIEF COMPLIANT: Follow-up left breast DCIS  INTERVAL HISTORY: Casey Lang is a 41 y.o. female is here because of recent diagnosis of left breast DCIS. She presents to the clinic for a follow-up.    ALLERGIES:  is allergic to morphine and related.  MEDICATIONS:  Current Outpatient Medications  Medication Sig Dispense Refill   Biotin (YUMVS BIOTIN MAX POTENCY) 5000 MCG CHEW Chew by mouth.      Calcium Carb-Cholecalciferol (OYSTER SHELL CALCIUM W/D) 500-5 MG-MCG TABS Take by mouth.     clonazePAM (KLONOPIN) 0.5 MG tablet Take 0.5 mg by mouth 2 (two) times daily as needed for anxiety.     ferrous sulfate 325 (65 FE) MG tablet Take 325 mg by mouth daily with breakfast.     gabapentin (NEURONTIN) 100 MG capsule Take 100 mg by mouth 3 (three) times daily.     Lactobacillus Rhamnosus, GG, (CULTURELLE) CAPS Take 1 capsule by mouth daily.     methocarbamol (ROBAXIN) 500 MG tablet Take 1 tablet (500 mg total) by mouth every 6 (six) hours as needed for muscle spasms. 30 tablet 2   Multiple Vitamin (MULTI-VITAMIN) tablet Take 1 tablet by mouth daily.     Omega-3 1000 MG CAPS Take by mouth.     QUEtiapine (SEROQUEL) 25 MG tablet Take 25 mg by mouth. Three tabs daily /total of 75 mg per patient     traMADol (ULTRAM) 50 MG tablet Take 1 tablet (50 mg total) by mouth every 6 (six) hours as needed (mild pain). 20 tablet 1   Turmeric 400 MG CAPS Take by mouth.     No current facility-administered medications for this visit.    PHYSICAL EXAMINATION: ECOG PERFORMANCE STATUS: {CHL ONC ECOG PS:[phone]}  There were no vitals filed for this visit. There were no vitals filed for this visit.  BREAST:*** No palpable masses or nodules in either right or left breasts. No palpable axillary supraclavicular or infraclavicular adenopathy no breast tenderness or nipple discharge. (exam performed in the presence of a chaperone)  LABORATORY DATA:  I have reviewed the data as listed  Latest Ref Rng & Units 11/14/2021    8:06 AM 06/11/2021   12:00 AM 07/17/2016    7:46 AM  CMP  Glucose 70 - 99 mg/dL 74   110   BUN 6 - 20 mg/dL _0 Creatinine 0.44 - 1.00 mg/dL 1.03  1.0     0.83   Sodium 135 - 145 mmol/L 141  141     135   Potassium 3.5 - 5.1 mmol/L 3.9  4.1     4.5   Chloride 98 - 111 mmol/L 106  104     106   CO2 22 - 32 mmol/L 32  27     22   Calcium 8.9 - 10.3 mg/dL 9.1  9.3     8.8    Total Protein 6.5 - 8.1 g/dL 6.4   6.7   Total Bilirubin 0.3 - 1.2 mg/dL 0.9   0.5   Alkaline Phos 38 - 126 U/L 43  55     78   AST 15 - 41 U/L _1 ALT 0 - 44 U/L _2 This result is from an external source.    Lab Results  Component Value Date   WBC 2.6 (L) 11/14/2021   HGB 11.5 (L) 11/14/2021   HCT 34.6 (L) 11/14/2021   MCV 94.8 11/14/2021   PLT 234 11/14/2021   NEUTROABS 1.2 (L) 11/14/2021    ASSESSMENT & PLAN:  No problem-specific Assessment & Plan notes found for this encounter.    No orders of the defined types were placed in this encounter.  The patient has a good understanding of the overall plan. she agrees with it. she will call with any problems that may develop before the next visit here. Total time spent: 30 mins including face to face time and time spent for planning, charting and co-ordination of care   Suzzette Righter, [place] 12/07/21    I Gardiner Coins am scribing for Dr. Lindi Adie  ***

## 2021-12-10 ENCOUNTER — Inpatient Hospital Stay (HOSPITAL_BASED_OUTPATIENT_CLINIC_OR_DEPARTMENT_OTHER): Payer: Medicaid Other | Admitting: Hematology and Oncology

## 2021-12-10 ENCOUNTER — Encounter: Payer: Self-pay | Admitting: Genetic Counselor

## 2021-12-10 VITALS — BP 126/77 | HR 63 | Temp 97.5°F | Resp 18 | Ht 66.0 in | Wt 224.1 lb

## 2021-12-10 DIAGNOSIS — D709 Neutropenia, unspecified: Secondary | ICD-10-CM

## 2021-12-10 DIAGNOSIS — D0512 Intraductal carcinoma in situ of left breast: Secondary | ICD-10-CM

## 2021-12-10 NOTE — Assessment & Plan Note (Addendum)
11/02/2021:Screening mammogram detected left breast calcifications measuring 18.6 cm in span.  2 biopsies were performed both anterior and posterior revealed high-grade DCIS with solid cribriform and micropapillary features with necrosis ER 95%, PR 80% to 50%  11/29/2021: Bilateral mastectomies Right mastectomy: Benign Left mastectomy: High-grade DCIS with solid and papillary types, 0/7 lymph nodes negative ER 95%, PR 80% strong staining  Pathology counseling: I discussed the final pathology report of the patient provided  a copy of this report. I discussed the margins.  We also discussed the final staging along with previously performed ER/PR testing.  Treatment plan: With her having undergone bilateral mastectomies and no evidence of invasive cancer, there is no role for antiestrogen therapy.  Return to clinic on an as-needed basis.

## 2021-12-12 ENCOUNTER — Telehealth: Payer: Self-pay

## 2021-12-12 NOTE — Telephone Encounter (Signed)
Left message on voicemail @ [place] requesting a return call regarding patient's medicaid status, and future.

## 2021-12-13 ENCOUNTER — Telehealth: Payer: Self-pay

## 2021-12-13 NOTE — Telephone Encounter (Signed)
I spoke with patient regarding [facility] [place]. Per Dagmar Hait @ [facility], patient was informed that current medicaid benefits are being extended, will be requested to provide income verification, which will extend benefits another month. [facility] medicaid was completed, currently being held until further notice from [employer]. Sharyn Lull Pharmacologist) informed. Patient verbalized understanding.

## 2021-12-21 ENCOUNTER — Ambulatory Visit: Payer: Medicaid Other

## 2021-12-25 NOTE — Therapy (Signed)
OUTPATIENT PHYSICAL THERAPY BREAST CANCER POST OP FOLLOW UP   Patient Name: Casey Lang MRN: [id number] DOB:19-Mar-1980, 41 y.o., female Today's Date: 12/26/2021   PT End of Session - 12/26/21 1355     Visit Number 2    Number of Visits 2    Date for PT Re-Evaluation 12/26/21    PT Start Time 1358    PT Stop Time 1500    PT Time Calculation (min) 62 min    Activity Tolerance Patient tolerated treatment well    Behavior During Therapy [facility] for tasks assessed/performed             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   Depression    Hypertension    pregnancy induced hypertension   Leukopenia 10/19/2021   Mental disorder    PTSD (post-traumatic stress disorder)    Right foot injury 10/09/2012   Resolved without complication   Past Surgical History:  Procedure Laterality Date   FOOT SURGERY     HEMATOMA EVACUATION N/A 07/18/2016   Procedure: EVACUATION HEMATOMA;  Surgeon: Reva Bores, MD;  Location: [facility];  Service: Gynecology;  Laterality: N/A;   LAPAROSCOPIC GASTRIC SLEEVE RESECTION  10/09/2012   Surgery was on 09/30/2011   MASTECTOMY W/ SENTINEL NODE BIOPSY Left 11/29/2021   Procedure: LEFT MASTECTOMY WITH SENTINEL LYMPH NODE BIOPSY;  Surgeon: Griselda Miner, MD;  Location: [facility];  Service: General;  Laterality: Left;   TOTAL MASTECTOMY Right 11/29/2021   Procedure: RIGHT TOTAL MASTECTOMY;  Surgeon: Griselda Miner, MD;  Location: [facility];  Service: General;  Laterality: Right;   TUBAL LIGATION Bilateral 07/18/2016   Procedure: POST PARTUM TUBAL LIGATION;  Surgeon: Reva Bores, MD;  Location: [facility];  Service: Gynecology;  Laterality: Bilateral;   Patient Active Problem List   Diagnosis Date Noted   Genetic testing 11/26/2021   Family history of breast cancer  11/14/2021   Ductal carcinoma in situ (DCIS) of left breast 11/12/2021   Leukopenia 10/19/2021   Iron deficiency anemia due to chronic blood loss 06/11/2021   Gestational hypertension 07/17/2016   Gestational hypertension, third trimester 05/03/2016   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   Bipolar I disorder, most recent episode (or current) manic (HCC) 03/22/2015   PTSD (post-traumatic stress disorder)    GAD (generalized anxiety disorder)    Hyperprolactinemia (HCC) 03/02/2015   Bipolar disorder, curr episode mixed, severe, with psychotic features (HCC) 02/28/2015    PCP: Larna Daughters MD  REFERRING PROVIDER: Griselda Miner, MD  REFERRING DIAG: Left Breast Cancer   THERAPY DIAG:  Malignant neoplasm of upper-outer quadrant of left breast in female, estrogen receptor positive (HCC)  Abnormal posture  Rationale for Evaluation and Treatment: Rehabilitation  ONSET DATE: 08/24/2021  SUBJECTIVE:  SUBJECTIVE STATEMENT: Pt reports getting drains out on Monday. I have gone from working out 5-6 days per week to nothing during my surgery. I tried to get back to walking but I was exhausted. I started doing the exs after the drains came out on Monday. 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 I am feeling pretty good overall.  PERTINENT HISTORY:  Patient was diagnosed on 08/24/2021 with left high grade DCIS. It measures 18.6 cm and is located in the upper outer quadrant. It is ER/PR positive. Patient elected for bilateral mastectomies, completed 11/29/2021. Final pathology right breast benign, left breast two areas high grade DCIS, margins clear, 0/7 SLN. Drains removed 12/24/2021. 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  PAIN:  Are you having pain? No but I took tramadol and robaxin around noon today.  PRECAUTIONS: Recent Surgery, left UE Lymphedema risk,   ACTIVITY LEVEL / LEISURE: started back to some walking and light elliptical   OBJECTIVE:   PATIENT SURVEYS:  QUICK DASH: 0%  OBSERVATIONS: Dog ears bilateral lateral trunk,  and extra tissue noted medial incisions greatest on left..Bilateral breast incisions healing well. Glue and several small scabs still present. Bandaids covering drain areas  POSTURE:  Forward head, rounded shoulders  LYMPHEDEMA ASSESSMENT:    UPPER EXTREMITY AROM/PROM:   A/PROM RIGHT   eval   Right 12/26/2021  Shoulder extension 51 65  Shoulder flexion 162 165  Shoulder abduction 177 169  Shoulder internal rotation 75 75  Shoulder external rotation 80 92                          (Blank rows = not tested)   A/PROM LEFT   eval LEFT 12/26/2021  Shoulder extension 62 64  Shoulder flexion 152 154  Shoulder abduction 176 158  Shoulder internal rotation 85 60  Shoulder external rotation 62 90                          (Blank rows = not tested)     CERVICAL AROM: All within normal limits   UPPER EXTREMITY STRENGTH: WNL     LYMPHEDEMA ASSESSMENTS:    LANDMARK RIGHT   eval Right 12/26/2021  10 cm proximal to olecranon process 31.8 33.7  Olecranon process 25.6 26.4  10 cm proximal to ulnar styloid process 23.6 23.8  Just proximal to ulnar styloid process 15.8 16.4  Across hand at thumb web space 19.9 21.2  At base of 2nd digit 6.2 6.2  (Blank rows = not tested)   LANDMARK LEFT   eval LEFT 12/26/2021  10 cm proximal to olecranon process 32.3 31.7  Olecranon process 25.8 26  10  cm proximal to ulnar styloid process 23.3 22.7  Just proximal to ulnar styloid  process 15.7 15.9  Across hand at thumb web space 20.1 20.6  At base of 2nd digit 6 6.2  (Blank rows = not tested)      Surgery type/Date: Bilateral mastectomy for left Breast Cancer, Right prophylactic Number of lymph nodes removed: 0/7 on Left  Current/past treatment (chemo, radiation, hormone therapy): none Other symptoms:  Heaviness/tightness Yesintermittently Pain Yes, not presently secondary to tramadol Pitting edema No Infections No  Decreased scar mobility Yes Stemmer sign No  PATIENT EDUCATION:  Education details: scar massage when scabs are fully off, ABC class, SOZO screens, reviewed exercises,compression bra, foam pads, posture Access Code: [email or web address] URL: [email or web address] Date: 12/26/2021 Prepared by: Alvira Monday  Exercises - Single Arm Doorway Pec Stretch at 90 Degrees Abduction  - 1 x daily - 7 x weekly - 1 sets - 3 reps - 15 hold - Standing 'L' Stretch at Counter  - 2 x daily - 7 x weekly - 1 sets - 3 reps - 15 hold  Person educated: Patient Education method: Explanation, Demonstration, and Handouts Education comprehension: verbalized understanding and returned demonstration  HOME EXERCISE PROGRAM: Reviewed previously given post op HEP. Showed pt how to perform wall slides for abd properly., Added gentle standing lat stretch and single arm pec stretch to HEP. Gave pt foam pad for distal border of bra to prevent rolling and small foam for left medial incision to try and flatten. Educated in importance of proper posture multiple times.   ASSESSMENT:  CLINICAL IMPRESSION: Pt is s/p bilateral mastectomy with left SLNB on 11/29/2021. She had drains removed 2 days ago and started performing her exercises that day. Her ROM is doing exceptionally well and she is WNL for all except for mild limitations in bilateral shoulder abduction. She is very motivated and she was shown how to step in to get the last few degrees of ROM. Advised pt not to hurry her  healing as she is doing so well. We discussed the importance of proper posture, wearing her compression bra until swelling is down, and slowly getting back to her exercise program.  She will attend ABC class on Monday, and she was set up for her SOZO screen.  Foam pads were made for her bra to prevent rolling and to try and flatten left medial tissue that is slightly prominent.She feels comfortable being released to a HEP  Pt will benefit from skilled therapeutic intervention to improve on the following deficits: Decreased knowledge of precautions, impaired UE functional use, pain, decreased ROM, postural dysfunction.   PT treatment/interventions: ADL/Self care home management, Therapeutic exercises, Patient/Family education, Self Care, and scar mobilization   GOALS: Goals reviewed with patient? Yes  LONG TERM GOALS:  (STG=LTG)  GOALS Name Target Date  Goal status  1 Pt will demonstrate she has regained full shoulder ROM and function post operatively compared to baselines.  Baseline: 12/26/2021 MET for all except abduction and pt was shown how to progress to end range.  2     3     4         PLAN:  PT FREQUENCY/DURATION: Pt is doing exceptionally well. She will continue to work on abd ROM. She was advised to call with questions or concerns. No further needs identified at this time  PLAN FOR NEXT SESSION: Pt is discharged to HEP and independent self management. She should call with questions or concerns.  PHYSICAL THERAPY DISCHARGE SUMMARY  Visits from Start of Care: 2  Current functional level related to goals / functional outcomes: Nearly achieved goals   Remaining deficits: Lacking a small amount of bilateral shoulder abduction but able to get to full ROM after showing how to advance wall slide, discussion on importance f proper posture   Education / Equipment: HEP, foam pads for bra , POSTURE!!  Patient agrees to discharge. Patient goals were nearly met. Patient is being  discharged due to being pleased with the current functional level.  [facility]  [address], [address]  [place] [address]  [phone]  After Breast Cancer Class: Nov 20, 12:00 It is recommended you attend the ABC class to be educated on lymphedema risk reduction. This class is free of charge and lasts for 1 hour. It is a 1-time class. You will need to download the Webex app either on your phone or computer. We will send you a link the night before or the morning of the class. You should be able to click on that link to join the class. This is not a confidential class. You don't have to turn your camera on, but other participants may be able to see your email address.  Scar massage You can begin gentle scar massage to you incision sites. Gently place one hand on the incision and move the skin (without sliding on the skin) in various directions. Do this for a few minutes and then you can gently massage either coconut oil or vitamin E cream into the scars.  Compression garment You should continue wearing your compression bra until you feel like you no longer have swelling.  Home exercise Program Continue doing the exercises you were given until you feel like you can do them without feeling any tightness at the end.   Walking Program Studies show that 30 minutes of walking per day (fast enough to elevate your heart rate) can significantly reduce the risk of a cancer recurrence. If you can't walk due to other medical reasons, we encourage you to find another activity you could do (like a stationary bike or water exercise).  Posture After breast cancer surgery, people frequently sit with rounded shoulders posture because it puts their incisions on slack and feels better. If you sit like this and scar tissue forms in that position, you can become very tight and have pain sitting or standing with good posture. Try to be aware of your posture and sit and stand up tall to  heal properly.  Follow up PT: It is recommended you return every 3 months for the first 3 years following surgery to be assessed on the SOZO machine for an L-Dex score. This helps prevent clinically significant lymphedema in 95% of patients. These follow up screens are 10 minute appointments that you are not billed for.  Waynette Buttery, PT 12/26/2021, 4:15 PM

## 2021-12-26 ENCOUNTER — Ambulatory Visit: Payer: Medicaid Other | Attending: General Surgery

## 2021-12-26 DIAGNOSIS — Z170 Estrogen receptor positive status [ER+]: Secondary | ICD-10-CM | POA: Insufficient documentation

## 2021-12-26 DIAGNOSIS — C50412 Malignant neoplasm of upper-outer quadrant of left female breast: Secondary | ICD-10-CM | POA: Insufficient documentation

## 2021-12-26 DIAGNOSIS — R293 Abnormal posture: Secondary | ICD-10-CM | POA: Diagnosis present

## 2021-12-26 NOTE — Patient Instructions (Addendum)
     [facility]  [address], [address]  [place] [address]  [phone]  After Breast Cancer Class It is recommended you attend the ABC class to be educated on lymphedema risk reduction. This class is free of charge and lasts for 1 hour. It is a 1-time class. You will need to download the Webex app either on your phone or computer. We will send you a link the night before or the morning of the class. You should be able to click on that link to join the class. This is not a confidential class. You don't have to turn your camera on, but other participants may be able to see your email address.  Scar massage You can begin gentle scar massage to you incision sites. Gently place one hand on the incision and move the skin (without sliding on the skin) in various directions. Do this for a few minutes and then you can gently massage either coconut oil or vitamin E cream into the scars.  Compression garment You should continue wearing your compression bra until you feel like you no longer have swelling.  Home exercise Program Continue doing the exercises you were given until you feel like you can do them without feeling any tightness at the end.   Walking Program Studies show that 30 minutes of walking per day (fast enough to elevate your heart rate) can significantly reduce the risk of a cancer recurrence. If you can't walk due to other medical reasons, we encourage you to find another activity you could do (like a stationary bike or water exercise).  Posture After breast cancer surgery, people frequently sit with rounded shoulders posture because it puts their incisions on slack and feels better. If you sit like this and scar tissue forms in that position, you can become very tight and have pain sitting or standing with good posture. Try to be aware of your posture and sit and stand up tall to heal properly.  Follow up PT: It is recommended you return every 3 months for  the first 2 years following surgery to be assessed on the SOZO machine for an L-Dex score. This helps prevent clinically significant lymphedema in 95% of patients. These follow up screens are 10 minute appointments that you are not billed for.  Access Code: [email or web address] URL: [email or web address] Date: 12/26/2021 Prepared by: Cheral Almas  Exercises - Single Arm Doorway Pec Stretch at 90 Degrees Abduction  - 1 x daily - 7 x weekly - 1 sets - 3 reps - 15 hold - Standing 'L' Stretch at Counter  - 2 x daily - 7 x weekly - 1 sets - 3 reps - 15 hold

## 2022-01-22 ENCOUNTER — Other Ambulatory Visit: Payer: Medicaid Other

## 2022-01-22 ENCOUNTER — Ambulatory Visit: Payer: Medicaid Other | Admitting: Hematology and Oncology

## 2022-02-25 ENCOUNTER — Ambulatory Visit: Payer: Medicaid Other | Admitting: Physical Therapy

## 2022-02-27 NOTE — H&P (Signed)
Subjective:     Patient ID: Casey Lang is a 42 y.o. female.   HPI   Last seen 12/2021. Presented following screening MMG 2023 showing left breast calcifications measuring 18.6 cm in span.  Two biopsies both revealed high-grade DCIS ER/PR+. Patient elected for bilateral mastectomies, completed 10.19/2023. Final pathology right breast benign, left breast two areas high grade DCIS, margins clear, 0/7 SLN.   Genetics negative   Prior to mastectomies 38G. Right mastectomy 2,239 g Left mastectomy 2,155 g    Highest weight 328 lb.Underwent sleeve gastroplasty 2013. Lowest weight following this 220. Stable over last year   PMH significant for PTSD, chronic anemia (Hb 11.5 11/2021), bipolar d/o.   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 Previously worked at [employer] and [employer]. Lives with mother and 39 yo son.   Review of Systems      Objective:   Physical Exam Cardiovascular:     Rate and Rhythm: Normal rate and regular rhythm.     Heart sounds: Normal heart sounds.  Pulmonary:     Effort: Pulmonary effort is normal.     Breath sounds: Normal breath sounds.  Skin:    Comments: Fitzpatrick 6       Chest: bilateral breast absent scars flat fine lines maturing redundant soft tissue medial and lateral extents of transverse chest incisions    Assessment:     DCIS left breast S/ bilateral mastectomies Left SLN    Plan:     Desires flat closure chest, no reconstruction.   Plan direct excision medial and lateral extents soft tissue. Scar will extend further posteriorly and may cross midline. Reviewed in mirror anticipated length scars and realistic appearance lateral chest wall ie will not be completely flat. Reviewed OP surgery no drains anticipated. Reviewed time off work.   Additional risks including but not limited to bleeding, hematoma, seroma, infection, wound healing problems, need for additional procedures, damage to adjacent structures,  blood clots in legs or lungs reviewed.

## 2022-02-28 ENCOUNTER — Encounter (HOSPITAL_BASED_OUTPATIENT_CLINIC_OR_DEPARTMENT_OTHER): Payer: Self-pay | Admitting: Plastic Surgery

## 2022-02-28 ENCOUNTER — Other Ambulatory Visit: Payer: Self-pay

## 2022-03-04 ENCOUNTER — Ambulatory Visit: Payer: Medicaid Other | Attending: General Surgery

## 2022-03-04 VITALS — Wt 228.0 lb

## 2022-03-04 DIAGNOSIS — Z483 Aftercare following surgery for neoplasm: Secondary | ICD-10-CM

## 2022-03-04 NOTE — Progress Notes (Signed)
CHG soap and Presurgery Ensure given to patient with written instructions for both. Confirmed time of drink, NPO instructions and time of arrival. Patient confirmed that she has someone to drive her home and stay with her overnight if she does not need to stay overnight in [place].

## 2022-03-04 NOTE — Therapy (Signed)
OUTPATIENT PHYSICAL THERAPY SOZO SCREENING NOTE   Patient Name: Casey Lang MRN: [id number] DOB:1980-12-09, 42 y.o., female Today's Date: 03/04/2022  PCP: Jeanie Sewer, NP REFERRING PROVIDER: Jovita Kussmaul, MD   PT End of Session - 03/04/22 1628     Visit Number 2   # unchanged due to screen only   PT Start Time 1627    PT Stop Time 1632    PT Time Calculation (min) 5 min    Activity Tolerance Patient tolerated treatment well    Behavior During Therapy [facility] for tasks assessed/performed             Past Medical History:  Diagnosis Date   Anxiety    Bipolar 1 disorder ([place])    Breast cancer ([place])    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   Depression    Hypertension    pregnancy induced hypertension   Leukopenia 10/19/2021   Mental disorder    PTSD (post-traumatic stress disorder)    Right foot injury 10/09/2012   Resolved without complication   Past Surgical History:  Procedure Laterality Date   FOOT SURGERY     HEMATOMA EVACUATION N/A 07/18/2016   Procedure: EVACUATION HEMATOMA;  Surgeon: Donnamae Jude, MD;  Location: [place];  Service: Gynecology;  Laterality: N/A;   LAPAROSCOPIC GASTRIC SLEEVE RESECTION  10/09/2012   Surgery was on 09/30/2011   MASTECTOMY W/ SENTINEL NODE BIOPSY Left 11/29/2021   Procedure: LEFT MASTECTOMY WITH SENTINEL LYMPH NODE BIOPSY;  Surgeon: Jovita Kussmaul, MD;  Location: [place];  Service: General;  Laterality: Left;   TOTAL MASTECTOMY Right 11/29/2021   Procedure: RIGHT TOTAL MASTECTOMY;  Surgeon: Jovita Kussmaul, MD;  Location: [place];  Service: General;  Laterality: Right;   TUBAL LIGATION Bilateral 07/18/2016   Procedure: POST PARTUM TUBAL LIGATION;  Surgeon: Donnamae Jude, MD;  Location: [place];  Service: Gynecology;  Laterality: Bilateral;   Patient Active Problem List   Diagnosis Date Noted   Genetic testing 11/26/2021   Family  history of breast cancer 11/14/2021   Ductal carcinoma in situ (DCIS) of left breast 11/12/2021   Leukopenia 10/19/2021   Iron deficiency anemia due to chronic blood loss 06/11/2021   Gestational hypertension 07/17/2016   Gestational hypertension, third trimester 05/03/2016   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 It measures 18.6 cm and is located in the upper outer quadrant. It is ER/PR positive. Patient elected for bilateral mastectomies, completed 11/29/2021. Final pathology right breast benign, left breast two areas high grade DCIS, margins clear, 0/7 SLN. Drains removed 12/24/2021. 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   PAIN:  Are you having pain? No  SOZO SCREENING: Patient was assessed today using the  SOZO machine to determine the lymphedema index score. This was compared to her baseline score. It was determined that she is within the recommended range when compared to her baseline and no further action is needed at this time. She will continue SOZO screenings. These are done every 3 months for 2 years post operatively followed by every 6 months for 2 years, and then annually.  Issued pt info, per her request, on how to order a compression  sleeve from [email or web address] and to measure herself for a [employer]. Pt knows to call back if she has any further questions regarding this.    L-DEX FLOWSHEETS - 03/04/22 1600       L-DEX LYMPHEDEMA SCREENING   Measurement Type Unilateral    L-DEX MEASUREMENT EXTREMITY Upper Extremity    POSITION  Standing    DOMINANT SIDE Right    At Risk Side Left    BASELINE SCORE (UNILATERAL) 3.5    L-DEX SCORE (UNILATERAL) 1.6    VALUE CHANGE (UNILAT) -1.9              Otelia Limes, PTA 03/04/2022, 4:33 PM

## 2022-03-07 MED ORDER — PHENYLEPHRINE HCL-NACL 20-0.9 MG/250ML-% IV SOLN
INTRAVENOUS | Status: AC
  Filled 2022-03-07: qty 500

## 2022-03-08 ENCOUNTER — Other Ambulatory Visit: Payer: Self-pay

## 2022-03-08 ENCOUNTER — Ambulatory Visit (HOSPITAL_BASED_OUTPATIENT_CLINIC_OR_DEPARTMENT_OTHER)
Admission: RE | Admit: 2022-03-08 | Discharge: 2022-03-08 | Disposition: A | Discharge status: Home / Self Care | Payer: Medicaid Other | Attending: Plastic Surgery | Admitting: Plastic Surgery

## 2022-03-08 ENCOUNTER — Ambulatory Visit (HOSPITAL_BASED_OUTPATIENT_CLINIC_OR_DEPARTMENT_OTHER): Payer: Medicaid Other | Admitting: Certified Registered Nurse Anesthetist

## 2022-03-08 ENCOUNTER — Encounter (HOSPITAL_BASED_OUTPATIENT_CLINIC_OR_DEPARTMENT_OTHER)
Admission: RE | Disposition: A | Discharge status: Home / Self Care | Payer: Self-pay | Source: Home / Self Care | Attending: Plastic Surgery

## 2022-03-08 ENCOUNTER — Encounter (HOSPITAL_BASED_OUTPATIENT_CLINIC_OR_DEPARTMENT_OTHER): Payer: Self-pay | Admitting: Plastic Surgery

## 2022-03-08 DIAGNOSIS — F319 Bipolar disorder, unspecified: Secondary | ICD-10-CM | POA: Diagnosis not present

## 2022-03-08 DIAGNOSIS — I10 Essential (primary) hypertension: Secondary | ICD-10-CM | POA: Diagnosis not present

## 2022-03-08 DIAGNOSIS — Z9013 Acquired absence of bilateral breasts and nipples: Secondary | ICD-10-CM | POA: Diagnosis not present

## 2022-03-08 DIAGNOSIS — Z853 Personal history of malignant neoplasm of breast: Secondary | ICD-10-CM | POA: Insufficient documentation

## 2022-03-08 DIAGNOSIS — Z01818 Encounter for other preprocedural examination: Secondary | ICD-10-CM

## 2022-03-08 DIAGNOSIS — F4310 Post-traumatic stress disorder, unspecified: Secondary | ICD-10-CM | POA: Diagnosis not present

## 2022-03-08 DIAGNOSIS — Z421 Encounter for breast reconstruction following mastectomy: Secondary | ICD-10-CM | POA: Diagnosis present

## 2022-03-08 DIAGNOSIS — F419 Anxiety disorder, unspecified: Secondary | ICD-10-CM | POA: Insufficient documentation

## 2022-03-08 HISTORY — PX: LESION EXCISION WITH COMPLEX REPAIR: SHX6700

## 2022-03-08 LAB — POCT PREGNANCY, URINE: Preg Test, Ur: NEGATIVE

## 2022-03-08 SURGERY — LESION EXCISION WITH COMPLEX REPAIR
Anesthesia: General | Site: Chest | Laterality: Bilateral

## 2022-03-08 MED ORDER — PROPOFOL 10 MG/ML IV BOLUS
INTRAVENOUS | Status: DC | PRN
  Administered 2022-03-08: 200 mg via INTRAVENOUS

## 2022-03-08 MED ORDER — PROPOFOL 10 MG/ML IV BOLUS
INTRAVENOUS | Status: AC
  Filled 2022-03-08: qty 20

## 2022-03-08 MED ORDER — GABAPENTIN 300 MG PO CAPS
ORAL_CAPSULE | ORAL | Status: AC
  Filled 2022-03-08: qty 1

## 2022-03-08 MED ORDER — CELECOXIB 200 MG PO CAPS
ORAL_CAPSULE | ORAL | Status: AC
  Filled 2022-03-08: qty 1

## 2022-03-08 MED ORDER — LIDOCAINE 2% (20 MG/ML) 5 ML SYRINGE
INTRAMUSCULAR | Status: AC
  Filled 2022-03-08: qty 5

## 2022-03-08 MED ORDER — AMISULPRIDE (ANTIEMETIC) 5 MG/2ML IV SOLN: 10.0000 mg | Freq: Once | INTRAVENOUS | Status: DC | PRN

## 2022-03-08 MED ORDER — DEXAMETHASONE SODIUM PHOSPHATE 10 MG/ML IJ SOLN
INTRAMUSCULAR | Status: AC
  Filled 2022-03-08: qty 1

## 2022-03-08 MED ORDER — ACETAMINOPHEN 500 MG PO TABS
ORAL_TABLET | ORAL | Status: AC
  Filled 2022-03-08: qty 2

## 2022-03-08 MED ORDER — GABAPENTIN 300 MG PO CAPS
300.0000 mg | ORAL_CAPSULE | ORAL | Status: AC
  Administered 2022-03-08: 300 mg via ORAL

## 2022-03-08 MED ORDER — CELECOXIB 200 MG PO CAPS
200.0000 mg | ORAL_CAPSULE | ORAL | Status: AC
  Administered 2022-03-08: 200 mg via ORAL

## 2022-03-08 MED ORDER — CHLORHEXIDINE GLUCONATE CLOTH 2 % EX PADS: 6.0000 | MEDICATED_PAD | Freq: Once | CUTANEOUS | Status: DC

## 2022-03-08 MED ORDER — LACTATED RINGERS IV SOLN: INTRAVENOUS | Status: DC

## 2022-03-08 MED ORDER — BUPIVACAINE-EPINEPHRINE 0.5% -1:200000 IJ SOLN
INTRAMUSCULAR | Status: DC | PRN
  Administered 2022-03-08: 30 mL

## 2022-03-08 MED ORDER — FENTANYL CITRATE (PF) 100 MCG/2ML IJ SOLN
INTRAMUSCULAR | Status: DC | PRN
  Administered 2022-03-08 (×2): 25 ug via INTRAVENOUS
  Administered 2022-03-08: 50 ug via INTRAVENOUS

## 2022-03-08 MED ORDER — PROMETHAZINE HCL 25 MG/ML IJ SOLN: 6.2500 mg | INTRAMUSCULAR | Status: DC | PRN

## 2022-03-08 MED ORDER — BUPIVACAINE HCL (PF) 0.5 % IJ SOLN
INTRAMUSCULAR | Status: AC
  Filled 2022-03-08: qty 30

## 2022-03-08 MED ORDER — DEXMEDETOMIDINE HCL IN NACL 80 MCG/20ML IV SOLN
INTRAVENOUS | Status: DC | PRN
  Administered 2022-03-08: 8 ug via BUCCAL

## 2022-03-08 MED ORDER — ONDANSETRON HCL 4 MG/2ML IJ SOLN
INTRAMUSCULAR | Status: AC
  Filled 2022-03-08: qty 2

## 2022-03-08 MED ORDER — DEXAMETHASONE SODIUM PHOSPHATE 4 MG/ML IJ SOLN
INTRAMUSCULAR | Status: DC | PRN
  Administered 2022-03-08: 8 mg via INTRAVENOUS

## 2022-03-08 MED ORDER — CEFAZOLIN SODIUM-DEXTROSE 2-4 GM/100ML-% IV SOLN
INTRAVENOUS | Status: AC
  Filled 2022-03-08: qty 100

## 2022-03-08 MED ORDER — FENTANYL CITRATE (PF) 100 MCG/2ML IJ SOLN: 25.0000 ug | INTRAMUSCULAR | Status: DC | PRN

## 2022-03-08 MED ORDER — ACETAMINOPHEN 500 MG PO TABS
1000.0000 mg | ORAL_TABLET | ORAL | Status: AC
  Administered 2022-03-08: 1000 mg via ORAL

## 2022-03-08 MED ORDER — GLYCOPYRROLATE 0.2 MG/ML IJ SOLN
INTRAMUSCULAR | Status: DC | PRN
  Administered 2022-03-08: .1 mg via INTRAVENOUS

## 2022-03-08 MED ORDER — LIDOCAINE HCL (CARDIAC) PF 100 MG/5ML IV SOSY
PREFILLED_SYRINGE | INTRAVENOUS | Status: DC | PRN
  Administered 2022-03-08: 80 mg via INTRAVENOUS

## 2022-03-08 MED ORDER — ONDANSETRON HCL 4 MG/2ML IJ SOLN
INTRAMUSCULAR | Status: DC | PRN
  Administered 2022-03-08: 4 mg via INTRAVENOUS

## 2022-03-08 MED ORDER — FENTANYL CITRATE (PF) 100 MCG/2ML IJ SOLN
INTRAMUSCULAR | Status: AC
  Filled 2022-03-08: qty 2

## 2022-03-08 MED ORDER — OXYCODONE-ACETAMINOPHEN 5-325 MG PO TABS: 1.0000 | ORAL_TABLET | ORAL | 0 refills | Status: AC | PRN

## 2022-03-08 MED ORDER — OXYCODONE HCL 5 MG PO TABS: 5.0000 mg | ORAL_TABLET | Freq: Once | ORAL | Status: DC | PRN

## 2022-03-08 MED ORDER — OXYCODONE HCL 5 MG/5ML PO SOLN: 5.0000 mg | Freq: Once | ORAL | Status: DC | PRN

## 2022-03-08 MED ORDER — MIDAZOLAM HCL 5 MG/5ML IJ SOLN
INTRAMUSCULAR | Status: DC | PRN
  Administered 2022-03-08: 2 mg via INTRAVENOUS

## 2022-03-08 MED ORDER — MIDAZOLAM HCL 2 MG/2ML IJ SOLN
INTRAMUSCULAR | Status: AC
  Filled 2022-03-08: qty 2

## 2022-03-08 MED ORDER — CEFAZOLIN SODIUM-DEXTROSE 2-4 GM/100ML-% IV SOLN
2.0000 g | INTRAVENOUS | Status: AC
  Administered 2022-03-08: 2 g via INTRAVENOUS

## 2022-03-08 SURGICAL SUPPLY — 52 items
ADH SKN CLS APL DERMABOND .7 (GAUZE/BANDAGES/DRESSINGS)
BALL CTTN LRG ABS STRL LF (GAUZE/BANDAGES/DRESSINGS)
BLADE CLIPPER SURG (BLADE) IMPLANT
BLADE SURG 15 STRL LF DISP TIS (BLADE) ×1 IMPLANT
BLADE SURG 15 STRL SS (BLADE) ×1
COTTONBALL LRG STERILE PKG (GAUZE/BANDAGES/DRESSINGS) IMPLANT
COVER BACK TABLE 60X90IN (DRAPES) IMPLANT
COVER MAYO STAND STRL (DRAPES) ×1 IMPLANT
DERMABOND ADVANCED .7 DNX12 (GAUZE/BANDAGES/DRESSINGS) IMPLANT
DRAPE LAPAROTOMY 100X72 PEDS (DRAPES) IMPLANT
DRAPE U-SHAPE 76X120 STRL (DRAPES) IMPLANT
DRAPE UTILITY XL STRL (DRAPES) ×1 IMPLANT
ELECT COATED BLADE 2.86 ST (ELECTRODE) IMPLANT
ELECT NDL BLADE 2-5/6 (NEEDLE) ×1 IMPLANT
ELECT NEEDLE BLADE 2-5/6 (NEEDLE) ×1 IMPLANT
ELECT REM PT RETURN 9FT ADLT (ELECTROSURGICAL)
ELECT REM PT RETURN 9FT PED (ELECTROSURGICAL)
ELECTRODE REM PT RETRN 9FT PED (ELECTROSURGICAL) IMPLANT
ELECTRODE REM PT RTRN 9FT ADLT (ELECTROSURGICAL) IMPLANT
GAUZE XEROFORM 1X8 LF (GAUZE/BANDAGES/DRESSINGS) IMPLANT
GAUZE XEROFORM 5X9 LF (GAUZE/BANDAGES/DRESSINGS) IMPLANT
GLOVE BIO SURGEON STRL SZ 6 (GLOVE) ×1 IMPLANT
GOWN STRL REUS W/ TWL LRG LVL3 (GOWN DISPOSABLE) ×2 IMPLANT
GOWN STRL REUS W/TWL LRG LVL3 (GOWN DISPOSABLE) ×2
NDL HYPO 27GX1-1/4 (NEEDLE) IMPLANT
NDL HYPO 30GX1 BEV (NEEDLE) IMPLANT
NEEDLE HYPO 27GX1-1/4 (NEEDLE) IMPLANT
NEEDLE HYPO 30GX1 BEV (NEEDLE) IMPLANT
PACK BASIN DAY SURGERY FS (CUSTOM PROCEDURE TRAY) ×1 IMPLANT
PENCIL SMOKE EVACUATOR (MISCELLANEOUS) ×1 IMPLANT
SHEET MEDIUM DRAPE 40X70 STRL (DRAPES) IMPLANT
SLEEVE SCD COMPRESS KNEE MED (STOCKING) IMPLANT
SPIKE FLUID TRANSFER (MISCELLANEOUS) IMPLANT
SPONGE GAUZE 2X2 8PLY STRL LF (GAUZE/BANDAGES/DRESSINGS) IMPLANT
STRIP CLOSURE SKIN 1/2X4 (GAUZE/BANDAGES/DRESSINGS) IMPLANT
STRIP CLOSURE SKIN 1/4X4 (GAUZE/BANDAGES/DRESSINGS) IMPLANT
SUT ETHILON 2 0 FS 18 (SUTURE) IMPLANT
SUT MNCRL AB 3-0 PS2 18 (SUTURE) IMPLANT
SUT MNCRL AB 4-0 PS2 18 (SUTURE) IMPLANT
SUT PLAIN 5 0 P 3 18 (SUTURE) IMPLANT
SUT PROLENE 5 0 P 3 (SUTURE) IMPLANT
SUT PROLENE 5 0 PS 2 (SUTURE) IMPLANT
SUT PROLENE 6 0 P 1 18 (SUTURE) IMPLANT
SUT VIC AB 3-0 PS1 18 (SUTURE) ×3
SUT VIC AB 3-0 PS1 18XBRD (SUTURE) IMPLANT
SUT VIC AB 3-0 SH 27 (SUTURE)
SUT VIC AB 3-0 SH 27X BRD (SUTURE) IMPLANT
SWABSTICK POVIDONE IODINE SNGL (MISCELLANEOUS) IMPLANT
SYR BULB EAR ULCER 3OZ GRN STR (SYRINGE) IMPLANT
SYR CONTROL 10ML LL (SYRINGE) ×1 IMPLANT
TOWEL GREEN STERILE FF (TOWEL DISPOSABLE) ×1 IMPLANT
TRAY DSU PREP LF (CUSTOM PROCEDURE TRAY) IMPLANT

## 2022-03-08 NOTE — Transfer of Care (Signed)
Immediate Anesthesia Transfer of Care Note  Patient: Casey Lang  Procedure(s) Performed: LESION EXCISION WITH COMPLEX REPAIR > 20cm (Bilateral: Chest)  Patient Location: PACU  Anesthesia Type:General  Level of Consciousness: awake, alert , and oriented  Airway & Oxygen Therapy: Patient Spontanous Breathing and Patient connected to face mask oxygen  Post-op Assessment: Report given to RN and Post -op Vital signs reviewed and stable  Post vital signs: Reviewed and stable  Last Vitals:  Vitals Value Taken Time  BP 113/70 03/08/22 1239  Temp 36.4 C 03/08/22 1239  Pulse 61 03/08/22 1241  Resp 9 03/08/22 1241  SpO2 100 % 03/08/22 1241  Vitals shown include unvalidated device data.  Last Pain:  Vitals:   03/08/22 0857  TempSrc: Oral  PainSc: 0-No pain         Complications: No notable events documented.

## 2022-03-08 NOTE — Op Note (Signed)
Operative Note   DATE OF OPERATION: 1.26.24  LOCATION: [facility]-outpatient  SURGICAL DIVISION: Plastic Surgery  PREOPERATIVE DIAGNOSES:  1. History DCIS 2. Acquired absence breasts  POSTOPERATIVE DIAGNOSES:  same  PROCEDURE:  Complex repair bilateral chest 50 cm  SURGEON: Irene Limbo MD MBA  ASSISTANT: none  ANESTHESIA:  General.   EBL: 25 ml  COMPLICATIONS: None immediate.   INDICATIONS FOR PROCEDURE:  The patient, Casey Lang, is a 42 y.o. female born on May 18, 1980, is here for revision bilateral mastectomy flaps.   FINDINGS: Direct excision medial and lateral extents soft tissue bilateral mastectomy flaps completed. Right lateral chest excision continuous with chronic seroma cavity right chest. Seroma cavity partially excised.  DESCRIPTION OF PROCEDURE:  The patient's operative site was marked with the patient in the preoperative area in standing position. The patient was taken to the operating room. SCDs were placed and IV antibiotics were given. The patient's operative site was prepped and draped in a sterile fashion. A time out was performed and all information was confirmed to be correct. I began over right chest. Sharp excision of marked areas completed. Lateral chest wall excision entered mastectomy chronic seroma cavity that was extended to medial chest but did not communicate with medial right chest soft tissue excision. Seroma cavity partially excised to encourage adherence. 15 Fr JP placed in cavity and secured with 2-0 nylon. Closure completed with 3-0 vicryl running in dermis and 4-0 monocryl subcuticular skin closure, length 25 cm. I then directed attention to left chest. Sharp excision of marked areas completed. Closure completed with 3-0 vicryl running in dermis and 4-0 monocryl subcuticular skin closure, length 25 cm. Dermabond applied followed by dry dressing, breast binder.   The patient was allowed to wake from anesthesia, extubated and taken  to the recovery room in satisfactory condition.   SPECIMENS: right and left mastectomy flaps  DRAINS: 15 Fr JP right subcutaneous chest  Irene Limbo, MD [facility] Plastic & Reconstructive Surgery  Office/ physician access line after hours [phone]

## 2022-03-08 NOTE — Interval H&P Note (Signed)
History and Physical Interval Note:  03/08/2022 10:19 AM  Casey Lang  has presented today for surgery, with the diagnosis of history DCIS, acquired absence breasts.  The various methods of treatment have been discussed with the patient and family. After consideration of risks, benefits and other options for treatment, the patient has consented to  Procedure(s): LESION EXCISION WITH COMPLEX REPAIR > 20cm (Bilateral) as a surgical intervention.  The patient's history has been reviewed, patient examined, no change in status, stable for surgery.  I have reviewed the patient's chart and labs.  Questions were answered to the patient's satisfaction.     Arnoldo Hooker Hayze Gazda

## 2022-03-08 NOTE — Discharge Instructions (Signed)

## 2022-03-08 NOTE — Anesthesia Preprocedure Evaluation (Addendum)
Anesthesia Evaluation  Patient identified by MRN, date of birth, ID band Patient awake    Reviewed: Allergy & Precautions, NPO status , Patient's Chart, lab work & pertinent test results  Airway Mallampati: II  TM Distance: >3 FB Neck ROM: Full    Dental no notable dental hx.    Pulmonary neg pulmonary ROS   Pulmonary exam normal        Cardiovascular hypertension, Normal cardiovascular exam     Neuro/Psych Seizures -,  PSYCHIATRIC DISORDERS Anxiety Depression Bipolar Disorder   PTSD (post-traumatic stress disorder   GI/Hepatic negative GI ROS, Neg liver ROS,,,  Endo/Other  negative endocrine ROS    Renal/GU negative Renal ROS     Musculoskeletal negative musculoskeletal ROS (+)    Abdominal   Peds  Hematology negative hematology ROS (+)   Anesthesia Other Findings history DCIS, acquired absence breasts  Reproductive/Obstetrics S/p BTL                             Anesthesia Physical Anesthesia Plan  ASA: 2  Anesthesia Plan: General   Post-op Pain Management:    Induction: Intravenous  PONV Risk Score and Plan: 3 and Ondansetron, Dexamethasone, Midazolam and Treatment may vary due to age or medical condition  Airway Management Planned: LMA  Additional Equipment:   Intra-op Plan:   Post-operative Plan: Extubation in OR  Informed Consent: I have reviewed the patients History and Physical, chart, labs and discussed the procedure including the risks, benefits and alternatives for the proposed anesthesia with the patient or authorized representative who has indicated his/her understanding and acceptance.     Dental advisory given  Plan Discussed with: CRNA  Anesthesia Plan Comments:        Anesthesia Quick Evaluation

## 2022-03-08 NOTE — Anesthesia Procedure Notes (Signed)
Procedure Name: LMA Insertion Date/Time: 03/08/2022 11:03 AM  Performed by: Bufford Spikes, CRNAPre-anesthesia Checklist: Patient identified, Emergency Drugs available, Suction available and Patient being monitored Patient Re-evaluated:Patient Re-evaluated prior to induction Oxygen Delivery Method: Circle system utilized Preoxygenation: Pre-oxygenation with 100% oxygen Induction Type: IV induction Ventilation: Mask ventilation without difficulty LMA: LMA inserted LMA Size: 4.0 Number of attempts: 1 Placement Confirmation: positive ETCO2 Tube secured with: Tape Dental Injury: Teeth and Oropharynx as per pre-operative assessment

## 2022-03-08 NOTE — Anesthesia Postprocedure Evaluation (Signed)
Anesthesia Post Note  Patient: Casey Lang  Procedure(s) Performed: LESION EXCISION WITH COMPLEX REPAIR > 20cm (Bilateral: Chest)     Patient location during evaluation: PACU Anesthesia Type: General Level of consciousness: awake Pain management: pain level controlled Vital Signs Assessment: post-procedure vital signs reviewed and stable Respiratory status: spontaneous breathing, nonlabored ventilation and respiratory function stable Cardiovascular status: blood pressure returned to baseline and stable Postop Assessment: no apparent nausea or vomiting Anesthetic complications: no   No notable events documented.  Last Vitals:  Vitals:   03/08/22 1315 03/08/22 1345  BP: 100/64 116/72  Pulse: 61 68  Resp: 12 20  Temp:  (!) 36.4 C  SpO2: 94% 100%    Last Pain:  Vitals:   03/08/22 1345  TempSrc: Tympanic  PainSc: 0-No pain                 Zeus Marquis P Brettney Ficken

## 2022-03-11 ENCOUNTER — Encounter (HOSPITAL_BASED_OUTPATIENT_CLINIC_OR_DEPARTMENT_OTHER): Payer: Self-pay | Admitting: Plastic Surgery

## 2022-03-11 ENCOUNTER — Inpatient Hospital Stay: Payer: Medicaid Other | Attending: Hematology and Oncology

## 2022-03-11 ENCOUNTER — Other Ambulatory Visit: Payer: Medicaid Other

## 2022-03-11 DIAGNOSIS — D0512 Intraductal carcinoma in situ of left breast: Secondary | ICD-10-CM | POA: Diagnosis not present

## 2022-03-11 DIAGNOSIS — D709 Neutropenia, unspecified: Secondary | ICD-10-CM | POA: Insufficient documentation

## 2022-03-11 DIAGNOSIS — Z9013 Acquired absence of bilateral breasts and nipples: Secondary | ICD-10-CM | POA: Diagnosis not present

## 2022-03-11 LAB — CBC WITH DIFFERENTIAL (CANCER CENTER ONLY)
Abs Immature Granulocytes: 0 10*3/uL (ref 0.00–0.07)
Basophils Absolute: 0 10*3/uL (ref 0.0–0.1)
Basophils Relative: 1 %
Eosinophils Absolute: 0.1 10*3/uL (ref 0.0–0.5)
Eosinophils Relative: 3 %
HCT: 36.4 % (ref 36.0–46.0)
Hemoglobin: 11.6 g/dL — ABNORMAL LOW (ref 12.0–15.0)
Immature Granulocytes: 0 %
Lymphocytes Relative: 38 %
Lymphs Abs: 1.2 10*3/uL (ref 0.7–4.0)
MCH: 30.2 pg (ref 26.0–34.0)
MCHC: 31.9 g/dL (ref 30.0–36.0)
MCV: 94.8 fL (ref 80.0–100.0)
Monocytes Absolute: 0.3 10*3/uL (ref 0.1–1.0)
Monocytes Relative: 9 %
Neutro Abs: 1.5 10*3/uL — ABNORMAL LOW (ref 1.7–7.7)
Neutrophils Relative %: 49 %
Platelet Count: 220 10*3/uL (ref 150–400)
RBC: 3.84 MIL/uL — ABNORMAL LOW (ref 3.87–5.11)
RDW: 13.8 % (ref 11.5–15.5)
WBC Count: 3 10*3/uL — ABNORMAL LOW (ref 4.0–10.5)
nRBC: 0 % (ref 0.0–0.2)

## 2022-03-11 LAB — FOLATE: Folate: 7.7 ng/mL (ref >5.9)

## 2022-03-12 LAB — ANTINUCLEAR ANTIBODIES, IFA: ANA Ab, IFA: NEGATIVE

## 2022-03-12 LAB — SURGICAL PATHOLOGY

## 2022-03-15 ENCOUNTER — Inpatient Hospital Stay: Payer: Medicaid Other | Attending: Hematology and Oncology | Admitting: Hematology and Oncology

## 2022-03-15 DIAGNOSIS — D0512 Intraductal carcinoma in situ of left breast: Secondary | ICD-10-CM | POA: Diagnosis not present

## 2022-03-15 DIAGNOSIS — D709 Neutropenia, unspecified: Secondary | ICD-10-CM

## 2022-03-15 NOTE — Progress Notes (Signed)
HEMATOLOGY-ONCOLOGY TELEPHONE VISIT PROGRESS NOTE  I connected with our patient on 03/15/22 at 11:00 AM EST by telephone and verified that I am speaking with the correct person using two identifiers.  I discussed the limitations, risks, security and privacy concerns of performing an evaluation and management service by telephone and the availability of in person appointments.  I also discussed with the patient that there may be a patient responsible charge related to this service. The patient expressed understanding and agreed to proceed.   History of Present Illness:   Oncology History  Ductal carcinoma in situ (DCIS) of left breast  11/02/2021 Initial Diagnosis   Screening mammogram detected left breast calcifications measuring 18.6 cm in span.  2 biopsies were performed both anterior and posterior revealed high-grade DCIS with solid cribriform and micropapillary features with necrosis ER 95%, PR 80% to 50%   11/14/2021 Cancer Staging   Staging form: Breast, AJCC 8th Edition - Clinical: Stage 0 (cTis (DCIS), cN0, cM0, G3, ER+, PR+, HER2: Not Assessed) - Signed by Nicholas Lose, MD on 11/14/2021 Histologic grading system: 3 grade system    Genetic Testing   [facility] CustomNext Panel+RNA was Negative. Report date is 11/22/2021.  The CustomNext-Cancer+RNAinsight panel offered by [facility] includes sequencing and rearrangement analysis for the following 47 genes:  APC, ATM, AXIN2, BARD1, BMPR1A, BRCA1, BRCA2, BRIP1, CDH1, CDK4, CDKN2A, CHEK2, CTNNA1, DICER1, EPCAM, GREM1, HOXB13, KIT, MEN1, MLH1, MSH2, MSH3, MSH6, MUTYH, NBN, NF1, NTHL1, PALB2, PDGFRA, PMS2, POLD1, POLE, PTEN, RAD50, RAD51C, RAD51D, SDHA, SDHB, SDHC, SDHD, SMAD4, SMARCA4, STK11, TP53, TSC1, TSC2, and VHL.  RNA data is routinely analyzed for use in variant interpretation for all genes.   11/29/2021 Surgery   Bilateral mastectomies Right mastectomy: Benign Left mastectomy: High-grade DCIS with solid and papillary types, 0/7  lymph nodes negative ER 95%, PR 80% strong staining     REVIEW OF SYSTEMS:   Constitutional: Denies fevers, chills or abnormal weight loss All other systems were reviewed with the patient and are negative. Observations/Objective:     Assessment Plan:  Ductal carcinoma in situ (DCIS) of left breast 11/02/2021:Screening mammogram detected left breast calcifications measuring 18.6 cm in span.  2 biopsies were performed both anterior and posterior revealed high-grade DCIS with solid cribriform and micropapillary features with necrosis ER 95%, PR 80% to 50%   11/29/2021: Bilateral mastectomies Right mastectomy: Benign Left mastectomy: High-grade DCIS with solid and papillary types, 0/7 lymph nodes negative ER 95%, PR 80% strong staining Plastic Surgery: Thimappa (last surgery 03/08/22)  Leukopenia Leukopenia: In 2020 her white count was normal and in 2023 white count has declined. 11/14/2021: WBC 2.6, hemoglobin 11.5, ANC 1.2 03/11/2022: WBC 3.0, hemoglobin 11.6, ANC 1.5, ANA negative, folate 7.7  I discussed with the patient that has not been any deterioration of the white count in fact it appears to be stable. Differential diagnosis: Ethnicity related versus Seroquel  Return to clinic in 6 months with labs    I discussed the assessment and treatment plan with the patient. The patient was provided an opportunity to ask questions and all were answered. The patient agreed with the plan and demonstrated an understanding of the instructions. The patient was advised to call back or seek an in-person evaluation if the symptoms worsen or if the condition fails to improve as anticipated.   I provided 12 minutes of non-face-to-face time during this encounter.  This includes time for charting and coordination of care   Casey Ohara, MD

## 2022-03-15 NOTE — Assessment & Plan Note (Signed)
11/02/2021:Screening mammogram detected left breast calcifications measuring 18.6 cm in span.  2 biopsies were performed both anterior and posterior revealed high-grade DCIS with solid cribriform and micropapillary features with necrosis ER 95%, PR 80% to 50%   11/29/2021: Bilateral mastectomies Right mastectomy: Benign Left mastectomy: High-grade DCIS with solid and papillary types, 0/7 lymph nodes negative ER 95%, PR 80% strong staining

## 2022-03-15 NOTE — Assessment & Plan Note (Signed)
Leukopenia: In 2020 her white count was normal and in 2023 white count has declined. 11/14/2021: WBC 2.6, hemoglobin 11.5, ANC 1.2 03/11/2022: WBC 3.0, hemoglobin 11.6, ANC 1.5, ANA negative, folate 7.7  I discussed with the patient that has not been any deterioration of the white count in fact it appears to be stable. Differential diagnosis: Ethnicity related versus Seroquel  Return to clinic in 6 months with labs

## 2022-03-18 ENCOUNTER — Telehealth: Payer: Self-pay | Admitting: Hematology and Oncology

## 2022-03-18 NOTE — Telephone Encounter (Signed)
Scheduled appointment per 2/2 los. Left voicemail.

## 2022-06-03 ENCOUNTER — Ambulatory Visit: Payer: Medicaid Other | Attending: General Surgery

## 2022-06-03 VITALS — Wt 224.0 lb

## 2022-06-03 DIAGNOSIS — Z483 Aftercare following surgery for neoplasm: Secondary | ICD-10-CM | POA: Insufficient documentation

## 2022-06-03 NOTE — Therapy (Signed)
OUTPATIENT PHYSICAL THERAPY SOZO SCREENING NOTE   Patient Name: Casey Lang MRN: [id number] DOB:09-29-1980, 42 y.o., female Today's Date: 06/03/2022  PCP: Dulce Sellar, NP REFERRING PROVIDER: Griselda Miner, MD   PT End of Session - 06/03/22 1654     Visit Number 2   # unchanged due to screen only   PT Start Time 1653    PT Stop Time 1657    PT Time Calculation (min) 4 min    Activity Tolerance Patient tolerated treatment well    Behavior During Therapy [facility] for tasks assessed/performed             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   Depression    Leukopenia 10/19/2021   Mental disorder    PTSD (post-traumatic stress disorder)    Right foot injury 10/09/2012   Resolved without complication   Past Surgical History:  Procedure Laterality Date   FOOT SURGERY     HEMATOMA EVACUATION N/A 07/18/2016   Procedure: EVACUATION HEMATOMA;  Surgeon: Reva Bores, MD;  Location: [facility]IRTH[facility] SU[facility];  Service: Gynecology;  Laterality: N/A;   LAPAROSCOPIC GASTRIC SLEEVE RESECTION  10/09/2012   Surgery was on 09/30/2011   LESION EXCISION WITH COMPLEX REPAIR Bilateral 03/08/2022   Procedure: LESION EXCISION WITH COMPLEX REPAIR > 20cm;  Surgeon: Glenna Fellows, MD;  Location: [facility];  Service: Plastics;  Laterality: Bilateral;   MASTECTOMY W/ SENTINEL NODE BIOPSY Left 11/29/2021   Procedure: LEFT MASTECTOMY WITH SENTINEL LYMPH NODE BIOPSY;  Surgeon: Griselda Miner, MD;  Location: [facility];  Service: General;  Laterality: Left;   TOTAL MASTECTOMY Right 11/29/2021   Procedure: RIGHT TOTAL MASTECTOMY;  Surgeon: Griselda Miner, MD;  Location: [facility];  Service: General;  Laterality: Right;   TUBAL LIGATION Bilateral 07/18/2016   Procedure: POST PARTUM TUBAL LIGATION;  Surgeon: Reva Bores,  MD;  Location: [facility]IRTHING SUIT[facility];  Service: Gynecology;  Laterality: Bilateral;   Patient Active Problem List   Diagnosis Date Noted   Genetic testing 11/26/2021   Family history of breast cancer 11/14/2021   Ductal carcinoma in situ (DCIS) of left breast 11/12/2021   Leukopenia 10/19/2021   Iron deficiency anemia due to chronic blood loss 06/11/2021   Gestational hypertension 07/17/2016   Gestational hypertension, third trimester 05/03/2016   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 It measures 18.6 cm and is located in the upper outer quadrant. It is ER/PR positive. Patient elected for bilateral mastectomies, completed 11/29/2021. Final pathology right breast benign, left breast two areas high grade DCIS, margins clear, 0/7 SLN. Drains removed 12/24/2021. 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   PAIN:  Are you having pain? No  SOZO SCREENING: Patient was assessed today using the SOZO machine to determine the lymphedema index score. This was compared to her baseline score. It was determined that she is within the recommended range when compared to her baseline and no further action is needed at this time. She will continue SOZO screenings. These are done every 3 months for 2  years post operatively followed by every 6 months for 2 years, and then annually.  Issued pt info, per her request, on how to order a compression sleeve from [email or web address] and to measure [employer]. Pt knows to call back if she has any further questions regarding this.    L-DEX FLOWSHEETS - 06/03/22 1600       L-DEX LYMPHEDEMA SCREENING   Measurement Type Unilateral    L-DEX MEASUREMENT EXTREMITY Upper Extremity    POSITION  Standing    DOMINANT SIDE Right    At Risk Side Left    BASELINE SCORE (UNILATERAL) 3.5    L-DEX SCORE (UNILATERAL) -1.4    VALUE CHANGE (UNILAT) -4.9              Hermenia Bers, PTA 06/03/2022, 4:55 PM

## 2022-08-26 ENCOUNTER — Encounter: Payer: Self-pay | Admitting: Hematology and Oncology

## 2022-09-02 ENCOUNTER — Encounter: Payer: Self-pay | Admitting: Rehabilitation

## 2022-09-02 ENCOUNTER — Ambulatory Visit: Payer: MEDICAID | Attending: General Surgery | Admitting: Rehabilitation

## 2022-09-02 DIAGNOSIS — C50412 Malignant neoplasm of upper-outer quadrant of left female breast: Secondary | ICD-10-CM | POA: Insufficient documentation

## 2022-09-02 DIAGNOSIS — Z170 Estrogen receptor positive status [ER+]: Secondary | ICD-10-CM | POA: Insufficient documentation

## 2022-09-02 DIAGNOSIS — Z483 Aftercare following surgery for neoplasm: Secondary | ICD-10-CM | POA: Insufficient documentation

## 2022-09-02 NOTE — Therapy (Signed)
OUTPATIENT PHYSICAL THERAPY SOZO SCREENING NOTE   Patient Name: Casey Lang MRN: [id number] DOB:31-Mar-1980, 42 y.o., female Today's Date: 09/02/2022  PCP: Dulce Sellar, NP REFERRING PROVIDER: Griselda Miner, MD   PT End of Session - 09/02/22 1637     Visit Number 2   screen   PT Start Time 1635    PT Stop Time 1638    PT Time Calculation (min) 3 min    Activity Tolerance Patient tolerated treatment well    Behavior During Therapy [facility] for tasks assessed/performed             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   Depression    Leukopenia 10/19/2021   Mental disorder    PTSD (post-traumatic stress disorder)    Right foot injury 10/09/2012   Resolved without complication   Past Surgical History:  Procedure Laterality Date   FOOT SURGERY     HEMATOMA EVACUATION N/A 07/18/2016   Procedure: EVACUATION HEMATOMA;  Surgeon: Reva Bores, MD;  Location: [facility] BIRTHING SUITES;  Service: Gynecology;  Laterality: N/A;   LAPAROSCOPIC GASTRIC SLEEVE RESECTION  10/09/2012   Surgery was on 09/30/2011   LESION EXCISION WITH COMPLEX REPAIR Bilateral 03/08/2022   Procedure: LESION EXCISION WITH COMPLEX REPAIR > 20cm;  Surgeon: Glenna Fellows, MD;  Location: [facility];  Service: Plastics;  Laterality: Bilateral;   MASTECTOMY W/ SENTINEL NODE BIOPSY Left 11/29/2021   Procedure: LEFT MASTECTOMY WITH SENTINEL LYMPH NODE BIOPSY;  Surgeon: Griselda Miner, MD;  Location: [facility];  Service: General;  Laterality: Left;   TOTAL MASTECTOMY Right 11/29/2021   Procedure: RIGHT TOTAL MASTECTOMY;  Surgeon: Griselda Miner, MD;  Location: [facility];  Service: General;  Laterality: Right;   TUBAL LIGATION Bilateral 07/18/2016   Procedure: POST PARTUM TUBAL LIGATION;  Surgeon: Reva Bores, MD;  Location: [facility]IRTH[facility]  SUIT[facility];  Service: Gynecology;  Laterality: Bilateral;   Patient Active Problem List   Diagnosis Date Noted   Genetic testing 11/26/2021   Family history of breast cancer 11/14/2021   Ductal carcinoma in situ (DCIS) of left breast 11/12/2021   Leukopenia 10/19/2021   Iron deficiency anemia due to chronic blood loss 06/11/2021   Gestational hypertension 07/17/2016   Gestational hypertension, third trimester 05/03/2016   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 It measures 18.6 cm and is located in the upper outer quadrant. It is ER/PR positive. Patient elected for bilateral mastectomies, completed 11/29/2021. Final pathology right breast benign, left breast two areas high grade DCIS, margins clear, 0/7 SLN. Drains removed 12/24/2021. 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   PAIN:  Are you having pain? No  SOZO SCREENING: Patient was assessed today using the SOZO machine to determine the lymphedema index score. This was compared to her baseline score. It was determined that she is within the recommended range when compared to her baseline and no further action  is needed at this time. She will continue SOZO screenings. These are done every 3 months for 2 years post operatively followed by every 6 months for 2 years, and then annually.    L-DEX FLOWSHEETS - 09/02/22 1600       L-DEX LYMPHEDEMA SCREENING   Measurement Type Unilateral    L-DEX MEASUREMENT EXTREMITY Upper Extremity    POSITION  Standing    DOMINANT SIDE Right    At Risk Side Left    BASELINE SCORE (UNILATERAL) 3.5    L-DEX SCORE (UNILATERAL) -2.5    VALUE CHANGE (UNILAT) -6              Sherrod Toothman, Julieanne Manson, PT 09/02/2022, 4:37 PM

## 2022-09-13 ENCOUNTER — Inpatient Hospital Stay: Payer: MEDICAID | Attending: Hematology and Oncology

## 2022-09-13 DIAGNOSIS — H5461 Unqualified visual loss, right eye, normal vision left eye: Secondary | ICD-10-CM | POA: Diagnosis not present

## 2022-09-13 DIAGNOSIS — D72819 Decreased white blood cell count, unspecified: Secondary | ICD-10-CM | POA: Diagnosis not present

## 2022-09-13 DIAGNOSIS — Z9013 Acquired absence of bilateral breasts and nipples: Secondary | ICD-10-CM | POA: Diagnosis not present

## 2022-09-13 DIAGNOSIS — D0512 Intraductal carcinoma in situ of left breast: Secondary | ICD-10-CM | POA: Diagnosis present

## 2022-09-13 DIAGNOSIS — D709 Neutropenia, unspecified: Secondary | ICD-10-CM

## 2022-09-13 LAB — CBC WITH DIFFERENTIAL (CANCER CENTER ONLY)
Abs Immature Granulocytes: 0 10*3/uL (ref 0.00–0.07)
Basophils Absolute: 0 10*3/uL (ref 0.0–0.1)
Basophils Relative: 1 %
Eosinophils Absolute: 0 10*3/uL (ref 0.0–0.5)
Eosinophils Relative: 1 %
HCT: 37.8 % (ref 36.0–46.0)
Hemoglobin: 12.1 g/dL (ref 12.0–15.0)
Immature Granulocytes: 0 %
Lymphocytes Relative: 49 %
Lymphs Abs: 1.4 10*3/uL (ref 0.7–4.0)
MCH: 30.6 pg (ref 26.0–34.0)
MCHC: 32 g/dL (ref 30.0–36.0)
MCV: 95.5 fL (ref 80.0–100.0)
Monocytes Absolute: 0.3 10*3/uL (ref 0.1–1.0)
Monocytes Relative: 9 %
Neutro Abs: 1.1 10*3/uL — ABNORMAL LOW (ref 1.7–7.7)
Neutrophils Relative %: 40 %
Platelet Count: 205 10*3/uL (ref 150–400)
RBC: 3.96 MIL/uL (ref 3.87–5.11)
RDW: 13.5 % (ref 11.5–15.5)
WBC Count: 2.7 10*3/uL — ABNORMAL LOW (ref 4.0–10.5)
nRBC: 0 % (ref 0.0–0.2)

## 2022-09-16 ENCOUNTER — Other Ambulatory Visit: Payer: Medicaid Other

## 2022-09-16 ENCOUNTER — Ambulatory Visit: Payer: Medicaid Other | Admitting: Hematology and Oncology

## 2022-09-16 NOTE — Progress Notes (Signed)
HEMATOLOGY-ONCOLOGY TELEPHONE VISIT PROGRESS NOTE  I connected with our patient on 09/18/22 at  8:30 AM EDT by telephone and verified that I am speaking with the correct person using two identifiers.  I discussed the limitations, risks, security and privacy concerns of performing an evaluation and management service by telephone and the availability of in person appointments.  I also discussed with the patient that there may be a patient responsible charge related to this service. The patient expressed understanding and agreed to proceed.   History of Present Illness: Casey Lang is a 42 y.o. female is here because of recent diagnosis of left breast DCIS. She presents to the clinic for a telephone follow-up. 1 month ago sore throat and was treated with antibiotics at urgent care. Right eye vision loss intermittently.   Oncology History  Ductal carcinoma in situ (DCIS) of left breast  11/02/2021 Initial Diagnosis   Screening mammogram detected left breast calcifications measuring 18.6 cm in span.  2 biopsies were performed both anterior and posterior revealed high-grade DCIS with solid cribriform and micropapillary features with necrosis ER 95%, PR 80% to 50%   11/14/2021 Cancer Staging   Staging form: Breast, AJCC 8th Edition - Clinical: Stage 0 (cTis (DCIS), cN0, cM0, G3, ER+, PR+, HER2: Not Assessed) - Signed by Serena Croissant, MD on 11/14/2021 Histologic grading system: 3 grade system    Genetic Testing   [facility] CustomNext Panel+RNA was Negative. Report date is 11/22/2021.  The CustomNext-Cancer+RNAinsight panel offered by [employer] includes sequencing and rearrangement analysis for the following 47 genes:  APC, ATM, AXIN2, BARD1, BMPR1A, BRCA1, BRCA2, BRIP1, CDH1, CDK4, CDKN2A, CHEK2, CTNNA1, DICER1, EPCAM, GREM1, HOXB13, KIT, MEN1, MLH1, MSH2, MSH3, MSH6, MUTYH, NBN, NF1, NTHL1, PALB2, PDGFRA, PMS2, POLD1, POLE, PTEN, RAD50, RAD51C, RAD51D, SDHA, SDHB, SDHC, SDHD, SMAD4, SMARCA4,  STK11, TP53, TSC1, TSC2, and VHL.  RNA data is routinely analyzed for use in variant interpretation for all genes.   11/29/2021 Surgery   Bilateral mastectomies Right mastectomy: Benign Left mastectomy: High-grade DCIS with solid and papillary types, 0/7 lymph nodes negative ER 95%, PR 80% strong staining     REVIEW OF SYSTEMS:   Constitutional: Denies fevers, chills or abnormal weight loss All other systems were reviewed with the patient and are negative. Observations/Objective:     Assessment Plan:  Ductal carcinoma in situ (DCIS) of left breast 11/02/2021:Screening mammogram detected left breast calcifications measuring 18.6 cm in span.  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 (GF had CLL) 11/14/2021: WBC 2.6, hemoglobin 11.5, ANC 1.2 03/11/2022: WBC 3.0, hemoglobin 11.6, ANC 1.5, ANA negative, folate 7.7 09/13/2022: WBC 2.7, hemoglobin 12.1, platelets 205, ANC 1.1   I discussed with the patient that the white blood cell count has been fluctuating up and down.  Overall she is not running frequent infections and therefore this can be watched and monitored.  Differential diagnosis: Ethnicity related versus Seroquel Because patient is very concerned about underlying bone marrow disorder, I recommend that we obtain a bone marrow biopsy to verify that. Return to clinic after the bone marrow to discuss the results.  I  discussed the assessment and treatment plan with the patient. The patient was provided an opportunity to ask questions and all  were answered. The patient agreed with the plan and demonstrated an understanding of the instructions. The patient was advised to call back or seek an in-person evaluation if the symptoms worsen or if the condition fails to improve as anticipated.   I provided 12 minutes of non-face-to-face time during this encounter.  This includes time for charting and coordination of care   Tamsen Meek, MD  I Janan Ridge am acting as a scribe for Dr.Vinay Gudena  I have reviewed the above documentation for accuracy and completeness, and I agree with the above.

## 2022-09-18 ENCOUNTER — Inpatient Hospital Stay (HOSPITAL_BASED_OUTPATIENT_CLINIC_OR_DEPARTMENT_OTHER): Payer: MEDICAID | Admitting: Hematology and Oncology

## 2022-09-18 DIAGNOSIS — D0512 Intraductal carcinoma in situ of left breast: Secondary | ICD-10-CM | POA: Diagnosis not present

## 2022-09-18 DIAGNOSIS — D709 Neutropenia, unspecified: Secondary | ICD-10-CM | POA: Diagnosis not present

## 2022-09-18 NOTE — Assessment & Plan Note (Signed)
11/02/2021:Screening mammogram detected left breast calcifications measuring 18.6 cm in span.  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 11/14/2021: WBC 2.6, hemoglobin 11.5, ANC 1.2 03/11/2022: WBC 3.0, hemoglobin 11.6, ANC 1.5, ANA negative, folate 7.7 09/13/2022: WBC 2.7, hemoglobin 12.1, platelets 205, ANC 1.1   I discussed with the patient that the white blood cell count has been fluctuating up and down.  Overall she is not running frequent infections and therefore this can be watched and monitored. Differential diagnosis: Ethnicity related versus Seroquel   Return to clinic in 6 months with labs

## 2022-09-25 ENCOUNTER — Other Ambulatory Visit: Payer: Self-pay

## 2022-09-25 ENCOUNTER — Telehealth: Payer: Self-pay

## 2022-09-25 DIAGNOSIS — D0512 Intraductal carcinoma in situ of left breast: Secondary | ICD-10-CM

## 2022-09-25 NOTE — Telephone Encounter (Signed)
Pt called and reported (L) hip pain. She states she forgot to mention this to MD during phone visit. She reports having to often change positions d./t discomfort and describes the pain as "deep, dull pain" She states she works out on an elliptical machine 5 days per week.   Per MD order placed for (L) hip Xray to further eval indication for pain.

## 2022-09-28 ENCOUNTER — Other Ambulatory Visit: Payer: Self-pay | Admitting: Hematology and Oncology

## 2022-09-28 ENCOUNTER — Ambulatory Visit (HOSPITAL_COMMUNITY)
Admission: RE | Admit: 2022-09-28 | Discharge: 2022-09-28 | Disposition: A | Discharge status: Home / Self Care | Payer: MEDICAID | Source: Ambulatory Visit | Attending: Hematology and Oncology | Admitting: Hematology and Oncology

## 2022-09-28 ENCOUNTER — Encounter: Payer: Self-pay | Admitting: Hematology and Oncology

## 2022-09-28 DIAGNOSIS — M25552 Pain in left hip: Secondary | ICD-10-CM

## 2022-10-09 ENCOUNTER — Other Ambulatory Visit: Payer: Self-pay | Admitting: Physician Assistant

## 2022-10-09 DIAGNOSIS — Z01818 Encounter for other preprocedural examination: Secondary | ICD-10-CM

## 2022-10-09 NOTE — H&P (Signed)
Chief Complaint: Patient was seen in consultation today for bone marrow biopsy with aspiration  Referring Physician(s): Serena Croissant  Supervising Physician: {Supervising Physician:[id number]}  Patient Status: [facility] - Out-pt  History of Present Illness: Casey Lang is a 42 y.o. female with a medical history significant for anxiety/depression, PTSD, Bipolar 1 disorder, conversion disorder with attacks/seizures, and left breast cancer s/p bilateral mastectomies in 2023. She also has a history of leukopenia with WBC that fluctuates up and down. Her hematology team is uncertain if this is possibly due to her ethnicity versus seroquel use.   Interventional Radiology has been asked to evaluate this patient for an image-guided bone marrow biopsy with aspiration to assess for possible underlying bone marrow disorder.   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   Depression    Leukopenia 10/19/2021   Mental disorder    PTSD (post-traumatic stress disorder)    Right foot injury 10/09/2012   Resolved without complication    Past Surgical History:  Procedure Laterality Date   FOOT SURGERY     HEMATOMA EVACUATION N/A 07/18/2016   Procedure: EVACUATION HEMATOMA;  Surgeon: Reva Bores, MD;  Location: [facility] BIRTHING SUITES;  Service: Gynecology;  Laterality: N/A;   LAPAROSCOPIC GASTRIC SLEEVE RESECTION  10/09/2012   Surgery was on 09/30/2011   LESION EXCISION WITH COMPLEX REPAIR Bilateral 03/08/2022   Procedure: LESION EXCISION WITH COMPLEX REPAIR > 20cm;  Surgeon: Glenna Fellows, MD;  Location: [facility];  Service: Plastics;  Laterality: Bilateral;   MASTECTOMY W/ SENTINEL NODE BIOPSY Left 11/29/2021   Procedure: LEFT MASTECTOMY WITH SENTINEL LYMPH NODE BIOPSY;  Surgeon: Griselda Miner, MD;  Location: [facility];  Service: General;   Laterality: Left;   TOTAL MASTECTOMY Right 11/29/2021   Procedure: RIGHT TOTAL MASTECTOMY;  Surgeon: Griselda Miner, MD;  Location: [facility];  Service: General;  Laterality: Right;   TUBAL LIGATION Bilateral 07/18/2016   Procedure: POST PARTUM TUBAL LIGATION;  Surgeon: Reva Bores, MD;  Location: [facility] BIRTHING SUIT[facility];  Service: Gynecology;  Laterality: Bilateral;    Allergies: Morphine and codeine  Medications: Prior to Admission medications   Medication Sig Start Date End Date Taking? Authorizing Provider  Biotin (YUMVS BIOTIN MAX POTENCY) 5000 MCG CHEW Chew by mouth.    [provider]  Calcium Carb-Cholecalciferol (OYSTER SHELL CALCIUM W/D) 500-5 MG-MCG TABS Take by mouth.    [provider]  clonazePAM (KLONOPIN) 0.5 MG tablet Take 0.5 mg by mouth 2 (two) times daily as needed for anxiety.    [provider]  ferrous sulfate 325 (65 FE) MG tablet Take 325 mg by mouth daily with breakfast.    [provider]  gabapentin (NEURONTIN) 100 MG capsule Take 100 mg by mouth 3 (three) times daily.    [provider]  Lactobacillus Rhamnosus, GG, (CULTURELLE) CAPS Take 1 capsule by mouth daily.    [provider]  methocarbamol (ROBAXIN) 500 MG tablet Take 1 tablet (500 mg total) by mouth every 6 (six) hours as needed for muscle spasms. 11/30/21   Griselda Miner, MD  Multiple Vitamin (MULTI-VITAMIN) tablet Take 1 tablet by mouth daily.    [provider]  Omega-3 1000 MG CAPS Take by mouth.    [provider]  oxyCODONE-acetaminophen (PERCOCET) 5-325 MG tablet Take 1  tablet by mouth every 4 (four) hours as needed for severe pain. 03/08/22 03/08/23  Glenna Fellows, MD  QUEtiapine (SEROQUEL) 25 MG tablet Take 25 mg by mouth. Three tabs daily /total of 75 mg per patient    [provider]  Turmeric 400 MG CAPS Take by mouth.    [provider]     Family History  Problem Relation Age of Onset    Hypertension Mother    Diabetes Mother    Hyperlipidemia Mother    Diabetes Father    Hypertension Father    Asthma Brother    Colon cancer Paternal Uncle 41 - 23   Hypertension Maternal Grandmother    Stroke Maternal Grandmother    Aneurysm Maternal Grandmother    Arthritis Maternal Grandmother    Heart attack Maternal Grandmother    Hypercholesterolemia Maternal Grandmother    Colon cancer Maternal Grandfather 52 - 69   Alzheimer's disease Paternal Grandmother    Breast cancer Paternal Grandmother 35 - 20   Anxiety disorder Other    Depression Other    Seizures Neg Hx     Social History   Socioeconomic History   Marital status: Single    Spouse name: Not on file   Number of children: 1   Years of education: 4 years college   Highest education level: Bachelor's degree (e.g., BA, AB, BS)  Occupational History   Occupation: nurse  Tobacco Use   Smoking status: Never   Smokeless tobacco: Never  Vaping Use   Vaping status: Never Used  Substance and Sexual Activity   Alcohol use: No   Drug use: No   Sexual activity: Not Currently    Birth control/protection: Surgical  Other Topics Concern   Not on file  Social History Narrative   Not on file   Social Determinants of Health   Financial Resource Strain: Low Risk  (08/22/2022)   Received [facility]   Overall Financial Resource Strain (CARDIA)    Difficulty of Paying Living Expenses: Not hard at all  Food Insecurity: No Food Insecurity (08/22/2022)   [facility]   Hunger Vital Sign    Worried About Running Out of Food in the Last Year: Never true    Ran Out of Food in the Last Year: Never true  Transportation Needs: No Transportation Needs (08/22/2022)   Received from [facility]   PRAPARE - Transportation    Lack of Transportation (Medical): No    Lack of Transportation (Non-Medical): No  Physical Activity: Not on file  Stress: Not on file  Social Connections: Not on file     Review of Systems: A 12 point ROS discussed and pertinent positives are indicated in the HPI above.  All other systems are negative.  Review of Systems  Vital Signs: LMP 09/21/2022   Physical Exam  Imaging: DG HIP UNILAT WITH PELVIS 2-3 VIEWS LEFT  Result Date: 09/28/2022 CLINICAL DATA:  Left hip pain. EXAM: DG HIP (WITH OR WITHOUT PELVIS) 2-3V LEFT COMPARISON:  None Available. FINDINGS: There is no evidence of hip fracture or dislocation. Joint space at the hips is within normal limits. Degenerative changes are present in the lower lumbar spine. Tubal ligation clips are present in the pelvis. IMPRESSION: 1. Normal examination of the hips. 2. Degenerative changes in the lower lumbar spine. Electronically Signed   By: Thornell Sartorius M.D.   On: 09/28/2022 20:35    Labs:  CBC: Recent Labs    10/23/21  0000 11/14/21 0806 03/11/22 0854 09/13/22 0933  WBC 2.6 2.6* 3.0* 2.7*  HGB 12.6 11.5* 11.6* 12.1  HCT 38 34.6* 36.4 37.8  PLT 216 234 220 205    COAGS: No results for input(s): "INR", "APTT" in the last 8760 hours.  BMP: Recent Labs    11/14/21 0806  NA 141  K 3.9  CL 106  CO2 32  GLUCOSE 74  BUN 15  CALCIUM 9.1  CREATININE 1.03*  GFRNONAA >60    LIVER FUNCTION TESTS: Recent Labs    11/14/21 0806  BILITOT 0.9  AST 11*  ALT 8  ALKPHOS 43  PROT 6.4*  ALBUMIN 4.1    TUMOR MARKERS: No results for input(s): "AFPTM", "CEA", "CA199", "CHROMGRNA" in the last 8760 hours.  Assessment and Plan:  Leukopenia: Casey Lang, 42 year old female, presents today to the [facility] Interventional Radiology department for an image-guided bone marrow biopsy with aspiration.   Risks and benefits of this procedure were discussed with the patient and/or patient's family including, but not limited to bleeding, infection, damage to adjacent structures or low yield requiring additional tests.  All of the questions were answered and there is agreement to proceed. She has  been NPO. She is a full code.   Consent signed and in chart.  Thank you for this interesting consult.  I greatly enjoyed meeting Casey Lang and look forward to participating in their care.  A copy of this report was sent to the requesting provider on this date.  Electronically Signed: Alwyn Ren, AGACNP-BC [id number] 10/09/2022, 10:26 AM   I spent a total of  30 Minutes   in face to face in clinical consultation, greater than 50% of which was counseling/coordinating care for bone marrow biopsy with aspiration.

## 2022-10-10 ENCOUNTER — Encounter (HOSPITAL_COMMUNITY): Payer: Self-pay

## 2022-10-10 ENCOUNTER — Other Ambulatory Visit: Payer: Self-pay

## 2022-10-10 ENCOUNTER — Ambulatory Visit (HOSPITAL_COMMUNITY)
Admission: RE | Admit: 2022-10-10 | Discharge: 2022-10-10 | Disposition: A | Discharge status: Home / Self Care | Payer: BC Managed Care – PPO | Source: Ambulatory Visit | Attending: Hematology and Oncology | Admitting: Hematology and Oncology

## 2022-10-10 ENCOUNTER — Encounter: Payer: Self-pay | Admitting: Hematology and Oncology

## 2022-10-10 DIAGNOSIS — D709 Neutropenia, unspecified: Secondary | ICD-10-CM | POA: Diagnosis present

## 2022-10-10 DIAGNOSIS — Z01818 Encounter for other preprocedural examination: Secondary | ICD-10-CM

## 2022-10-10 DIAGNOSIS — Z853 Personal history of malignant neoplasm of breast: Secondary | ICD-10-CM | POA: Insufficient documentation

## 2022-10-10 DIAGNOSIS — D72819 Decreased white blood cell count, unspecified: Secondary | ICD-10-CM | POA: Diagnosis present

## 2022-10-10 DIAGNOSIS — Z79899 Other long term (current) drug therapy: Secondary | ICD-10-CM | POA: Insufficient documentation

## 2022-10-10 LAB — CBC WITH DIFFERENTIAL/PLATELET
Abs Immature Granulocytes: 0 10*3/uL (ref 0.00–0.07)
Basophils Absolute: 0 10*3/uL (ref 0.0–0.1)
Basophils Relative: 0 %
Eosinophils Absolute: 0 10*3/uL (ref 0.0–0.5)
Eosinophils Relative: 2 %
HCT: 38.2 % (ref 36.0–46.0)
Hemoglobin: 12 g/dL (ref 12.0–15.0)
Immature Granulocytes: 0 %
Lymphocytes Relative: 49 %
Lymphs Abs: 1.1 10*3/uL (ref 0.7–4.0)
MCH: 30 pg (ref 26.0–34.0)
MCHC: 31.4 g/dL (ref 30.0–36.0)
MCV: 95.5 fL (ref 80.0–100.0)
Monocytes Absolute: 0.2 10*3/uL (ref 0.1–1.0)
Monocytes Relative: 9 %
Neutro Abs: 0.9 10*3/uL — ABNORMAL LOW (ref 1.7–7.7)
Neutrophils Relative %: 40 %
Platelets: UNDETERMINED 10*3/uL (ref 150–400)
RBC: 4 MIL/uL (ref 3.87–5.11)
RDW: 13.3 % (ref 11.5–15.5)
WBC: 2.3 10*3/uL — ABNORMAL LOW (ref 4.0–10.5)
nRBC: 0 % (ref 0.0–0.2)

## 2022-10-10 MED ORDER — DIPHENHYDRAMINE HCL 50 MG/ML IJ SOLN
INTRAMUSCULAR | Status: AC
  Filled 2022-10-10: qty 1

## 2022-10-10 MED ORDER — FENTANYL CITRATE (PF) 100 MCG/2ML IJ SOLN
INTRAMUSCULAR | Status: AC | PRN
Start: 2022-10-10 — End: 2022-10-10
  Administered 2022-10-10 (×2): 50 ug via INTRAVENOUS

## 2022-10-10 MED ORDER — SODIUM CHLORIDE 0.9 % IV SOLN: INTRAVENOUS | Status: DC

## 2022-10-10 MED ORDER — FENTANYL CITRATE (PF) 100 MCG/2ML IJ SOLN
INTRAMUSCULAR | Status: AC
  Filled 2022-10-10: qty 2

## 2022-10-10 MED ORDER — DIPHENHYDRAMINE HCL 50 MG/ML IJ SOLN
INTRAMUSCULAR | Status: AC | PRN
  Administered 2022-10-10: 25 mg via INTRAVENOUS

## 2022-10-10 MED ORDER — HYDROCODONE-ACETAMINOPHEN 5-325 MG PO TABS: 1.0000 | ORAL_TABLET | ORAL | Status: DC | PRN

## 2022-10-10 MED ORDER — MIDAZOLAM HCL 2 MG/2ML IJ SOLN
INTRAMUSCULAR | Status: AC | PRN
  Administered 2022-10-10 (×2): 1 mg via INTRAVENOUS
  Administered 2022-10-10: 2 mg via INTRAVENOUS

## 2022-10-10 MED ORDER — MIDAZOLAM HCL 2 MG/2ML IJ SOLN
INTRAMUSCULAR | Status: AC
  Filled 2022-10-10: qty 4

## 2022-10-10 NOTE — Discharge Instructions (Signed)
Please call Interventional Radiology clinic [phone] with any questions or concerns. ? ?You may remove your dressing and shower tomorrow. ? ? ?Bone Marrow Aspiration and Bone Marrow Biopsy, Adult, Care After ?This sheet gives you information about how to care for yourself after your procedure. Your health care provider may also give you more specific instructions. If you have problems or questions, contact your health care provider. ?What can I expect after the procedure? ?After the procedure, it is common to have: ?Mild pain and tenderness. ?Swelling. ?Bruising. ?Follow these instructions at home: ?Puncture site care ?Follow instructions from your health care provider about how to take care of the puncture site. Make sure you: ?Wash your hands with soap and water before and after you change your bandage (dressing). If soap and water are not available, use hand sanitizer. ?Change your dressing as told by your health care provider. ?Check your puncture site every day for signs of infection. Check for: ?More redness, swelling, or pain. ?Fluid or blood. ?Warmth. ?Pus or a bad smell.   ?Activity ?Return to your normal activities as told by your health care provider. Ask your health care provider what activities are safe for you. ?Do not lift anything that is heavier than 10 lb (4.5 kg), or the limit that you are told, until your health care provider says that it is safe. ?Do not drive for 24 hours if you were given a sedative during your procedure. ?General instructions ?Take over-the-counter and prescription medicines only as told by your health care provider. ?Do not take baths, swim, or use a hot tub until your health care provider approves. Ask your health care provider if you may take showers. You may only be allowed to take sponge baths. ?If directed, put ice on the affected area. To do this: ?Put ice in a plastic bag. ?Place a towel between your skin and the bag. ?Leave the ice on for 20 minutes, 2-3 times a  day. ?Keep all follow-up visits as told by your health care provider. This is important.   ?Contact a health care provider if: ?Your pain is not controlled with medicine. ?You have a fever. ?You have more redness, swelling, or pain around the puncture site. ?You have fluid or blood coming from the puncture site. ?Your puncture site feels warm to the touch. ?You have pus or a bad smell coming from the puncture site. ?Summary ?After the procedure, it is common to have mild pain, tenderness, swelling, and bruising. ?Follow instructions from your health care provider about how to take care of the puncture site and what activities are safe for you. ?Take over-the-counter and prescription medicines only as told by your health care provider. ?Contact a health care provider if you have any signs of infection, such as fluid or blood coming from the puncture site. ?This information is not intended to replace advice given to you by your health care provider. Make sure you discuss any questions you have with your health care provider. ?Document Revised: 06/16/2018 Document Reviewed: 06/16/2018 ?Elsevier Patient Education ? 2021 Elsevier Inc. ? ? ?Moderate Conscious Sedation, Adult, Care After ?This sheet gives you information about how to care for yourself after your procedure. Your health care provider may also give you more specific instructions. If you have problems or questions, contact your health care provider. ?What can I expect after the procedure? ?After the procedure, it is common to have: ?Sleepiness for several hours. ?Impaired judgment for several hours. ?Difficulty with balance. ?Vomiting if you eat too   soon. ?Follow these instructions at home: ?For the time period you were told by your health care provider: ?Rest. ?Do not participate in activities where you could fall or become injured. ?Do not drive or use machinery. ?Do not drink alcohol. ?Do not take sleeping pills or medicines that cause drowsiness. ?Do not  make important decisions or sign legal documents. ?Do not take care of children on your own.  ?  ?  ?Eating and drinking ?Follow the diet recommended by your health care provider. ?Drink enough fluid to keep your urine pale yellow. ?If you vomit: ?Drink water, juice, or soup when you can drink without vomiting. ?Make sure you have little or no nausea before eating solid foods.   ?General instructions ?Take over-the-counter and prescription medicines only as told by your health care provider. ?Have a responsible adult stay with you for the time you are told. It is important to have someone help care for you until you are awake and alert. ?Do not smoke. ?Keep all follow-up visits as told by your health care provider. This is important. ?Contact a health care provider if: ?You are still sleepy or having trouble with balance after 24 hours. ?You feel light-headed. ?You keep feeling nauseous or you keep vomiting. ?You develop a rash. ?You have a fever. ?You have redness or swelling around the IV site. ?Get help right away if: ?You have trouble breathing. ?You have new-onset confusion at home. ?Summary ?After the procedure, it is common to feel sleepy, have impaired judgment, or feel nauseous if you eat too soon. ?Rest after you get home. Know the things you should not do after the procedure. ?Follow the diet recommended by your health care provider and drink enough fluid to keep your urine pale yellow. ?Get help right away if you have trouble breathing or new-onset confusion at home. ?This information is not intended to replace advice given to you by your health care provider. Make sure you discuss any questions you have with your health care provider. ?Document Revised: 05/28/2019 Document Reviewed: 12/24/2018 ?Elsevier Patient Education ? 2021 Elsevier Inc.  ?

## 2022-10-10 NOTE — Procedures (Signed)
Interventional Radiology Procedure: ? ? ?Indications: Leukopenia ? ?Procedure: CT guided bone marrow biopsy ? ?Findings: 2 aspirates and 1 core from right ilium ? ?Complications: None ?    ?EBL: Minimal, less than 10 ml ? ?Plan: Discharge to home in one hour. ? ? ?Adam R. Anselm Pancoast, MD  ?Pager: [phone] ? ?  ?

## 2022-10-11 ENCOUNTER — Telehealth: Payer: Self-pay | Admitting: *Deleted

## 2022-10-11 NOTE — Telephone Encounter (Signed)
This RN

## 2022-10-17 LAB — SURGICAL PATHOLOGY

## 2022-10-18 ENCOUNTER — Inpatient Hospital Stay: Payer: BC Managed Care – PPO | Attending: Hematology and Oncology | Admitting: Hematology and Oncology

## 2022-10-18 DIAGNOSIS — D0512 Intraductal carcinoma in situ of left breast: Secondary | ICD-10-CM | POA: Diagnosis not present

## 2022-10-18 NOTE — Assessment & Plan Note (Signed)
11/02/2021:Screening mammogram detected left breast calcifications measuring 18.6 cm in span.  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 (GF had CLL) 11/14/2021: WBC 2.6, hemoglobin 11.5, ANC 1.2 03/11/2022: WBC 3.0, hemoglobin 11.6, ANC 1.5, ANA negative, folate 7.7 09/13/2022: WBC 2.7, hemoglobin 12.1, platelets 205, ANC 1.1 10/16/2022: WBC 2.9, hemoglobin 12.3, platelets 241, ANC 1.2  Differential diagnosis: Ethnicity related versus Seroquel  Bone marrow biopsy 10/10/2022: Normocellular bone marrow (50%) with trilineage hematopoiesis Continue watchful monitoring.

## 2022-10-18 NOTE — Progress Notes (Signed)
HEMATOLOGY-ONCOLOGY TELEPHONE VISIT PROGRESS NOTE  I connected with our patient on 10/18/22 at  9:00 AM EDT by telephone and verified that I am speaking with the correct person using two identifiers.  I discussed the limitations, risks, security and privacy concerns of performing an evaluation and management service by telephone and the availability of in person appointments.  I also discussed with the patient that there may be a patient responsible charge related to this service. The patient expressed understanding and agreed to proceed.   History of Present Illness: Casey Lang is a 42 y.o. female is here because of recent diagnosis of left breast DCIS. She presents to the clinic for a telephone follow-up to discuss labs.  Her major complaint today is related to her left hip.  It has been bothering her for several months.  Even before bone marrow biopsy.  She did have bone marrow biopsy site pain but this is much different.  She had an x-ray of the hip which did not reveal any abnormalities other than degenerative changes.  She is going to see a primary care physician and discussed about getting an MRI.  Oncology History  Ductal carcinoma in situ (DCIS) of left breast  11/02/2021 Initial Diagnosis   Screening mammogram detected left breast calcifications measuring 18.6 cm in span.  2 biopsies were performed both anterior and posterior revealed high-grade DCIS with solid cribriform and micropapillary features with necrosis ER 95%, PR 80% to 50%   11/14/2021 Cancer Staging   Staging form: Breast, AJCC 8th Edition - Clinical: Stage 0 (cTis (DCIS), cN0, cM0, G3, ER+, PR+, HER2: Not Assessed) - Signed by Serena Croissant, MD on 11/14/2021 Histologic grading system: 3 grade system    Genetic Testing   [facility] CustomNext Panel+RNA was Negative. Report date is 11/22/2021.  The CustomNext-Cancer+RNAinsight panel offered by [facility] includes sequencing and rearrangement analysis for the following  47 genes:  APC, ATM, AXIN2, BARD1, BMPR1A, BRCA1, BRCA2, BRIP1, CDH1, CDK4, CDKN2A, CHEK2, CTNNA1, DICER1, EPCAM, GREM1, HOXB13, KIT, MEN1, MLH1, MSH2, MSH3, MSH6, MUTYH, NBN, NF1, NTHL1, PALB2, PDGFRA, PMS2, POLD1, POLE, PTEN, RAD50, RAD51C, RAD51D, SDHA, SDHB, SDHC, SDHD, SMAD4, SMARCA4, STK11, TP53, TSC1, TSC2, and VHL.  RNA data is routinely analyzed for use in variant interpretation for all genes.   11/29/2021 Surgery   Bilateral mastectomies Right mastectomy: Benign Left mastectomy: High-grade DCIS with solid and papillary types, 0/7 lymph nodes negative ER 95%, PR 80% strong staining     REVIEW OF SYSTEMS:   Constitutional: Denies fevers, chills or abnormal weight loss All other systems were reviewed with the patient and are negative. Observations/Objective:     Assessment Plan:  Ductal carcinoma in situ (DCIS) of left breast 11/02/2021:Screening mammogram detected left breast calcifications measuring 18.6 cm in span.  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 (GF had CLL) 11/14/2021: WBC 2.6, hemoglobin 11.5, ANC 1.2 03/11/2022: WBC 3.0, hemoglobin 11.6, ANC 1.5, ANA negative, folate 7.7 09/13/2022: WBC 2.7, hemoglobin 12.1, platelets 205, ANC 1.1 10/16/2022: WBC 2.9, hemoglobin 12.3, platelets 241, ANC 1.2  Differential diagnosis: Ethnicity related versus Seroquel  Bone marrow  biopsy 10/10/2022: Normocellular bone marrow (50%) with trilineage hematopoiesis Continue watchful monitoring. Severe left Hip Pain: Going to see PCP. Xrays show  degenerative changes. Might need MRI hip Went to ED previously  6 month lab and MD visit  I discussed the assessment and treatment plan with the patient. The patient was provided an opportunity to ask questions and all were answered. The patient agreed with the plan and demonstrated an understanding of the instructions. The patient was advised to call back or seek an in-person evaluation if the symptoms worsen or if the condition fails to improve as anticipated.   I provided 12 minutes of non-face-to-face time during this encounter.  This includes time for charting and coordination of care   Tamsen Meek, MD  I Janan Ridge am acting as a scribe for Dr.Lillybeth Tal  I have reviewed the above documentation for accuracy and completeness, and I agree with the above.

## 2022-10-22 ENCOUNTER — Encounter (HOSPITAL_COMMUNITY): Payer: Self-pay

## 2022-11-25 ENCOUNTER — Encounter: Payer: Self-pay | Admitting: Hematology and Oncology

## 2022-12-09 ENCOUNTER — Ambulatory Visit: Payer: BC Managed Care – PPO | Attending: General Surgery

## 2022-12-09 VITALS — Wt 225.5 lb

## 2022-12-09 DIAGNOSIS — Z483 Aftercare following surgery for neoplasm: Secondary | ICD-10-CM | POA: Insufficient documentation

## 2022-12-09 NOTE — Therapy (Signed)
OUTPATIENT PHYSICAL THERAPY SOZO SCREENING NOTE   Patient Name: Casey Lang MRN: [id number] DOB:Apr 09, 1980, 42 y.o., female Today's Date: 12/09/2022  PCP: System, Provider Not In REFERRING PROVIDER: Griselda Miner, MD   PT End of Session - 12/09/22 1629     Visit Number 2   # unchanged due to screen only   PT Start Time 1627    PT Stop Time 1634    PT Time Calculation (min) 7 min    Activity Tolerance Patient tolerated treatment well    Behavior During Therapy [facility] for tasks assessed/performed             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   Depression    Leukopenia 10/19/2021   Mental disorder    PTSD (post-traumatic stress disorder)    Right foot injury 10/09/2012   Resolved without complication   Past Surgical History:  Procedure Laterality Date   FOOT SURGERY     HEMATOMA EVACUATION N/A 07/18/2016   Procedure: EVACUATION HEMATOMA;  Surgeon: Reva Bores, MD;  Location: [facility] BIRTHING SUITES;  Service: Gynecology;  Laterality: N/A;   LAPAROSCOPIC GASTRIC SLEEVE RESECTION  10/09/2012   Surgery was on 09/30/2011   LESION EXCISION WITH COMPLEX REPAIR Bilateral 03/08/2022   Procedure: LESION EXCISION WITH COMPLEX REPAIR > 20cm;  Surgeon: Glenna Fellows, MD;  Location: [facility];  Service: Plastics;  Laterality: Bilateral;   MASTECTOMY W/ SENTINEL NODE BIOPSY Left 11/29/2021   Procedure: LEFT MASTECTOMY WITH SENTINEL LYMPH NODE BIOPSY;  Surgeon: Griselda Miner, MD;  Location: [facility];  Service: General;  Laterality: Left;   TOTAL MASTECTOMY Right 11/29/2021   Procedure: RIGHT TOTAL MASTECTOMY;  Surgeon: Griselda Miner, MD;  Location: [facility];  Service: General;  Laterality: Right;   TUBAL LIGATION Bilateral 07/18/2016   Procedure: POST PARTUM TUBAL LIGATION;  Surgeon: Reva Bores,  MD;  Location: [facility] BIRTHING SUITES;  Service: Gynecology;  Laterality: Bilateral;   Patient Active Problem List   Diagnosis Date Noted   Genetic testing 11/26/2021   Family history of breast cancer 11/14/2021   Ductal carcinoma in situ (DCIS) of left breast 11/12/2021   Leukopenia 10/19/2021   Iron deficiency anemia due to chronic blood loss 06/11/2021   Gestational hypertension 07/17/2016   Gestational hypertension, third trimester 05/03/2016   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   Bipolar I disorder, most recent episode (or current) manic (HCC) 03/22/2015   PTSD (post-traumatic stress disorder)    GAD (generalized anxiety disorder)    Hyperprolactinemia (HCC) 03/02/2015   Bipolar disorder, curr episode mixed, severe, with psychotic features (HCC) 02/28/2015    REFERRING DIAG: left breast cancer at risk for lymphedema  THERAPY DIAG: Aftercare following surgery for neoplasm  PERTINENT HISTORY: Patient was diagnosed on 08/24/2021 with left high grade DCIS. It measures 18.6 cm and is located in the upper outer quadrant. It is ER/PR positive. Patient elected for bilateral mastectomies, completed 11/29/2021. Final pathology right breast benign, left breast two areas high grade DCIS, margins clear, 0/7 SLN. Drains removed 12/24/2021. 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   PAIN:  Are you having pain? No  SOZO SCREENING: Patient was assessed today using the SOZO machine to determine the lymphedema index score. This was compared to her baseline score. It was determined that she is within the recommended range when compared to her baseline and no further action is needed at this time. She will continue SOZO screenings.  These are done every 3 months for 2 years post operatively followed by every 6 months for 2 years, and then annually.    L-DEX FLOWSHEETS - 12/09/22 1600       L-DEX LYMPHEDEMA SCREENING   Measurement Type Unilateral    L-DEX MEASUREMENT EXTREMITY Upper Extremity    POSITION  Standing    DOMINANT SIDE Right    At Risk Side Left    BASELINE SCORE (UNILATERAL) 3.5    L-DEX SCORE (UNILATERAL) 1.3    VALUE CHANGE (UNILAT) -2.2              Hermenia Bers, PTA 12/09/2022, 4:35 PM

## 2023-01-07 ENCOUNTER — Encounter: Payer: Self-pay | Admitting: Hematology and Oncology

## 2023-03-03 ENCOUNTER — Encounter: Payer: Self-pay | Admitting: Hematology and Oncology

## 2023-03-10 ENCOUNTER — Ambulatory Visit: Payer: MEDICAID | Attending: General Surgery

## 2023-03-10 VITALS — Wt 221.5 lb

## 2023-03-10 DIAGNOSIS — Z483 Aftercare following surgery for neoplasm: Secondary | ICD-10-CM | POA: Insufficient documentation

## 2023-03-10 NOTE — Therapy (Signed)
OUTPATIENT PHYSICAL THERAPY SOZO SCREENING NOTE   Patient Name: Casey Lang MRN: [id number] DOB:[birth year], 43 y.o., female Today's Date: 03/10/2023  PCP: System, Provider Not In REFERRING PROVIDER: Griselda Miner, MD   PT End of Session - 03/10/23 1646     Visit Number 2   # unchanged due to screen only   PT Start Time 1644    PT Stop Time 1648    PT Time Calculation (min) 4 min    Activity Tolerance Patient tolerated treatment well    Behavior During Therapy [facility] for tasks assessed/performed             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   Depression    Leukopenia 10/19/2021   Mental disorder    PTSD (post-traumatic stress disorder)    Right foot injury 10/09/2012   Resolved without complication   Past Surgical History:  Procedure Laterality Date   FOOT SURGERY     HEMATOMA EVACUATION N/A 07/18/2016   Procedure: EVACUATION HEMATOMA;  Surgeon: Reva Bores, MD;  Location: [facility] BIRTHING SUITES;  Service: Gynecology;  Laterality: N/A;   LAPAROSCOPIC GASTRIC SLEEVE RESECTION  10/09/2012   Surgery was on 09/30/2011   LESION EXCISION WITH COMPLEX REPAIR Bilateral 03/08/2022   Procedure: LESION EXCISION WITH COMPLEX REPAIR > 20cm;  Surgeon: Glenna Fellows, MD;  Location: [facility];  Service: Plastics;  Laterality: Bilateral;   MASTECTOMY W/ SENTINEL NODE BIOPSY Left 11/29/2021   Procedure: LEFT MASTECTOMY WITH SENTINEL LYMPH NODE BIOPSY;  Surgeon: Griselda Miner, MD;  Location: [facility];  Service: General;  Laterality: Left;   TOTAL MASTECTOMY Right 11/29/2021   Procedure: RIGHT TOTAL MASTECTOMY;  Surgeon: Griselda Miner, MD;  Location:  SURGERY CENTER;  Service: General;  Laterality: Right;   TUBAL LIGATION Bilateral 07/18/2016   Procedure: POST PARTUM TUBAL LIGATION;  Surgeon: Reva Bores,  MD;  Location: [facility] BIRTHING SUITES;  Service: Gynecology;  Laterality: Bilateral;   Patient Active Problem List   Diagnosis Date Noted   Genetic testing 11/26/2021   Family history of breast cancer 11/14/2021   Ductal carcinoma in situ (DCIS) of left breast 11/12/2021   Leukopenia 10/19/2021   Iron deficiency anemia due to chronic blood loss 06/11/2021   Gestational hypertension 07/17/2016   Gestational hypertension, third trimester 05/03/2016   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   Bipolar I disorder, most recent episode (or current) manic (HCC) 03/22/2015   PTSD (post-traumatic stress disorder)    GAD (generalized anxiety disorder)    Hyperprolactinemia (HCC) 03/02/2015   Bipolar disorder, curr episode mixed, severe, with psychotic features (HCC) 02/28/2015    REFERRING DIAG: left breast cancer at risk for lymphedema  THERAPY DIAG: Aftercare following surgery for neoplasm  PERTINENT HISTORY: Patient was diagnosed on 08/24/2021 with left high grade DCIS. It measures 18.6 cm and is located in the upper outer quadrant. It is ER/PR positive. Patient elected for bilateral mastectomies on 11/29/2021. Final pathology right breast benign, left breast two areas high grade DCIS, margins clear, 0/7 SLN. Drains removed 12/24/2021. 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   PAIN:  Are you having pain? No  SOZO SCREENING: Patient was assessed today using the SOZO machine to determine the lymphedema index score. This was compared to her baseline score. It was determined that she is within the recommended range when compared to her baseline and no further action is needed at this time. She will continue SOZO screenings. These are  done every 3 months for 2 years post operatively followed by every 6 months for 2 years, and then annually.    L-DEX FLOWSHEETS - 03/10/23 1600       L-DEX LYMPHEDEMA SCREENING   Measurement Type Unilateral    L-DEX MEASUREMENT EXTREMITY Upper Extremity    POSITION  Standing    DOMINANT SIDE Right    At Risk Side Left    BASELINE SCORE (UNILATERAL) 3.5    L-DEX SCORE (UNILATERAL) 0.7    VALUE CHANGE (UNILAT) -2.8              Hermenia Bers, PTA 03/10/2023, 4:48 PM

## 2023-06-09 ENCOUNTER — Ambulatory Visit: Payer: Self-pay | Attending: General Surgery

## 2023-06-09 VITALS — Wt 222.1 lb

## 2023-06-09 DIAGNOSIS — Z483 Aftercare following surgery for neoplasm: Secondary | ICD-10-CM | POA: Insufficient documentation

## 2023-06-09 NOTE — Therapy (Signed)
 OUTPATIENT PHYSICAL THERAPY SOZO SCREENING NOTE   Patient Name: Casey Lang MRN: [id number] DOB:[birth year], 43 y.o., female Today's Date: 06/09/2023  PCP: System, Provider Not In REFERRING PROVIDER: Caralyn Chandler, MD   PT End of Session - 06/09/23 1631     Visit Number 2   # unchanged due to screen only   PT Start Time 1629    PT Stop Time 1634    PT Time Calculation (min) 5 min    Activity Tolerance Patient tolerated treatment well    Behavior During Therapy [facility] for tasks assessed/performed             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   Depression    Leukopenia 10/19/2021   Mental disorder    PTSD (post-traumatic stress disorder)    Right foot injury 10/09/2012   Resolved without complication   Past Surgical History:  Procedure Laterality Date   FOOT SURGERY     HEMATOMA EVACUATION N/A 07/18/2016   Procedure: EVACUATION HEMATOMA;  Surgeon: Granville Layer, MD;  Location: [facility] BIRTHING SUITES;  Service: Gynecology;  Laterality: N/A;   LAPAROSCOPIC GASTRIC SLEEVE RESECTION  10/09/2012   Surgery was on 09/30/2011   LESION EXCISION WITH COMPLEX REPAIR Bilateral 03/08/2022   Procedure: LESION EXCISION WITH COMPLEX REPAIR > 20cm;  Surgeon: Alger Infield, MD;  Location: [facility];  Service: Plastics;  Laterality: Bilateral;   MASTECTOMY W/ SENTINEL NODE BIOPSY Left 11/29/2021   Procedure: LEFT MASTECTOMY WITH SENTINEL LYMPH NODE BIOPSY;  Surgeon: Caralyn Chandler, MD;  Location: [facility];  Service: General;  Laterality: Left;   TOTAL MASTECTOMY Right 11/29/2021   Procedure: RIGHT TOTAL MASTECTOMY;  Surgeon: Caralyn Chandler, MD;  Location:  SURGERY CENTER;  Service: General;  Laterality: Right;   TUBAL LIGATION Bilateral 07/18/2016   Procedure: POST PARTUM TUBAL LIGATION;  Surgeon: Granville Layer,  MD;  Location: [facility] BIRTHING SUITES;  Service: Gynecology;  Laterality: Bilateral;   Patient Active Problem List   Diagnosis Date Noted   Genetic testing 11/26/2021   Family history of breast cancer 11/14/2021   Ductal carcinoma in situ (DCIS) of left breast 11/12/2021   Leukopenia 10/19/2021   Iron deficiency anemia due to chronic blood loss 06/11/2021   Gestational hypertension 07/17/2016   Gestational hypertension, third trimester 05/03/2016   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   Bipolar I disorder, most recent episode (or current) manic (HCC) 03/22/2015   PTSD (post-traumatic stress disorder)    GAD (generalized anxiety disorder)    Hyperprolactinemia (HCC) 03/02/2015   Bipolar disorder, curr episode mixed, severe, with psychotic features (HCC) 02/28/2015    REFERRING DIAG: left breast cancer at risk for lymphedema  THERAPY DIAG: Aftercare following surgery for neoplasm  PERTINENT HISTORY: Patient was diagnosed on 08/24/2021 with left high grade DCIS. It measures 18.6 cm and is located in the upper outer quadrant. It is ER/PR positive. Patient elected for bilateral mastectomies on 11/29/2021. Final pathology right breast benign, left breast two areas high grade DCIS, margins clear, 0/7 SLN. Drains removed 12/24/2021. 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   PAIN:  Are you having pain? No  SOZO SCREENING: Patient was assessed today using the SOZO machine to determine the lymphedema index score. This was compared to her baseline score. It was determined that she is within the recommended range when compared to her baseline and no further action is needed at this time. She will continue SOZO screenings. These are  done every 3 months for 2 years post operatively followed by every 6 months for 2 years, and then annually.    L-DEX FLOWSHEETS - 06/09/23 1600       L-DEX LYMPHEDEMA SCREENING   Measurement Type Unilateral    L-DEX MEASUREMENT EXTREMITY Upper Extremity    POSITION  Standing    DOMINANT SIDE Right    At Risk Side Left    BASELINE SCORE (UNILATERAL) 3.5    L-DEX SCORE (UNILATERAL) -1.5    VALUE CHANGE (UNILAT) -5              Denyce Flank, PTA 06/09/2023, 4:33 PM

## 2023-11-04 ENCOUNTER — Ambulatory Visit (HOSPITAL_COMMUNITY)
Admission: EM | Admit: 2023-11-04 | Discharge: 2023-11-04 | Disposition: A | Discharge status: Other Acute Inpatient Hospital | Attending: Psychiatry | Admitting: Psychiatry

## 2023-11-04 ENCOUNTER — Encounter: Payer: Self-pay | Admitting: Hematology and Oncology

## 2023-11-04 ENCOUNTER — Inpatient Hospital Stay (HOSPITAL_COMMUNITY)
Admission: AD | Admit: 2023-11-04 | Discharge: 2023-11-07 | DRG: 885 | Disposition: A | Discharge status: Home / Self Care | Source: Intra-hospital

## 2023-11-04 DIAGNOSIS — Z818 Family history of other mental and behavioral disorders: Secondary | ICD-10-CM | POA: Diagnosis not present

## 2023-11-04 DIAGNOSIS — Z803 Family history of malignant neoplasm of breast: Secondary | ICD-10-CM | POA: Diagnosis not present

## 2023-11-04 DIAGNOSIS — Z91128 Patient's intentional underdosing of medication regimen for other reason: Secondary | ICD-10-CM | POA: Diagnosis not present

## 2023-11-04 DIAGNOSIS — F3162 Bipolar disorder, current episode mixed, moderate: Principal | ICD-10-CM | POA: Diagnosis present

## 2023-11-04 DIAGNOSIS — F411 Generalized anxiety disorder: Principal | ICD-10-CM | POA: Diagnosis present

## 2023-11-04 DIAGNOSIS — F32A Depression, unspecified: Secondary | ICD-10-CM

## 2023-11-04 DIAGNOSIS — Z8249 Family history of ischemic heart disease and other diseases of the circulatory system: Secondary | ICD-10-CM | POA: Diagnosis not present

## 2023-11-04 DIAGNOSIS — F4310 Post-traumatic stress disorder, unspecified: Secondary | ICD-10-CM | POA: Diagnosis not present

## 2023-11-04 DIAGNOSIS — Z91419 Personal history of unspecified adult abuse: Secondary | ICD-10-CM

## 2023-11-04 DIAGNOSIS — G4700 Insomnia, unspecified: Secondary | ICD-10-CM | POA: Diagnosis present

## 2023-11-04 DIAGNOSIS — Z853 Personal history of malignant neoplasm of breast: Secondary | ICD-10-CM | POA: Insufficient documentation

## 2023-11-04 DIAGNOSIS — Z79899 Other long term (current) drug therapy: Secondary | ICD-10-CM | POA: Diagnosis not present

## 2023-11-04 DIAGNOSIS — Z9013 Acquired absence of bilateral breasts and nipples: Secondary | ICD-10-CM | POA: Diagnosis not present

## 2023-11-04 DIAGNOSIS — F319 Bipolar disorder, unspecified: Secondary | ICD-10-CM | POA: Diagnosis not present

## 2023-11-04 DIAGNOSIS — Z62810 Personal history of physical and sexual abuse in childhood: Secondary | ICD-10-CM

## 2023-11-04 DIAGNOSIS — Z9152 Personal history of nonsuicidal self-harm: Secondary | ICD-10-CM

## 2023-11-04 DIAGNOSIS — Z833 Family history of diabetes mellitus: Secondary | ICD-10-CM | POA: Diagnosis not present

## 2023-11-04 DIAGNOSIS — E559 Vitamin D deficiency, unspecified: Secondary | ICD-10-CM | POA: Diagnosis present

## 2023-11-04 DIAGNOSIS — Z9884 Bariatric surgery status: Secondary | ICD-10-CM

## 2023-11-04 DIAGNOSIS — Z9151 Personal history of suicidal behavior: Secondary | ICD-10-CM

## 2023-11-04 LAB — CBC WITH DIFFERENTIAL/PLATELET
Abs Immature Granulocytes: 0 K/uL (ref 0.00–0.07)
Basophils Absolute: 0 K/uL (ref 0.0–0.1)
Basophils Relative: 1 %
Eosinophils Absolute: 0 K/uL (ref 0.0–0.5)
Eosinophils Relative: 0 %
HCT: 39.5 % (ref 36.0–46.0)
Hemoglobin: 12.8 g/dL (ref 12.0–15.0)
Immature Granulocytes: 0 %
Lymphocytes Relative: 36 %
Lymphs Abs: 1 K/uL (ref 0.7–4.0)
MCH: 30.5 pg (ref 26.0–34.0)
MCHC: 32.4 g/dL (ref 30.0–36.0)
MCV: 94 fL (ref 80.0–100.0)
Monocytes Absolute: 0.2 K/uL (ref 0.1–1.0)
Monocytes Relative: 7 %
Neutro Abs: 1.6 K/uL — ABNORMAL LOW (ref 1.7–7.7)
Neutrophils Relative %: 56 %
Platelets: 233 K/uL (ref 150–400)
RBC: 4.2 MIL/uL (ref 3.87–5.11)
RDW: 13.1 % (ref 11.5–15.5)
WBC: 2.8 K/uL — ABNORMAL LOW (ref 4.0–10.5)
nRBC: 0 % (ref 0.0–0.2)

## 2023-11-04 LAB — POCT URINE DRUG SCREEN - MANUAL ENTRY (I-SCREEN)
POC Amphetamine UR: NOT DETECTED
POC Buprenorphine (BUP): NOT DETECTED
POC Cocaine UR: NOT DETECTED
POC Marijuana UR: NOT DETECTED
POC Methadone UR: NOT DETECTED
POC Methamphetamine UR: NOT DETECTED
POC Morphine: NOT DETECTED
POC Oxazepam (BZO): NOT DETECTED
POC Oxycodone UR: NOT DETECTED
POC Secobarbital (BAR): NOT DETECTED

## 2023-11-04 LAB — COMPREHENSIVE METABOLIC PANEL WITH GFR
ALT: 7 U/L (ref 0–44)
AST: 12 U/L — ABNORMAL LOW (ref 15–41)
Albumin: 4 g/dL (ref 3.5–5.0)
Alkaline Phosphatase: 33 U/L — ABNORMAL LOW (ref 38–126)
Anion gap: 11 (ref 5–15)
BUN: 16 mg/dL (ref 6–20)
CO2: 25 mmol/L (ref 22–32)
Calcium: 9.3 mg/dL (ref 8.9–10.3)
Chloride: 104 mmol/L (ref 98–111)
Creatinine, Ser: 0.88 mg/dL (ref 0.44–1.00)
GFR, Estimated: >60 mL/min (ref >60)
Glucose, Bld: 83 mg/dL (ref 70–99)
Potassium: 3.9 mmol/L (ref 3.5–5.1)
Sodium: 140 mmol/L (ref 135–145)
Total Bilirubin: 1.3 mg/dL — ABNORMAL HIGH (ref 0.0–1.2)
Total Protein: 6.5 g/dL (ref 6.5–8.1)

## 2023-11-04 LAB — HEMOGLOBIN A1C
Hgb A1c MFr Bld: 4.4 % — ABNORMAL LOW (ref 4.8–5.6)
Mean Plasma Glucose: 79.58 mg/dL

## 2023-11-04 LAB — LIPID PANEL
Cholesterol: 188 mg/dL (ref 0–200)
HDL: 47 mg/dL (ref >40)
LDL Cholesterol: 117 mg/dL — ABNORMAL HIGH (ref 0–99)
Total CHOL/HDL Ratio: 4 ratio
Triglycerides: 118 mg/dL (ref <150)
VLDL: 24 mg/dL (ref 0–40)

## 2023-11-04 LAB — MAGNESIUM: Magnesium: 2.3 mg/dL (ref 1.7–2.4)

## 2023-11-04 LAB — TSH: TSH: 1.251 u[IU]/mL (ref 0.350–4.500)

## 2023-11-04 LAB — ETHANOL: Alcohol, Ethyl (B): <15 mg/dL (ref <15)

## 2023-11-04 MED ORDER — LORAZEPAM 2 MG/ML IJ SOLN: 2.0000 mg | Freq: Three times a day (TID) | INTRAMUSCULAR | Status: DC | PRN

## 2023-11-04 MED ORDER — HYDROXYZINE HCL 25 MG PO TABS
25.0000 mg | ORAL_TABLET | Freq: Three times a day (TID) | ORAL | Status: DC | PRN
  Administered 2023-11-05 – 2023-11-06 (×4): 25 mg via ORAL
  Filled 2023-11-04 (×5): qty 1

## 2023-11-04 MED ORDER — MAGNESIUM HYDROXIDE 400 MG/5ML PO SUSP
30.0000 mL | Freq: Every day | ORAL | Status: DC | PRN
  Administered 2023-11-05: 30 mL via ORAL
  Filled 2023-11-04: qty 30

## 2023-11-04 MED ORDER — DIPHENHYDRAMINE HCL 50 MG/ML IJ SOLN: 50.0000 mg | Freq: Three times a day (TID) | INTRAMUSCULAR | Status: DC | PRN

## 2023-11-04 MED ORDER — HALOPERIDOL LACTATE 5 MG/ML IJ SOLN: 10.0000 mg | Freq: Three times a day (TID) | INTRAMUSCULAR | Status: DC | PRN

## 2023-11-04 MED ORDER — ACETAMINOPHEN 325 MG PO TABS: 650.0000 mg | ORAL_TABLET | Freq: Four times a day (QID) | ORAL | Status: DC | PRN

## 2023-11-04 MED ORDER — ACETAMINOPHEN 325 MG PO TABS
650.0000 mg | ORAL_TABLET | Freq: Four times a day (QID) | ORAL | Status: DC | PRN
  Administered 2023-11-05: 650 mg via ORAL
  Filled 2023-11-04: qty 2

## 2023-11-04 MED ORDER — HALOPERIDOL LACTATE 5 MG/ML IJ SOLN: 5.0000 mg | Freq: Three times a day (TID) | INTRAMUSCULAR | Status: DC | PRN

## 2023-11-04 MED ORDER — ALUM & MAG HYDROXIDE-SIMETH 200-200-20 MG/5ML PO SUSP: 30.0000 mL | ORAL | Status: DC | PRN

## 2023-11-04 MED ORDER — HYDROXYZINE HCL 25 MG PO TABS
25.0000 mg | ORAL_TABLET | Freq: Three times a day (TID) | ORAL | Status: DC | PRN
  Administered 2023-11-04: 25 mg via ORAL
  Filled 2023-11-04: qty 1

## 2023-11-04 MED ORDER — QUETIAPINE FUMARATE 25 MG PO TABS: 25.0000 mg | ORAL_TABLET | Freq: Every day | ORAL | Status: DC

## 2023-11-04 MED ORDER — MAGNESIUM HYDROXIDE 400 MG/5ML PO SUSP: 30.0000 mL | Freq: Every day | ORAL | Status: DC | PRN

## 2023-11-04 MED ORDER — DIPHENHYDRAMINE HCL 50 MG PO CAPS
50.0000 mg | ORAL_CAPSULE | Freq: Three times a day (TID) | ORAL | Status: DC | PRN
  Administered 2023-11-04: 50 mg via ORAL
  Filled 2023-11-04: qty 1

## 2023-11-04 MED ORDER — HALOPERIDOL 5 MG PO TABS
5.0000 mg | ORAL_TABLET | Freq: Three times a day (TID) | ORAL | Status: DC | PRN
  Administered 2023-11-06: 5 mg via ORAL
  Filled 2023-11-04: qty 1

## 2023-11-04 MED ORDER — HALOPERIDOL 5 MG PO TABS
5.0000 mg | ORAL_TABLET | Freq: Three times a day (TID) | ORAL | Status: DC | PRN
  Administered 2023-11-04: 5 mg via ORAL
  Filled 2023-11-04: qty 1

## 2023-11-04 MED ORDER — DIPHENHYDRAMINE HCL 25 MG PO CAPS
50.0000 mg | ORAL_CAPSULE | Freq: Three times a day (TID) | ORAL | Status: DC | PRN
  Administered 2023-11-06: 50 mg via ORAL
  Filled 2023-11-04: qty 2

## 2023-11-04 NOTE — Progress Notes (Signed)
   11/04/23 1322  [facility] Triage Screening (Walk-ins at [facility] only)  How Did You Hear About Us ? Family/Friend  What Is the Reason for Your Visit/Call Today? Pt presents to [facility] this date with her mother, Chantell Kunkler.  Pt deneis SI, HI, AVH/Alchol/Drug use.  Pt reports prior sucide attempt by overdose twenty years ago.  Pt also reports prior self harm, during teen years by cutting.  Pt reports tapping off her Bipolar medication in May 2025.  Pt reports unable to sleep; also, currently having intense nightmare.  Pt reports she  is not currently seeing a therapist.  How Long Has This Been Causing You Problems? 1 wk - 1 month  Have You Recently Had Any Thoughts About Hurting Yourself? No  Are You Planning to Commit Suicide/Harm Yourself At This time? No  Have you Recently Had Thoughts About Hurting Someone Sherral? No  Are You Planning To Harm Someone At This Time? No  Physical Abuse Denies  Verbal Abuse Denies  Sexual Abuse Denies  Exploitation of patient/patient's resources Denies  Self-Neglect Denies  Are you currently experiencing any auditory, visual or other hallucinations? No  Have You Used Any Alcohol or Drugs in the Past 24 Hours? No  Do you have any current medical co-morbidities that require immediate attention? No  Clinician description of patient physical appearance/behavior: calm, engaged.  What Do You Feel Would Help You the Most Today? Treatment for Depression or other mood problem  If access to [facility] was not available, would you have sought care in the Emergency Department? Yes  Determination of Need Routine (7 days)  Options For Referral Intensive Outpatient Therapy

## 2023-11-04 NOTE — ED Notes (Signed)
 Report called to RN Dan, [facility] [phone].  Librarian, academic.

## 2023-11-04 NOTE — ED Notes (Signed)
 Pt c/o of chest pain, reports chest being tight. EKG completed given to np, [place]. Np notified.

## 2023-11-04 NOTE — ED Notes (Signed)
 Pt states, I just want a good night's rest and reports feeling overwhelmed with heaviness in chest, anxiety, and tension. Reports severe insomnia and distressing nightmares.  Denies current SI, HI, or hallucinations. Alert & oriented x4. Cooperative with admission process, appearing physically distressed. Speech clear and goal-directed. Mood self-reported as anxious, affect is congruent. No evidence of acute psychosis or agitation.
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
## 2023-11-04 NOTE — Plan of Care (Signed)
   Problem: Education: Goal: Knowledge of [place] [facility] Education information/materials will improve Outcome: Progressing Goal: Emotional status will improve Outcome: Progressing Goal: Mental status will improve Outcome: Progressing Goal: Verbalization of understanding the information provided will improve Outcome: Progressing

## 2023-11-04 NOTE — ED Notes (Signed)
 Pt in recliner, reports tightness has calm down administered prn benadryl  and haldol  for agitation, vitals taken stable

## 2023-11-04 NOTE — Progress Notes (Signed)
 Pt has been accepted to [facility] on 11/04/2023 Bed assignment: 306-01  Pt meets inpatient criteria per: Bridgette Salt, NP    Attending Physician will be: Dr. Prentis    Report can be called un:lwpu: Adult unit: [phone]  Pt can arrive after pending labs, vol, UDS, EKG.   Care Team Notified: [facility] Cherylynn Ernst RN,  [place], [place], NP,     [place] Lateshia Schmoker LCSW-A   11/04/2023 3:29 PM

## 2023-11-05 ENCOUNTER — Other Ambulatory Visit: Payer: Self-pay

## 2023-11-05 ENCOUNTER — Encounter (HOSPITAL_COMMUNITY): Payer: Self-pay

## 2023-11-05 DIAGNOSIS — F411 Generalized anxiety disorder: Secondary | ICD-10-CM

## 2023-11-05 DIAGNOSIS — F3162 Bipolar disorder, current episode mixed, moderate: Principal | ICD-10-CM

## 2023-11-05 LAB — PROLACTIN: Prolactin: 9.7 ng/mL (ref 4.8–33.4)

## 2023-11-05 MED ORDER — TRAZODONE HCL 50 MG PO TABS
25.0000 mg | ORAL_TABLET | Freq: Every evening | ORAL | Status: DC | PRN
  Filled 2023-11-05: qty 1

## 2023-11-05 MED ORDER — QUETIAPINE FUMARATE 25 MG PO TABS
ORAL_TABLET | ORAL | Status: AC
  Administered 2023-11-05: 25 mg via ORAL
  Filled 2023-11-05: qty 1

## 2023-11-05 MED ORDER — QUETIAPINE FUMARATE 50 MG PO TABS
50.0000 mg | ORAL_TABLET | Freq: Every day | ORAL | Status: DC
  Administered 2023-11-05: 50 mg via ORAL
  Filled 2023-11-05: qty 1

## 2023-11-05 NOTE — Progress Notes (Signed)
 D:  Patient's self inventory sheet, patient has fair sleep, sleep medication not helpful.  Fair appetite, normal energy level, good concentration.  Denied depression and hopeless, rated anxiety 2.  Denied withdrawals.  Denied SI.  Physical problems, headaches, denied physical pain.  Goal is rest, not worrying.  Needs melatonin.  No discharge plans at this time. A:  Medications administered per MD orders.  Emotional support and encouragement given patient. D:  Denied SI and HI, contracts for safety.  Denied A/V hallucinations.  Safety maintained with 15 minute checks.

## 2023-11-05 NOTE — Plan of Care (Signed)
  Problem: Education: Goal: Knowledge of [place] [facility] Education information/materials will improve 11/05/2023 2234 by Cecille Dan CROME, RN Outcome: Progressing 11/05/2023 2234 by Cecille Dan CROME, RN Outcome: Progressing Goal: Emotional status will improve 11/05/2023 2234 by Cecille Dan CROME, RN Outcome: Progressing 11/05/2023 2234 by Cecille Dan CROME, RN Outcome: Progressing Goal: Mental status will improve 11/05/2023 2234 by Cecille Dan CROME, RN Outcome: Progressing 11/05/2023 2234 by Cecille Dan CROME, RN Outcome: Progressing

## 2023-11-05 NOTE — H&P (Incomplete)
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 Diagnosis: Bipolar 1 disorder, mixed, moderate (HCC) Long Term Goal(s): Improvement in symptoms so as ready for discharge  Short Term Goals: Ability to identify changes in lifestyle to reduce recurrence of condition will improve  Physician Treatment Plan for Secondary Diagnosis: Principal Problem:   Bipolar 1 disorder, mixed, moderate (HCC) Active Problems:  Generalized anxiety disorder    Insomnia   Vitamin D  deficiency  Long Term Goal(s): Improvement in symptoms so as ready for discharge  Short Term Goals: Ability to identify changes in lifestyle to reduce recurrence of condition will improve  I certify that inpatient services furnished can reasonably be expected to improve the patient's condition.    Blair Chiquita Hint, NP 9/25/20258:55 AM

## 2023-11-05 NOTE — Progress Notes (Signed)
(  Sleep Hours) - 4.5 (Any PRNs that were needed, meds refused, or side effects to meds)- none (Any disturbances and when (visitation, over night)-  none (Concerns raised by the patient)- none  (SI/HI/AVH)- denies

## 2023-11-05 NOTE — Plan of Care (Signed)
 Nurse discussed anxiety, depression and coping skills with patient.

## 2023-11-05 NOTE — BH IP Treatment Plan (Signed)
 Interdisciplinary Treatment and Diagnostic Plan Update  11/05/2023 Time of Session: 1035AM Casey Lang MRN: [id number]  Principal Diagnosis: Generalized anxiety disorder  Secondary Diagnoses: Principal Problem:   Generalized anxiety disorder   Current Medications:  Current Facility-Administered Medications  Medication Dose Route Frequency Provider Last Rate Last Admin   acetaminophen  (TYLENOL ) tablet 650 mg  650 mg Oral Q6H PRN Olasunkanmi, Oluwatosin, NP   650 mg at 11/05/23 0741   alum & mag hydroxide-simeth (MAALOX/MYLANTA) 200-200-20 MG/5ML suspension 30 mL  30 mL Oral Q4H PRN Olasunkanmi, Oluwatosin, NP       haloperidol  (HALDOL ) tablet 5 mg  5 mg Oral TID PRN Olasunkanmi, Oluwatosin, NP       And   diphenhydrAMINE  (BENADRYL ) capsule 50 mg  50 mg Oral TID PRN Olasunkanmi, Oluwatosin, NP       haloperidol  lactate (HALDOL ) injection 5 mg  5 mg Intramuscular TID PRN Olasunkanmi, Oluwatosin, NP       And   diphenhydrAMINE  (BENADRYL ) injection 50 mg  50 mg Intramuscular TID PRN Olasunkanmi, Oluwatosin, NP       And   LORazepam  (ATIVAN ) injection 2 mg  2 mg Intramuscular TID PRN Olasunkanmi, Oluwatosin, NP       haloperidol  lactate (HALDOL ) injection 10 mg  10 mg Intramuscular TID PRN Olasunkanmi, Oluwatosin, NP       And   diphenhydrAMINE  (BENADRYL ) injection 50 mg  50 mg Intramuscular TID PRN Olasunkanmi, Oluwatosin, NP       And   LORazepam  (ATIVAN ) injection 2 mg  2 mg Intramuscular TID PRN Olasunkanmi, Oluwatosin, NP       hydrOXYzine  (ATARAX ) tablet 25 mg  25 mg Oral TID PRN Olasunkanmi, Oluwatosin, NP   25 mg at 11/05/23 0742   magnesium  hydroxide (MILK OF MAGNESIA) suspension 30 mL  30 mL Oral Daily PRN Olasunkanmi, Oluwatosin, NP   30 mL at 11/05/23 0744   QUEtiapine  (SEROQUEL ) tablet 50 mg  50 mg Oral QHS Bennett, Christal H, NP       traZODone  (DESYREL ) tablet 25 mg  25 mg Oral QHS PRN Bennett, Christal H, NP       PTA Medications: Medications Prior to Admission   Medication Sig Dispense Refill Last Dose/Taking   clonazePAM  (KLONOPIN ) 1 MG tablet Take 1 mg by mouth daily as needed for anxiety. (Patient not taking: Reported on 11/04/2023)      QUEtiapine  (SEROQUEL ) 25 MG tablet Take 25 mg by mouth at bedtime. (Patient not taking: Reported on 11/04/2023)       Patient Stressors:    Patient Strengths:    Treatment Modalities: Medication Management, Group therapy, Case management,  1 to 1 session with clinician, Psychoeducation, Recreational therapy.   Physician Treatment Plan for Primary Diagnosis: Generalized anxiety disorder Long Term Goal(s): Improvement in symptoms so as ready for discharge   Short Term Goals: Ability to identify changes in lifestyle to reduce recurrence of condition will improve  Medication Management: Evaluate patient's response, side effects, and tolerance of medication regimen.  Therapeutic Interventions: 1 to 1 sessions, Unit Group sessions and Medication administration.  Evaluation of Outcomes: Not Progressing  Physician Treatment Plan for Secondary Diagnosis: Principal Problem:   Generalized anxiety disorder  Long Term Goal(s): Improvement in symptoms so as ready for discharge   Short Term Goals: Ability to identify changes in lifestyle to reduce recurrence of condition will improve     Medication Management: Evaluate patient's response, side effects, and tolerance of medication regimen.  Therapeutic Interventions: 1 to 1 sessions,  Unit Group sessions and Medication administration.  Evaluation of Outcomes: Not Progressing   RN Treatment Plan for Primary Diagnosis: Generalized anxiety disorder Long Term Goal(s): Knowledge of disease and therapeutic regimen to maintain health will improve  Short Term Goals: Ability to remain free from injury will improve, Ability to verbalize frustration and anger appropriately will improve, Ability to demonstrate self-control, Ability to participate in decision making will  improve, Ability to verbalize feelings will improve, Ability to disclose and discuss suicidal ideas, Ability to identify and develop effective coping behaviors will improve, and Compliance with prescribed medications will improve  Medication Management: RN will administer medications as ordered by provider, will assess and evaluate patient's response and provide education to patient for prescribed medication. RN will report any adverse and/or side effects to prescribing provider.  Therapeutic Interventions: 1 on 1 counseling sessions, Psychoeducation, Medication administration, Evaluate responses to treatment, Monitor vital signs and CBGs as ordered, Perform/monitor CIWA, COWS, AIMS and Fall Risk screenings as ordered, Perform wound care treatments as ordered.  Evaluation of Outcomes: Not Progressing   LCSW Treatment Plan for Primary Diagnosis: Generalized anxiety disorder Long Term Goal(s): Safe transition to appropriate next level of care at discharge, Engage patient in therapeutic group addressing interpersonal concerns.  Short Term Goals: Engage patient in aftercare planning with referrals and resources, Increase social support, Increase ability to appropriately verbalize feelings, Increase emotional regulation, Facilitate acceptance of mental health diagnosis and concerns, Facilitate patient progression through stages of change regarding substance use diagnoses and concerns, Identify triggers associated with mental health/substance abuse issues, and Increase skills for wellness and recovery  Therapeutic Interventions: Assess for all discharge needs, 1 to 1 time with Social worker, Explore available resources and support systems, Assess for adequacy in community support network, Educate family and significant other(s) on suicide prevention, Complete Psychosocial Assessment, Interpersonal group therapy.  Evaluation of Outcomes: Not Progressing   Progress in Treatment: Attending groups:  Yes. Participating in groups: Yes. Taking medication as prescribed: Yes. Toleration medication: Yes. Family/Significant other contact made: No, will contact:  consents pending Patient understands diagnosis: Yes. Discussing patient identified problems/goals with staff: Yes. Medical problems stabilized or resolved: Yes. Denies suicidal/homicidal ideation: Yes. Issues/concerns per patient self-inventory: No.  New problem(s) identified: No, Describe:  none reported  New Short Term/Long Term Goal(s): medication stabilization, elimination of SI thoughts, development of comprehensive mental wellness plan.    Patient Goals:  Work on my feelings of guilt and shame with the [facility]  Discharge Plan or Barriers: Patient recently admitted. CSW will continue to follow and assess for appropriate referrals and possible discharge planning.    Reason for Continuation of Hospitalization: Anxiety Medication stabilization  Estimated Length of Stay: 5-7 days  Last 3 [place] Suicide Severity Risk Score: Flowsheet Row [facility] (Current[facility] Most recent reading at 11/05/2023 12:00 AM ED from 11/04/2023 in [facility] Most recent reading at 11/04/2023  1:30 PM CT Bone Marrow Biopsy from 10/10/2022 in [facility] Most recent reading at 10/10/2022  7:47 AM  C-SSRS RISK CATEGORY No Risk No Risk No Risk    Last PHQ 2/9 Scores:    07/17/2021   10:30 AM  Depression screen PHQ 2/9  Decreased Interest 0  Down, Depressed, Hopeless 0  PHQ - 2 Score 0  Altered sleeping 0  Tired, decreased energy 0  Change in appetite 0  Feeling bad or failure about yourself  0  Trouble concentrating 0  Moving slowly or fidgety/restless 0  Suicidal thoughts 0  PHQ-9 Score 0    Scribe for Treatment Team: Jenkins LULLA Primer, LCSWA 11/05/2023 1:16 PM

## 2023-11-05 NOTE — Group Note (Signed)
 Recreation Therapy Group Note   Group Topic:Team Building  Group Date: 11/05/2023 Start Time: 0930 End Time: 1000 Facilitators: Bader Stubblefield-McCall, LRT,CTRS Location: 300 Hall Dayroom   Group Topic: Communication, Team Building, Problem Solving  Goal Area(s) Addresses:  Patient will effectively work with peer towards shared goal.  Patient will identify skills used to make activity successful.  Patient will share challenges and verbalize solution-driven approaches used. Patient will identify how skills used during activity can be used to reach post d/c goals.   Behavioral Response:   Intervention: STEM Activity   Activity: [employer]. Patients were provided the following materials: 5 drinking straws, 5 rubber bands, 5 paper clips, 2 index cards, 2 drinking cups, and 2 toilet paper rolls. Using the provided materials patients were asked to build a launching mechanism to launch a ping pong ball across the room, approximately 10 feet. Patients were divided into teams of 3-5. Instructions required all materials be incorporated into the device, functionality of items left to the peer group's discretion.  Education: Pharmacist, community, Scientist, physiological, Air cabin crew, Building control surveyor.   Education Outcome: Acknowledges education/In group clarification offered/Needs additional education.    Affect/Mood: N/A   Participation Level: Did not attend    Clinical Observations/Individualized Feedback:      Plan: Continue to engage patient in RT group sessions 2-3x/week.   Hue Frick-McCall, LRT,CTRS 11/05/2023 12:01 PM

## 2023-11-05 NOTE — [facility] Suicide Risk Assessment (Signed)
 Suicide Risk Assessment  Admission Assessment    [facility] Admission Suicide Risk Assessment   Nursing information obtained from:  Patient Demographic factors:  NA Current Mental Status:  Self-harm thoughts Loss Factors:  Decline in physical health Historical Factors:  Prior suicide attempts Risk Reduction Factors:  Religious beliefs about death, Employed, Living with another person, especially a relative  Total Time spent with patient: 45 minutes Principal Problem: Generalized anxiety disorder Diagnosis:  Principal Problem:   Generalized anxiety disorder  Subjective Data: See H&P   Continued Clinical Symptoms:  Alcohol Use Disorder Identification Test Final Score (AUDIT): 0 The Alcohol Use Disorders Identification Test, Guidelines for Use in Primary Care, Second Edition.  World Science writer [facility]). Score between 0-7:  no or low risk or alcohol related problems. Score between 8-15:  moderate risk of alcohol related problems. Score between 16-19:  high risk of alcohol related problems. Score 20 or above:  warrants further diagnostic evaluation for alcohol dependence and treatment.   CLINICAL FACTORS:   Bipolar Disorder:   Mixed State Depression:   Anhedonia Impulsivity Insomnia Severe More than one psychiatric diagnosis Previous Psychiatric Diagnoses and Treatments   Musculoskeletal: Strength & Muscle Tone: within normal limits Gait & Station: normal Patient leans: N/A  Psychiatric Specialty Exam:  Presentation  General Appearance:  Appropriate for Environment  Eye Contact: Good  Speech: Clear and Coherent; Normal Rate  Speech Volume: Normal  Handedness: Right   Mood and Affect  Mood: Euthymic  Affect: Congruent   Thought Process  Thought Processes: Coherent; Goal Directed  Descriptions of Associations:Intact  Orientation:Full (Time, Place and Person)  Thought Content:Logical  History of Schizophrenia/Schizoaffective disorder:No data  recorded Duration of Psychotic Symptoms:No data recorded Hallucinations:Hallucinations: None  Ideas of Reference:None  Suicidal Thoughts:Suicidal Thoughts: No  Homicidal Thoughts:Homicidal Thoughts: No   Sensorium  Memory: Immediate Good; Recent Good; Remote Good  Judgment: Fair  Insight: Fair   Art therapist  Concentration: Good  Attention Span: Fair  Recall: Good  Fund of Knowledge: Good  Language:Good   Psychomotor Activity  Psychomotor Activity: Psychomotor Activity: Normal   Assets  Assets: Manufacturing systems engineer; Desire for Improvement; Housing; Resilience; Social Support; Talents/Skills   Sleep  Sleep: Sleep: Poor Number of Hours of Sleep: -1    Physical Exam: Physical Exam ROS Blood pressure 111/86, pulse 82, temperature 98.2 F (36.8 C), temperature source Oral, resp. rate 16, height 5' 6 (1.676 m), weight 79.7 kg, SpO2 100%. Body mass index is 28.36 kg/m.   COGNITIVE FEATURES THAT CONTRIBUTE TO RISK:  None    SUICIDE RISK:   Minimal: No identifiable suicidal ideation.  Patients presenting with no risk factors but with morbid ruminations; may be classified as minimal risk based on the severity of the depressive symptoms  PLAN OF CARE: See H&P   I certify that inpatient services furnished can reasonably be expected to improve the patient's condition.   Blair Chiquita Hint, NP 11/05/2023, 4:03 PM

## 2023-11-05 NOTE — Group Note (Signed)
 Date:  11/05/2023 Time:  9:39 AM  Group Topic/Focus:  Goals Group:   The focus of this group is to help patients establish daily goals to achieve during treatment and discuss how the patient can incorporate goal setting into their daily lives to aide in recovery.    Participation Level:  Active  Participation Quality:  Appropriate  Affect:  Appropriate  Cognitive:  Alert  Insight: Appropriate  Engagement in Group:  Engaged  Modes of Intervention:  Orientation  Additional Comments:  She came yesterday, she is still trying to figure things out. She knows she wants to get back to her normal self although its gonna take time. She also wants to get right with god.  Casey Lang 11/05/2023, 9:39 AM
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
## 2023-11-05 NOTE — Group Note (Signed)
 Date:  11/05/2023 Time:  4:33 PM  Group Topic/Focus:  Dimensions of Wellness: The focus of this group is to introduce the topic of wellness and discuss the role each dimension of wellness plays in total health.    Participation Level:  Active  Participation Quality:  Appropriate  Affect:  Appropriate  Cognitive:  Appropriate  Insight: Appropriate  Engagement in Group:  Engaged  Modes of Intervention:  Discussion    Shanda JONETTA Challenger 11/05/2023, 4:33 PM

## 2023-11-05 NOTE — [facility] Group Notes (Signed)
 Psychoeducational Group Note  Date:  11/05/2023 Time:  2133  Group Topic/Focus:  Recovery Goals:   The focus of this group is to identify appropriate goals for recovery and establish a plan to achieve them.  Participation Level: Did Not Attend  Participation Quality:  Not Applicable  Affect:  Not Applicable  Cognitive:  Not Applicable  Insight:  Not Applicable  Engagement in Group: Not Applicable  Additional Comments:  The patient did not attend group this evening.   Hairo Garraway S 11/05/2023, 9:33 PM

## 2023-11-06 DIAGNOSIS — E559 Vitamin D deficiency, unspecified: Secondary | ICD-10-CM | POA: Insufficient documentation

## 2023-11-06 DIAGNOSIS — G4700 Insomnia, unspecified: Secondary | ICD-10-CM | POA: Insufficient documentation

## 2023-11-06 LAB — GLUCOSE, CAPILLARY
Glucose-Capillary: 66 mg/dL — ABNORMAL LOW (ref 70–99)
Glucose-Capillary: 94 mg/dL (ref 70–99)

## 2023-11-06 MED ORDER — VITAMIN D 25 MCG (1000 UNIT) PO TABS
5000.0000 [IU] | ORAL_TABLET | ORAL | Status: DC
  Administered 2023-11-06: 5000 [IU] via ORAL
  Filled 2023-11-06: qty 5

## 2023-11-06 MED ORDER — PALIPERIDONE ER 3 MG PO TB24
3.0000 mg | ORAL_TABLET | Freq: Every day | ORAL | Status: DC
  Administered 2023-11-06: 3 mg via ORAL
  Filled 2023-11-06: qty 1

## 2023-11-06 MED ORDER — QUETIAPINE FUMARATE 25 MG PO TABS
25.0000 mg | ORAL_TABLET | Freq: Every day | ORAL | Status: DC
  Administered 2023-11-06: 25 mg via ORAL
  Filled 2023-11-06: qty 1

## 2023-11-06 NOTE — Group Note (Signed)
 LCSW Group Therapy Note   Group Date: 11/06/2023 Start Time: 1100 End Time: 1200   Participation:  did not attend  Type of Therapy:  Group Therapy  Topic:  Healing Flames: Navigating Anger with Compassion  Objective:  Foster self-awareness and promote compassion toward oneself and others when dealing with anger.  Goals:  Help participants understand the underlying emotions and needs fueling anger. Provide coping strategies for healthier emotional expression and anger management.  Summary: This session explored anger as a volcano - an explosion driven by deeper feelings and unmet needs. Participants learned to identify anger triggers and underlying emotions, then practiced coping strategies like deep breathing, physical activity, and journaling. The group discussed healthy ways to manage anger before it escalates, using both personal reflection and shared experiences.  Therapeutic Modalities: Cognitive Behavioral Therapy (CBT): Challenging thoughts that fuel anger. Mindfulness: Increasing awareness of emotions and sensations.   Maurico Perrell O Makalyn Lennox, LCSWA 11/06/2023  12:48 PM
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
## 2023-11-06 NOTE — Progress Notes (Signed)
 [facility] Progress Note  Date: 11/06/23 Patient: Casey Lang MRN: [id number]  Assessment and Plan: Casey Lang is a 43 y.o. female admitted due to worsening depressive and anxiety symptoms and hyperreligious thought context of self-tapering her psychiatric medications.   9/25 - Patient appears to still be experiencing some mania/psychosis and seems at least mildly disorganized. Will attempt to start paliperidone , which may facilitate LAI and improved adherence after discharge. She is still voluntary at this time.  # Bipolar 1 disorder, mixed, moderate (HCC) - Quetiapine  25 mg qhs - paliperidone  3 mg qhs   Risk Assessment - Moderate  Discharge Planning Barriers to discharge: psychosis Estimated length of stay: 3-5 days Predicted Discharge location: ??     Interval History and update: Chart reviewed. No significant events overnight. This morning patient approached the nursing station and made a request to her nurse and was told that she had to wait a short while. This appeared to have precipitated an episode of PNES in which she was visible shaking. She initially requested to be transferred to the ED to receive IV medication to sedate her, then requested PO haloperidol . She received this with benadryl  and lorazepam  and calmed down and took a nap in her room. On assessment she reports that she was feeling anxious and was going to request hydroxyzine  but the nurse was not available at that time. She reports feeling a pressure in her head. She said that that sometimes it will resolve when I have deliverance. She also reported not sleeping well last night and that I had some weird sensations, and a weird sexual dream. She stated that she would like to take a lower dose of quetiapine  because she thinks the 50 mg dose is too sedating.      Physical Exam MSK/Neuro - Normal gait and station Mental Status Exam Appearance - disheveled   Attitude - calm Speech - normal volume, prosody, inflection Mood - None given Affect - Blunted Thought Process - GD, illogical Thought Content - endorses spiritual experiences and feeling of deliverance SI/HI - Does not endorse Perceptions - not RIS Judgement/Insight - poor Fund of knowledge - WNL Language - No impairments      Lab Results:  Admission on 11/04/2023  Component Date Value Ref Range Status   Glucose-Capillary 11/06/2023 66 (L)  70 - 99 mg/dL Final   Glucose-Capillary 11/06/2023 94  70 - 99 mg/dL Final     Vitals: Blood pressure (!) 139/104, pulse 95, temperature (!) 97.5 F (36.4 C), temperature source Axillary, resp. rate (!) 28, height 5' 6 (1.676 m), weight 79.7 kg, SpO2 100%.    Oliva DELENA Salmon, DO

## 2023-11-06 NOTE — Group Note (Signed)
 Recreation Therapy Group Note   Group Topic:Other  Group Date: 11/06/2023 Start Time: 1307 End Time: 1346 Facilitators: Arlett Goold-McCall, LRT,CTRS Location: [address] Hall Dayroom   Activity Description/Intervention: Therapeutic Drumming. Patients with peers and staff were given the opportunity to engage in a leader facilitated [facility] Group Empowerment Drumming Circle with staff from the [employer], in partnership with [employer]. Teaching laboratory technician and trained [employer], Norleen Mon leading with LRT observing and documenting intervention and pt response. This evidenced-based practice targets 7 areas of health and wellbeing in the human experience including: stress-reduction, exercise, self-expression, camaraderie/support, nurturing, spirituality, and music-making (leisure).   Goal Area(s) Addresses:  Patient will engage in pro-social way in music group.  Patient will follow directions of drum leader on the first prompt. Patient will demonstrate no behavioral issues during group.  Patient will identify if a reduction in stress level occurs as a result of participation in therapeutic drum circle.    Education: Leisure exposure, Pharmacologist, Musical expression, Discharge Planning   Affect/Mood: N/A   Participation Level: Did not attend    Clinical Observations/Individualized Feedback:      Plan: Continue to engage patient in RT group sessions 2-3x/week.   Rachard Isidro-McCall, LRT,CTRS 11/06/2023 2:18 PM

## 2023-11-06 NOTE — Plan of Care (Signed)
   Problem: Education: Goal: Knowledge of [facility] Education information/materials will improve Outcome: Progressing Goal: Emotional status will improve Outcome: Progressing Goal: Mental status will improve Outcome: Progressing

## 2023-11-06 NOTE — BHH Group Notes (Signed)
 Adult Psychoeducational Group Note  Date:  11/06/2023 Time:  8:59 PM  Group Topic/Focus:  Wrap-Up Group:   The focus of this group is to help patients review their daily goal of treatment and discuss progress on daily workbooks.  Participation Level:  Active  Participation Quality:  Appropriate  Affect:  Appropriate  Cognitive:  Appropriate  Insight: Appropriate  Engagement in Group:  Engaged  Modes of Intervention:  Discussion and Support  Additional Comments:  Pt told that today was an okay day on the unit, the highlight of which was a visit from her Mother, whom she considers supportive. On the subject of ways to stay well upon discharge, Pt mentioned wanting to see a therapist on a regular basis. Pt rated their day a 3 out of 10.  Avrohom Mckelvin Lee 11/06/2023, 8:59 PM

## 2023-11-06 NOTE — Group Note (Signed)
 Date:  11/06/2023 Time:  9:25 AM  Group Topic/Focus:  Goals Group:   The focus of this group is to help patients establish daily goals to achieve during treatment and discuss how the patient can incorporate goal setting into their daily lives to aide in recovery.    Participation Level:  Did Not Attend  Participation Quality:  na  Affect:  na  Cognitive:  na  Insight: None  Engagement in Group:  na  Modes of Intervention:  na  Additional Comments:  na  Nat Rummer 11/06/2023, 9:25 AM

## 2023-11-06 NOTE — Progress Notes (Signed)
 Tour of Duty:  Prentice JINNY Angle, RN, 11/06/23, Tour of Duty: 0700-1900  SI/HI/AVH: Denies  Self-Reported   Mood: Negative  Anxiety: Endorses Depression: Denies Irritability: Denies, but Observable  Broset  Violence Prevention Guidelines *See Row Information*: Small Violence Risk interventions implemented   LBM  Last BM Date : 11/05/23   Pain: not present  Patient Refusals (including Rx): No  Shift Summary: Patient observed to be anxious on unit. Patient able to make needs known. Patient extremely preoccupied with discharge today. 72 hour letter offered to patient, patient declined to sign, seemingly for concern of increasing her length of stay. Patient observed to engage inappropriately with staff and peers. Patient taking medications as prescribed. This shift, PRN medication requested and required. No observed or reported side effects to medication. 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 Trembling, unsteady on feet but did not collapse toll chair brought. Patient maintained speech throughout event. Patient requested to go to the main hospital to get an IV to knock me out. Vitals were mostly unremarkable. CBG 66, Gatorade offered, recheck normative. LP aware. No observed or reported agitation, aggression, or other acute emotional distress. No observed or reported physical abnormalities or concerns.  Last Vitals  Vitals Weight: 79.7 kg Temp: (!) 97.5 F (36.4 C) Temp Source: Axillary Pulse Rate: 79 Resp: (!) 28 BP: 115/87 Patient Position: (not recorded)  Admission Type  Psych Admission Type (Psych Patients Only) Admission Status: Voluntary Date 72 hour document signed : (not recorded) Time 72 hour document signed : (not recorded) Provider Notified (First and Last Name) (see details for LINK to note): (not recorded)   Psychosocial Assessment  Psychosocial Assessment Patient Complaints: Anxiety Eye Contact:  Avertive Facial Expression: Anxious, Sad Affect: Depressed Speech: Logical/coherent Interaction: Cautious, Manipulative Motor Activity: Other (Comment) (PNES) Appearance/Hygiene: Unremarkable Behavior Characteristics: Anxious, Impulsive Mood: Anxious, Labile, Preoccupied   Aggressive Behavior  Targets: (not recorded)   Thought Process  Thought Process Coherency: Within Defined Limits Content: Within Defined Limits Delusions: None reported or observed Perception: Within Defined Limits Hallucination: None reported or observed Judgment: Impaired Confusion: None  Danger to Self/Others  Danger to Self Current suicidal ideation?: Denies Description of Suicide Plan: (not recorded) Self-Injurious Behavior: (not recorded) Agreement Not to Harm Self: (not recorded) Description of Agreement: (not recorded) Danger to Others: None reported or observed

## 2023-11-06 NOTE — Progress Notes (Signed)
(  Sleep Hours) -8.0 (Any PRNs that were needed, meds refused, or side effects to meds)- prnhydroxyzine @ 2054 (Any disturbances and when (visitation, over night)-none (Concerns raised by the patient)- pt expressed - wondering if it was a mistake coming here. This Clinical research associate encouraged patient, she did the right thing by coming here d/t her needing help (SI/HI/AVH)- denies all

## 2023-11-06 NOTE — Plan of Care (Signed)
   Problem: Education: Goal: Emotional status will improve Outcome: Not Progressing Goal: Mental status will improve Outcome: Not Progressing

## 2023-11-07 DIAGNOSIS — F3162 Bipolar disorder, current episode mixed, moderate: Principal | ICD-10-CM

## 2023-11-07 MED ORDER — TRAZODONE HCL 50 MG PO TABS: 25.0000 mg | ORAL_TABLET | Freq: Every evening | ORAL | 0 refills | Status: AC | PRN

## 2023-11-07 MED ORDER — PALIPERIDONE ER 3 MG PO TB24: 3.0000 mg | ORAL_TABLET | Freq: Every day | ORAL | 0 refills | Status: DC

## 2023-11-07 MED ORDER — HYDROXYZINE HCL 25 MG PO TABS: 25.0000 mg | ORAL_TABLET | Freq: Three times a day (TID) | ORAL | 0 refills | Status: DC | PRN

## 2023-11-07 MED ORDER — QUETIAPINE FUMARATE 25 MG PO TABS: 25.0000 mg | ORAL_TABLET | Freq: Every day | ORAL | 0 refills | Status: DC

## 2023-11-07 NOTE — BHH Suicide Risk Assessment (Signed)
 [facility] Discharge Suicide Risk Assessment   Principal Problem: Bipolar 1 disorder, mixed, moderate (HCC) Discharge Diagnoses: Principal Problem:   Bipolar 1 disorder, mixed, moderate (HCC) Active Problems:   Generalized anxiety disorder   Insomnia   Vitamin D  deficiency   Demographic Factors:  Low socioeconomic status  Loss Factors: NA  Historical Factors: Prior suicide attempts and Impulsivity  Risk Reduction Factors:   Sense of responsibility to family and Positive social support  Continued Clinical Symptoms:  Bipolar disorder with active symptoms  Cognitive Features That Contribute To Risk:  None    Suicide Risk:  Mild:  Suicidal ideation of limited frequency, intensity, duration, and specificity.  There are no identifiable plans, no associated intent, mild dysphoria and related symptoms, good self-control (both objective and subjective assessment), few other risk factors, and identifiable protective factors, including available and accessible social support.   Follow-up Information     [facility] Follow up.   Why: You may also call this provider to schedule an appointment for therapy services. Contact information: [address] [place], [place] [address] Phone: [phone]        [facility]. Go on 11/13/2023.   Why: Please go to this provider on 11/13/23 at 8:30 am for an assessment, to obtain group therapy services.  They are open 24/7. Contact information: [address] [place] [address] [phone]         [facility] Follow up on 11/10/2023.   Why: Please call on 11/10/23 at 9:00 am to schedule an appointment with Dr. Rockey Law for medication management services, as we were unable to contact prior to discharge. Contact information: [address] [place] [address] [phone]                  Oliva DELENA Salmon, DO 11/07/2023, 9:45 AM

## 2023-11-07 NOTE — BHH Suicide Risk Assessment (Signed)
 B[facility] INPATIENT:  Family/Significant Other Suicide Prevention Education  Suicide Prevention Education:  Contact Attempts: Janina (mother) [phone], (name of family member/significant other) has been identified by the patient as the family member/significant other with whom the patient will be residing, and identified as the person(s) who will aid the patient in the event of a mental health crisis.  With written consent from the patient, two attempts were made to provide suicide prevention education, prior to and/or following the patient's discharge.  We were unsuccessful in providing suicide prevention education.  A suicide education pamphlet was given to the patient to share with family/significant other.  Date and time of first attempt:11/07/23/9:30am Date and time of second attempt:11/07/23/10:00am  Honor Fairbank M Jawanda Passey, LCSWA 11/07/2023, 10:04 AM

## 2023-11-07 NOTE — Group Note (Signed)
 Date:  11/07/2023 Time:  9:05 AM  Group Topic/Focus:  Goals Group:   The focus of this group is to help patients establish daily goals to achieve during treatment and discuss how the patient can incorporate goal setting into their daily lives to aide in recovery. Patients were asked two questions: What is something you can do today to take care of your mental health? What is 1 thing you're proud of doing lately?    Participation Level:  Active  Participation Quality:  Appropriate and Sharing  Affect:  Appropriate  Cognitive:  Alert and Appropriate  Insight: Appropriate and Improving  Engagement in Group:  Engaged and Improving  Modes of Intervention:  Discussion, Socialization, and Support  Additional Comments:  Patient attended and participated in goals group. Brandilee answered these questions on a piece of paper: Pray and stay focused on staying well and she is proud of resting.  Kristi HERO Bennet Kujawa 11/07/2023, 9:05 AM

## 2023-11-07 NOTE — Progress Notes (Signed)
(  Sleep Hours) -7.75 (Any PRNs that were needed, meds refused, or side effects to meds)-none  (Any disturbances and when (visitation, over night)-none (Concerns raised by the patient)- Patient does not like to attend groups is someone is cursing (SI/HI/AVH)- denies all

## 2023-11-07 NOTE — Progress Notes (Signed)
  [facility] Adult Case Management Discharge Plan :  Will you be returning to the same living situation after discharge:  Yes,  pt returning home At discharge, do you have transportation home?: Yes,  pt's mother to provide transportation Do you have the ability to pay for your medications: Yes,  pt is employed and insured - [employer] of information consent forms completed and in the chart;  Patient's signature needed at discharge.  Patient to Follow up at:  Follow-up Information     [facility] Follow up.   Why: You may also call this provider to schedule an appointment for therapy services. Contact information: [address] [place], [place] [address] Phone: [phone]        [facility]. Go on 11/13/2023.   Why: Please go to this provider on 11/13/23 at 8:30 am for an assessment, to obtain group therapy services.  They are open 24/7. Contact information: [address] [place] [address] [phone]         [facility] Follow up on 11/10/2023.   Why: Please call on 11/10/23 at 9:00 am to schedule an appointment with Dr. Rockey Law for medication management services, as we were unable to contact prior to discharge. Contact information: [address] [place] [address] [phone]                 Next level of care provider has access to [facility] Link:no  Safety Planning and Suicide Prevention discussed: Yes,  completed with patient as we were unsuccessful in contacting family/friends.     Has patient been referred to the Quitline?: Patient does not use tobacco/nicotine products  Patient has been referred for addiction treatment: No known substance use disorder.  Eustolia Drennen M Tarhonda Hollenberg, LCSWA 11/07/2023, 10:04 AM

## 2023-11-07 NOTE — Progress Notes (Signed)
 Patient verbalizes readiness for discharge. All patient belongings returned to patient. Discharge instructions read and discussed with patient (appointments, medications, resources). Patient expressed gratitude for care provided. Patient discharged to lobby at 1155 where her mother was waiting.

## 2023-11-08 NOTE — Discharge Summary (Signed)
 Physician Discharge Summary Note  Patient:  Casey Lang is an 43 y.o., female MRN:  [id number] DOB:  [birth year] Patient phone:  [phone] (home)  Patient address:   [address] [place] [address],  Total Time spent with patient: 45 minutes  Date of Admission:  11/04/2023 Date of Discharge: 11/07/2023  Reason for Admission:  The patient presented voluntarily to [facility], accompanied by her mother, due to worsening depressive and anxiety symptoms, insomnia, and hyperreligious thought content after self-tapering off her psychiatric medications. She reported significant sleep disturbance, anxiety, and a sense of spiritual "deliverance" leading to medication discontinuation. She was admitted for further evaluation and stabilization.    Principal Problem: Bipolar 1 disorder, mixed, moderate (HCC) Discharge Diagnoses: Principal Problem:   Bipolar 1 disorder, mixed, moderate (HCC) Active Problems:   Generalized anxiety disorder   Insomnia   Vitamin D  deficiency   Past Psychiatric History:  Previous Psych Diagnoses: Bipolar disorder (with prior psychotic features), generalized anxiety disorder, insomnia, major depression, and PTSD.  SA/HA: Suicide attempt in her 21s by overdose (intervened by her mother), and a history of superficial cutting in adolescence.  Prior Inpatient Treatment: [facility] 2008, 2014, January and February 2017. Past Medication Trials: Seroquel  25 mg (last taken May 2025), Klonopin  (last taken May 2025), melatonin, Cymbalta , Depakote, and gabapentin .  Psychotherapy Tx: 2010-2016 with Consuelo Berber. 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   Depression    Leukopenia  10/19/2021   Mental disorder    PTSD (post-traumatic stress disorder)    Right foot injury 10/09/2012   Resolved without complication    Past Surgical History:  Procedure Laterality Date   FOOT SURGERY     HEMATOMA EVACUATION N/A 07/18/2016   Procedure: EVACUATION HEMATOMA;  Surgeon: Fredirick Glenys RAMAN, MD;  Location: [facility];  Service: Gynecology;  Laterality: N/A;   LAPAROSCOPIC GASTRIC SLEEVE RESECTION  10/09/2012   Surgery was on 09/30/2011   LESION EXCISION WITH COMPLEX REPAIR Bilateral 03/08/2022   Procedure: LESION EXCISION WITH COMPLEX REPAIR > 20cm;  Surgeon: Arelia Filippo, MD;  Location: [facility];  Service: Plastics;  Laterality: Bilateral;   MASTECTOMY W/ SENTINEL NODE BIOPSY Left 11/29/2021   Procedure: LEFT MASTECTOMY WITH SENTINEL LYMPH NODE BIOPSY;  Surgeon: Curvin Deward MOULD, MD;  Location: [facility];  Service: General;  Laterality: Left;   TOTAL MASTECTOMY Right 11/29/2021   Procedure: RIGHT TOTAL MASTECTOMY;  Surgeon: Curvin Deward MOULD, MD;  Location: [facility];  Service: General;  Laterality: Right;   TUBAL LIGATION Bilateral 07/18/2016   Procedure: POST PARTUM TUBAL LIGATION;  Surgeon: Fredirick Glenys RAMAN, MD;  Location: [facility];  Service: Gynecology;  Laterality: Bilateral;   Hospital Course:  The patient was admitted voluntarily to the inpatient psychiatric unit. Upon admission she presented as disheveled but calm, with a blunted affect and illogical thought processes. There was no evidence of current substance use, and her urine drug screen was negative.  Her medication regimen was reinitiated and titrated to address her symptoms. Quetiapine  was increased to 50 mg nightly for mood stabilization and sleep, and paliperidone  3 mg nightly was started to target ongoing psychotic features and to facilitate  a potential transition to a long-acting injectable formulation for improved adherence. Throughout the hospitalization  she remained free of suicidal or homicidal ideation and was able to articulate her safety plan and the importance of medication adherence.  By the time of discharge she was stable on her medication regimen, denied SI/HI, and was able to return home to live with her son, with her mother providing support and transportation. Outpatient follow-up was arranged with her psychiatrist, Dr. Rockey Law, for medication management, as well as with [facility] for therapy and [facility] for group therapy and assessment. She expressed understanding of her discharge plan and demonstrated readiness to transition back to the community with appropriate supports in place.   Musculoskeletal: Normal gait and station   Psychiatric Specialty Exam:  Presentation  General Appearance:  Appropriate for Environment  Eye Contact: Good  Speech: Clear and Coherent; Normal Rate  Speech Volume: Normal  Handedness: Right   Mood and Affect  Mood: Euthymic  Affect: Congruent   Thought Process  Thought Processes: Coherent; Goal Directed  Descriptions of Associations:Intact  Orientation:Full (Time, Place and Person)  Thought Content:Logical  HIdeas of Reference:None  Suicidal Thoughts: Denied Homicidal Thoughts: Denied  Sensorium  Memory: Immediate Good; Recent Good; Remote Good  Judgment: Fair  Insight: Fair   Executive Functions  Concentration: Good  Attention Span: Fair  Recall: Good  Fund of Knowledge: Good  Language: Good   Physical Exam Vitals and nursing note reviewed.  HENT:     Head: Normocephalic and atraumatic.  Eyes:     Extraocular Movements: Extraocular movements intact.  Pulmonary:     Effort: Pulmonary effort is normal.  Musculoskeletal:        General: Normal range of motion.  Skin:    General: Skin is warm and dry.  Neurological:     Mental Status: She is oriented to person, place, and time. Mental status is  at baseline.    Review of Systems  Constitutional: Negative.   Respiratory: Negative.    Cardiovascular: Negative.    Blood pressure 107/83, pulse 82, temperature 98.3 F (36.8 C), temperature source Oral, resp. rate 16, height 5' 6 (1.676 m), weight 79.7 kg, SpO2 100%. Body mass index is 28.36 kg/m.   Social History   Tobacco Use  Smoking Status Never  Smokeless Tobacco Never   Tobacco Cessation:  N/A, patient does not currently use tobacco products   Blood Alcohol level:  Lab Results  Component Value Date   [facility] <15 11/04/2023   ETH <5 02/28/2015    Metabolic Disorder Labs:  Lab Results  Component Value Date   HGBA1C 4.4 (L) 11/04/2023   MPG 79.58 11/04/2023   MPG 100 03/01/2015   Lab Results  Component Value Date   PROLACTIN 9.7 11/04/2023   PROLACTIN 45.6 (H) 03/01/2015   Lab Results  Component Value Date   CHOL 188 11/04/2023   TRIG 118 11/04/2023   HDL 47 11/04/2023   CHOLHDL 4.0 11/04/2023   VLDL 24 11/04/2023   LDLCALC 117 (H) 11/04/2023   LDLCALC 90 03/01/2015    See Psychiatric Specialty Exam and Suicide Risk Assessment completed by Attending Physician prior to discharge.  Discharge destination:  Home  Is patient on multiple antipsychotic therapies at discharge:  Yes, however, quetiapine  is at a dose of 25 mg at bedtime for insomnia. She is effectively on monotherapy from a total dosage perspective between this and paliperidone .   Allergies as of 11/07/2023  Reactions   Morphine And Codeine Other (See Comments)   Delusions, hallucinations, psychosis         Medication List     STOP taking these medications    clonazePAM  1 MG tablet Commonly known as: KLONOPIN        TAKE these medications      Indication  hydrOXYzine  25 MG tablet Commonly known as: ATARAX  Take 1 tablet (25 mg total) by mouth 3 (three) times daily as needed for anxiety.  Indication: Feeling Anxious   paliperidone  3 MG 24 hr tablet Commonly known as:  INVEGA  Take 1 tablet (3 mg total) by mouth at bedtime.  Indication: Bipolar affective disorder   QUEtiapine  25 MG tablet Commonly known as: SEROQUEL  Take 1 tablet (25 mg total) by mouth at bedtime.  Indication: Manic-Depression, insomnia   traZODone  50 MG tablet Commonly known as: DESYREL  Take 0.5 tablets (25 mg total) by mouth at bedtime as needed for sleep.  Indication: Trouble Sleeping        Follow-up Information     [facility] Follow up.   Why: You may also call this provider to schedule an appointment for therapy services. Contact information: [address] [place], [place] [address] Phone: [phone]        [facility]. Go on 11/13/2023.   Why: Please go to this provider on 11/13/23 at 8:30 am for an assessment, to obtain group therapy services.  They are open 24/7. Contact information: [address] [place] [address] [phone]         [facility] Follow up on 11/10/2023.   Why: Please call on 11/10/23 at 9:00 am to schedule an appointment with Dr. Rockey Law for medication management services, as we were unable to contact prior to discharge. Contact information: [address] [place] [address] [phone]                 Follow-up recommendations:  Activity:  As tolerated Diet:  Regular   Signed: Oliva DELENA Salmon, DO 11/08/2023, 5:24 PM

## 2023-11-22 LAB — COLOGUARD: COLOGUARD: NEGATIVE

## 2023-12-08 ENCOUNTER — Ambulatory Visit: Payer: Self-pay | Attending: General Surgery

## 2023-12-08 VITALS — Wt 182.2 lb

## 2023-12-08 DIAGNOSIS — Z483 Aftercare following surgery for neoplasm: Secondary | ICD-10-CM | POA: Insufficient documentation

## 2023-12-08 NOTE — Therapy (Signed)
 OUTPATIENT PHYSICAL THERAPY SOZO SCREENING NOTE   Patient Name: Casey Lang MRN: [id number] DOB:27-Jul-1980, 43 y.o., female Today's Date: 12/08/2023  PCP: System, Provider Not In REFERRING PROVIDER: Curvin Deward MOULD, MD   PT End of Session - 12/08/23 1632     Visit Number 2   # unchanged due to screen only   PT Start Time 1630    PT Stop Time 1634    PT Time Calculation (min) 4 min    Activity Tolerance Patient tolerated treatment well    Behavior During Therapy [facility] for tasks assessed/performed          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   Depression    Leukopenia 10/19/2021   Mental disorder    PTSD (post-traumatic stress disorder)    Right foot injury 10/09/2012   Resolved without complication   Past Surgical History:  Procedure Laterality Date   FOOT SURGERY     HEMATOMA EVACUATION N/A 07/18/2016   Procedure: EVACUATION HEMATOMA;  Surgeon: Fredirick Glenys RAMAN, MD;  Location: [facility];  Service: Gynecology;  Laterality: N/A;   LAPAROSCOPIC GASTRIC SLEEVE RESECTION  10/09/2012   Surgery was on 09/30/2011   LESION EXCISION WITH COMPLEX REPAIR Bilateral 03/08/2022   Procedure: LESION EXCISION WITH COMPLEX REPAIR > 20cm;  Surgeon: Arelia Filippo, MD;  Location: [facility];  Service: Plastics;  Laterality: Bilateral;   MASTECTOMY W/ SENTINEL NODE BIOPSY Left 11/29/2021   Procedure: LEFT MASTECTOMY WITH SENTINEL LYMPH NODE BIOPSY;  Surgeon: Curvin Deward MOULD, MD;  Location: [facility];  Service: General;  Laterality: Left;   TOTAL MASTECTOMY Right 11/29/2021   Procedure: RIGHT TOTAL MASTECTOMY;  Surgeon: Curvin Deward MOULD, MD;  Location: [facility];  Service: General;  Laterality: Right;   TUBAL LIGATION Bilateral 07/18/2016   Procedure: POST PARTUM TUBAL LIGATION;  Surgeon: Fredirick Glenys RAMAN, MD;   Location: 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   Leukopenia 10/19/2021   Iron deficiency anemia due to chronic blood loss 06/11/2021   Gestational hypertension 07/17/2016   Gestational hypertension, third trimester 05/03/2016   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 It measures 18.6 cm and is located in the upper outer quadrant. It is ER/PR positive. Patient elected for bilateral mastectomies on 11/29/2021. Final pathology right breast benign, left breast two areas high grade DCIS, margins clear, 0/7 SLN. Drains removed 12/24/2021. 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   PAIN:  Are you having pain? No  SOZO SCREENING: Patient was assessed today using the SOZO machine to determine the lymphedema index score. This was compared to her baseline score. It was determined that she  is within the recommended range when compared to her baseline and no further action is needed at this time. She will continue SOZO screenings. These are done every 3 months for 2 years post operatively followed by every 6 months for 2 years, and then annually.    L-DEX FLOWSHEETS - 12/08/23 1600       L-DEX LYMPHEDEMA SCREENING   Measurement Type Unilateral    L-DEX MEASUREMENT EXTREMITY Upper Extremity    POSITION  Standing    DOMINANT SIDE Right    At Risk Side Left    BASELINE SCORE (UNILATERAL) 3.5    L-DEX SCORE (UNILATERAL) -2.1    VALUE CHANGE (UNILAT) -5.6         P: Pt can transition to 6 months now until 11/2025 than annual follow ups.   Aden Berwyn Caldron, PTA 12/08/2023, 4:33 PM

## 2024-01-21 ENCOUNTER — Emergency Department (HOSPITAL_COMMUNITY)
Admission: EM | Admit: 2024-01-21 | Discharge: 2024-01-21 | Disposition: A | Discharge status: Other Acute Inpatient Hospital

## 2024-01-21 ENCOUNTER — Ambulatory Visit (INDEPENDENT_AMBULATORY_CARE_PROVIDER_SITE_OTHER)
Admission: EM | Admit: 2024-01-21 | Discharge: 2024-01-22 | Disposition: A | Discharge status: Other Acute Inpatient Hospital | Source: Intra-hospital | Attending: Psychiatry | Admitting: Psychiatry

## 2024-01-21 ENCOUNTER — Ambulatory Visit (HOSPITAL_COMMUNITY)
Admission: EM | Admit: 2024-01-21 | Discharge: 2024-01-21 | Disposition: A | Discharge status: Other Acute Inpatient Hospital

## 2024-01-21 ENCOUNTER — Other Ambulatory Visit: Payer: Self-pay

## 2024-01-21 ENCOUNTER — Emergency Department (HOSPITAL_COMMUNITY)

## 2024-01-21 DIAGNOSIS — F411 Generalized anxiety disorder: Secondary | ICD-10-CM | POA: Diagnosis not present

## 2024-01-21 DIAGNOSIS — Z79899 Other long term (current) drug therapy: Secondary | ICD-10-CM | POA: Insufficient documentation

## 2024-01-21 DIAGNOSIS — R0789 Other chest pain: Secondary | ICD-10-CM | POA: Insufficient documentation

## 2024-01-21 DIAGNOSIS — F319 Bipolar disorder, unspecified: Secondary | ICD-10-CM

## 2024-01-21 DIAGNOSIS — R079 Chest pain, unspecified: Secondary | ICD-10-CM | POA: Insufficient documentation

## 2024-01-21 DIAGNOSIS — Z91148 Patient's other noncompliance with medication regimen for other reason: Secondary | ICD-10-CM

## 2024-01-21 DIAGNOSIS — F419 Anxiety disorder, unspecified: Secondary | ICD-10-CM | POA: Diagnosis present

## 2024-01-21 DIAGNOSIS — F3181 Bipolar II disorder: Secondary | ICD-10-CM | POA: Diagnosis present

## 2024-01-21 LAB — CBC WITH DIFFERENTIAL/PLATELET
Abs Immature Granulocytes: 0.01 K/uL (ref 0.00–0.07)
Basophils Absolute: 0 K/uL (ref 0.0–0.1)
Basophils Relative: 1 %
Eosinophils Absolute: 0 K/uL (ref 0.0–0.5)
Eosinophils Relative: 1 %
HCT: 38.7 % (ref 36.0–46.0)
Hemoglobin: 12.6 g/dL (ref 12.0–15.0)
Immature Granulocytes: 0 %
Lymphocytes Relative: 41 %
Lymphs Abs: 1.4 K/uL (ref 0.7–4.0)
MCH: 30.4 pg (ref 26.0–34.0)
MCHC: 32.6 g/dL (ref 30.0–36.0)
MCV: 93.3 fL (ref 80.0–100.0)
Monocytes Absolute: 0.3 K/uL (ref 0.1–1.0)
Monocytes Relative: 8 %
Neutro Abs: 1.7 K/uL (ref 1.7–7.7)
Neutrophils Relative %: 49 %
Platelets: 295 K/uL (ref 150–400)
RBC: 4.15 MIL/uL (ref 3.87–5.11)
RDW: 13.2 % (ref 11.5–15.5)
WBC: 3.5 K/uL — ABNORMAL LOW (ref 4.0–10.5)
nRBC: 0 % (ref 0.0–0.2)

## 2024-01-21 LAB — BASIC METABOLIC PANEL WITH GFR
Anion gap: 8 (ref 5–15)
BUN: 15 mg/dL (ref 6–20)
CO2: 25 mmol/L (ref 22–32)
Calcium: 8.5 mg/dL — ABNORMAL LOW (ref 8.9–10.3)
Chloride: 105 mmol/L (ref 98–111)
Creatinine, Ser: 0.8 mg/dL (ref 0.44–1.00)
GFR, Estimated: >60 mL/min (ref >60)
Glucose, Bld: 86 mg/dL (ref 70–99)
Potassium: 3.6 mmol/L (ref 3.5–5.1)
Sodium: 138 mmol/L (ref 135–145)

## 2024-01-21 LAB — POCT URINE DRUG SCREEN - MANUAL ENTRY (I-SCREEN)
POC Amphetamine UR: NOT DETECTED
POC Buprenorphine (BUP): NOT DETECTED
POC Cocaine UR: NOT DETECTED
POC Marijuana UR: NOT DETECTED
POC Methadone UR: NOT DETECTED
POC Methamphetamine UR: NOT DETECTED
POC Morphine: NOT DETECTED
POC Oxazepam (BZO): NOT DETECTED
POC Oxycodone UR: NOT DETECTED
POC Secobarbital (BAR): NOT DETECTED

## 2024-01-21 LAB — CBC
HCT: 36.8 % (ref 36.0–46.0)
Hemoglobin: 12.1 g/dL (ref 12.0–15.0)
MCH: 31.2 pg (ref 26.0–34.0)
MCHC: 32.9 g/dL (ref 30.0–36.0)
MCV: 94.8 fL (ref 80.0–100.0)
Platelets: 266 K/uL (ref 150–400)
RBC: 3.88 MIL/uL (ref 3.87–5.11)
RDW: 13.2 % (ref 11.5–15.5)
WBC: 2.9 K/uL — ABNORMAL LOW (ref 4.0–10.5)
nRBC: 0 % (ref 0.0–0.2)

## 2024-01-21 LAB — TROPONIN I (HIGH SENSITIVITY): Troponin I (High Sensitivity): <2 ng/L (ref <18)

## 2024-01-21 LAB — POC URINE PREG, ED: Preg Test, Ur: NEGATIVE

## 2024-01-21 LAB — HCG, SERUM, QUALITATIVE: Preg, Serum: NEGATIVE

## 2024-01-21 MED ORDER — LORAZEPAM 1 MG PO TABS: 0.5000 mg | ORAL_TABLET | Freq: Once | ORAL | Status: DC

## 2024-01-21 MED ORDER — ALUM & MAG HYDROXIDE-SIMETH 200-200-20 MG/5ML PO SUSP: 30.0000 mL | ORAL | Status: DC | PRN

## 2024-01-21 MED ORDER — DIPHENHYDRAMINE HCL 50 MG/ML IJ SOLN: 50.0000 mg | Freq: Three times a day (TID) | INTRAMUSCULAR | Status: DC | PRN

## 2024-01-21 MED ORDER — HALOPERIDOL LACTATE 5 MG/ML IJ SOLN: 10.0000 mg | Freq: Three times a day (TID) | INTRAMUSCULAR | Status: DC | PRN

## 2024-01-21 MED ORDER — TRAZODONE HCL 50 MG PO TABS
25.0000 mg | ORAL_TABLET | Freq: Every evening | ORAL | Status: DC | PRN
  Administered 2024-01-21: 22:00:00 25 mg via ORAL
  Filled 2024-01-21: qty 1

## 2024-01-21 MED ORDER — MAGNESIUM HYDROXIDE 400 MG/5ML PO SUSP: 30.0000 mL | Freq: Every day | ORAL | Status: DC | PRN

## 2024-01-21 MED ORDER — LORAZEPAM 2 MG/ML IJ SOLN: 2.0000 mg | Freq: Three times a day (TID) | INTRAMUSCULAR | Status: DC | PRN

## 2024-01-21 MED ORDER — HALOPERIDOL 5 MG PO TABS: 5.0000 mg | ORAL_TABLET | Freq: Three times a day (TID) | ORAL | Status: DC | PRN

## 2024-01-21 MED ORDER — QUETIAPINE FUMARATE 25 MG PO TABS
25.0000 mg | ORAL_TABLET | Freq: Every day | ORAL | Status: DC
  Administered 2024-01-21: 25 mg via ORAL
  Filled 2024-01-21: qty 1

## 2024-01-21 MED ORDER — PALIPERIDONE ER 3 MG PO TB24
3.0000 mg | ORAL_TABLET | Freq: Every day | ORAL | Status: DC
  Filled 2024-01-21: qty 1

## 2024-01-21 MED ORDER — HALOPERIDOL LACTATE 5 MG/ML IJ SOLN: 5.0000 mg | Freq: Three times a day (TID) | INTRAMUSCULAR | Status: DC | PRN

## 2024-01-21 MED ORDER — DIPHENHYDRAMINE HCL 50 MG PO CAPS: 50.0000 mg | ORAL_CAPSULE | Freq: Three times a day (TID) | ORAL | Status: DC | PRN

## 2024-01-21 MED ORDER — ACETAMINOPHEN 325 MG PO TABS: 650.0000 mg | ORAL_TABLET | Freq: Four times a day (QID) | ORAL | Status: DC | PRN

## 2024-01-21 NOTE — ED Provider Notes (Cosign Needed)
 [facility] Continuous Assessment Admission H&P  Date: 01/21/24 Patient Name: Casey Lang MRN: [id number] Chief Complaint: medication change  Diagnoses:  Final diagnoses:  Bipolar I disorder (HCC)  Hx of medication noncompliance    HPI: Casey Lang, 43 y/o female with a history of bipolar disorder, general anxiety, presented to [facility], after she was seen here today and sent to the ED for medical clearance due to chest pain.  According to the patient her medication is not working for her bipolar disorder.  According to her she does see a provider via Zoom.  Patient stated that she was on Seroquel  800 mg then they taper her down and she is currently at her 100 mg but the medicine she does not believe it is working properly.  When asked if she had tried other medication such as risperidone and patient stated yes but does not remember it being effective.  Patient is also not compliant with her medication regiment most of her medicine that she has she keeps and I do not work so she do not take them.  Writer discussed with patient that we could restart her back or on her Seroquel  100 mg but then she would need to reach out back to her provider to update him on what is going on to see if they can switch her medicine to something else.  Patient currently lives with her mother.  Currently not seeing a therapist.  Does not appear to be in any immediate distress at this present moment.  Face-to-face evaluation of patient, patient alert and oriented x 4, speech is clear, maintain eye contact.  Patient denies SI, HI, AVH or paranoia.  Denies alcohol use denies illicit drug use.  Denies access to guns denied wanting to hurt herself or others.  Patient stated that she lives an hour away from the facility.  And was contemplating going home but she does not have a ride because of the distance.  Writer discussed with patient we can keep her overnight for observation and reassess in the  morning.    Total Time spent with patient: 20 minutes  Musculoskeletal  Strength & Muscle Tone: within normal limits Gait & Station: normal Patient leans: N/A  Psychiatric Specialty Exam  Presentation General Appearance:  Casual  Eye Contact: Good  Speech: Clear and Coherent  Speech Volume: Normal  Handedness: Right   Mood and Affect  Mood: Euthymic  Affect: Congruent   Thought Process  Thought Processes: Coherent  Descriptions of Associations:Intact  Orientation:Full (Time, Place and Person)  Thought Content:Logical    Hallucinations:Hallucinations: None  Ideas of Reference:None  Suicidal Thoughts:Suicidal Thoughts: No  Homicidal Thoughts:Homicidal Thoughts: No   Sensorium  Memory: Immediate Fair  Judgment: Fair  Insight: Fair   Art Therapist  Concentration: Good  Attention Span: Fair  Recall: Fair  Fund of Knowledge: Fair  Language: Fair   Psychomotor Activity  Psychomotor Activity: Psychomotor Activity: Normal   Assets  Assets: Desire for Improvement   Sleep  Sleep: Sleep: Fair Number of Hours of Sleep: 3   Nutritional Assessment (For OBS and FBC admissions only) Has the patient had a weight loss or gain of 10 pounds or more in the last 3 months?: No Has the patient had a decrease in food intake/or appetite?: No Does the patient have dental problems?: No Does the patient have eating habits or behaviors that may be indicators of an eating disorder including binging or inducing vomiting?: No Has the patient recently lost weight without trying?:  0 Has the patient been eating poorly because of a decreased appetite?: 0 Malnutrition Screening Tool Score: 0    Physical Exam HENT:     Head: Normocephalic.     Nose: Nose normal.  Eyes:     Pupils: Pupils are equal, round, and reactive to light.  Cardiovascular:     Rate and Rhythm: Normal rate.  Pulmonary:     Effort: Pulmonary effort is normal.   Musculoskeletal:        General: Normal range of motion.     Cervical back: Normal range of motion.  Neurological:     General: No focal deficit present.     Mental Status: She is alert.  Psychiatric:        Mood and Affect: Mood normal.        Thought Content: Thought content normal.    Review of Systems  Constitutional: Negative.   HENT: Negative.    Eyes: Negative.   Respiratory: Negative.    Cardiovascular: Negative.   Gastrointestinal: Negative.   Genitourinary: Negative.   Musculoskeletal: Negative.   Skin: Negative.   Neurological: Negative.   Psychiatric/Behavioral:  Positive for depression. The patient is nervous/anxious and has insomnia.     Blood pressure 120/68, pulse 69, temperature 98 F (36.7 C), temperature source Oral, resp. rate 19, SpO2 99%. There is no height or weight on file to calculate BMI.  Past Psychiatric History: Bipolar disorder, GAD, medication noncompliant  Is the patient at risk to self? No  Has the patient been a risk to self in the past 6 months? No .    Has the patient been a risk to self within the distant past? No   Is the patient a risk to others? No   Has the patient been a risk to others in the past 6 months? No   Has the patient been a risk to others within the distant past? No   Past Medical History: See chart  Family History: Unknown  Social History: Denies  Last Labs:  Admission on 01/21/2024, Discharged on 01/21/2024  Component Date Value Ref Range Status   Sodium 01/21/2024 138  135 - 145 mmol/L Final   Potassium 01/21/2024 3.6  3.5 - 5.1 mmol/L Final   Chloride 01/21/2024 105  98 - 111 mmol/L Final   CO2 01/21/2024 25  22 - 32 mmol/L Final   Glucose, Bld 01/21/2024 86  70 - 99 mg/dL Final   Glucose reference range applies only to samples taken after fasting for at least 8 hours.   BUN 01/21/2024 15  6 - 20 mg/dL Final   Creatinine, Ser 01/21/2024 0.80  0.44 - 1.00 mg/dL Final   Calcium 87/89/7974 8.5 (L)  8.9 -  10.3 mg/dL Final   GFR, Estimated 01/21/2024 >60  >60 mL/min Final   Comment: (NOTE) Calculated using the CKD-EPI Creatinine Equation (2021)    Anion gap 01/21/2024 8  5 - 15 Final   Performed at [facility], [address], [place], [place] [address]   WBC 01/21/2024 2.9 (L)  4.0 - 10.5 K/uL Final   RBC 01/21/2024 3.88  3.87 - 5.11 MIL/uL Final   Hemoglobin 01/21/2024 12.1  12.0 - 15.0 g/dL Final   HCT 87/89/7974 36.8  36.0 - 46.0 % Final   MCV 01/21/2024 94.8  80.0 - 100.0 fL Final   MCH 01/21/2024 31.2  26.0 - 34.0 pg Final   MCHC 01/21/2024 32.9  30.0 - 36.0 g/dL Final   RDW  01/21/2024 13.2  11.5 - 15.5 % Final   Platelets 01/21/2024 266  150 - 400 K/uL Final   nRBC 01/21/2024 0.0  0.0 - 0.2 % Final   Performed at [facility], [address], [place], [place] [address]   Troponin I (High Sensitivity) 01/21/2024 <2  <18 ng/L Final   Comment: (NOTE) Elevated high sensitivity troponin I (hsTnI) values and significant  changes across serial measurements may suggest ACS but many other  chronic and acute conditions are known to elevate hsTnI results.  Refer to the Links section for chest pain algorithms and additional  guidance. Performed at [facility], [address] [place], [place] [address]    Preg, Serum 01/21/2024 NEGATIVE  NEGATIVE Final   Comment:        THE SENSITIVITY OF THIS METHODOLOGY IS >10 mIU/mL. Performed at [facility], [address], [place], [place] [address]   Admission on 11/04/2023, Discharged on 11/07/2023  Component Date Value Ref Range Status   Glucose-Capillary 11/06/2023 66 (L)  70 - 99 mg/dL Final   Glucose reference range applies only to samples taken after fasting for at least 8 hours.   Glucose-Capillary 11/06/2023 94  70 - 99 mg/dL Final   Glucose reference range applies only to samples taken after fasting for at least 8 hours.  Admission on 11/04/2023, Discharged on 11/04/2023  Component Date Value Ref Range  Status   Sodium 11/04/2023 140  135 - 145 mmol/L Final   Potassium 11/04/2023 3.9  3.5 - 5.1 mmol/L Final   Chloride 11/04/2023 104  98 - 111 mmol/L Final   CO2 11/04/2023 25  22 - 32 mmol/L Final   Glucose, Bld 11/04/2023 83  70 - 99 mg/dL Final   Glucose reference range applies only to samples taken after fasting for at least 8 hours.   BUN 11/04/2023 16  6 - 20 mg/dL Final   Creatinine, Ser 11/04/2023 0.88  0.44 - 1.00 mg/dL Final   Calcium 90/76/7974 9.3  8.9 - 10.3 mg/dL Final   Total Protein 90/76/7974 6.5  6.5 - 8.1 g/dL Final   Albumin 90/76/7974 4.0  3.5 - 5.0 g/dL Final   AST 90/76/7974 12 (L)  15 - 41 U/L Final   ALT 11/04/2023 7  0 - 44 U/L Final   Alkaline Phosphatase 11/04/2023 33 (L)  38 - 126 U/L Final   Total Bilirubin 11/04/2023 1.3 (H)  0.0 - 1.2 mg/dL Final   GFR, Estimated 11/04/2023 >60  >60 mL/min Final   Comment: (NOTE) Calculated using the CKD-EPI Creatinine Equation (2021)    Anion gap 11/04/2023 11  5 - 15 Final   Performed at [facility], [address] [place], [place] [address]   WBC 11/04/2023 2.8 (L)  4.0 - 10.5 K/uL Final   RBC 11/04/2023 4.20  3.87 - 5.11 MIL/uL Final   Hemoglobin 11/04/2023 12.8  12.0 - 15.0 g/dL Final   HCT 90/76/7974 39.5  36.0 - 46.0 % Final   MCV 11/04/2023 94.0  80.0 - 100.0 fL Final   MCH 11/04/2023 30.5  26.0 - 34.0 pg Final   MCHC 11/04/2023 32.4  30.0 - 36.0 g/dL Final   RDW 90/76/7974 13.1  11.5 - 15.5 % Final   Platelets 11/04/2023 233  150 - 400 K/uL Final   nRBC 11/04/2023 0.0  0.0 - 0.2 % Final   Neutrophils Relative % 11/04/2023 56  % Final   Neutro Abs 11/04/2023  1.6 (L)  1.7 - 7.7 K/uL Final   Lymphocytes Relative 11/04/2023 36  % Final   Lymphs Abs 11/04/2023 1.0  0.7 - 4.0 K/uL Final   Monocytes Relative 11/04/2023 7  % Final   Monocytes Absolute 11/04/2023 0.2  0.1 - 1.0 K/uL Final   Eosinophils Relative 11/04/2023 0  % Final   Eosinophils Absolute 11/04/2023 0.0  0.0 - 0.5 K/uL Final   Basophils  Relative 11/04/2023 1  % Final   Basophils Absolute 11/04/2023 0.0  0.0 - 0.1 K/uL Final   Immature Granulocytes 11/04/2023 0  % Final   Abs Immature Granulocytes 11/04/2023 0.00  0.00 - 0.07 K/uL Final   Performed at [facility], [address] [place], [place] [address]   Hgb A1c MFr Bld 11/04/2023 4.4 (L)  4.8 - 5.6 % Final   Comment: (NOTE) Diagnosis of Diabetes The following HbA1c ranges recommended by the American Diabetes Association (ADA) may be used as an aid in the diagnosis of diabetes mellitus.  Hemoglobin             Suggested A1C NGSP%              Diagnosis  <5.7                   Non Diabetic  5.7-6.4                Pre-Diabetic  >6.4                   Diabetic  <7.0                   Glycemic control for                       adults with diabetes.     Mean Plasma Glucose 11/04/2023 79.58  mg/dL Final   Performed at [facility], [address] [place], [place] [address]   Magnesium  11/04/2023 2.3  1.7 - 2.4 mg/dL Final   Performed at [facility], [address] [place], [place] [address]   Alcohol, Ethyl (B) 11/04/2023 <15  <15 mg/dL Final   Comment: (NOTE) For medical purposes only. Performed at [facility], [address] [place], [place] [address]    Cholesterol 11/04/2023 188  0 - 200 mg/dL Final   Triglycerides 90/76/7974 118  <150 mg/dL Final   HDL 90/76/7974 47  >40 mg/dL Final   Total CHOL/HDL Ratio 11/04/2023 4.0  RATIO Final   VLDL 11/04/2023 24  0 - 40 mg/dL Final   LDL Cholesterol 11/04/2023 117 (H)  0 - 99 mg/dL Final   Comment:        Total Cholesterol/HDL:CHD Risk Coronary Heart Disease Risk Table                     Men   Women  1/2 Average Risk   3.4   3.3  Average Risk       5.0   4.4  2 X Average Risk   9.6   7.1  3 X Average Risk  23.4   11.0        Use the calculated Patient Ratio above and the CHD Risk Table to determine the patient's CHD Risk.        ATP III CLASSIFICATION (LDL):  <100      mg/dL   Optimal  899-870  mg/dL  Near or Above                    Optimal  130-159  mg/dL   Borderline  839-810  mg/dL   High  >809     mg/dL   Very High Performed at [facility], [address], [place], [place] [address]    TSH 11/04/2023 1.251  0.350 - 4.500 uIU/mL Final   Comment: Performed by a 3rd Generation assay with a functional sensitivity of <=0.01 uIU/mL. Performed at [facility], [address] [place], [place] [address]    POC Amphetamine UR 11/04/2023 None Detected  NONE DETECTED (Cut Off Level 1000 ng/mL) Final   POC Secobarbital (BAR) 11/04/2023 None Detected  NONE DETECTED (Cut Off Level 300 ng/mL) Final   POC Buprenorphine (BUP) 11/04/2023 None Detected  NONE DETECTED (Cut Off Level 10 ng/mL) Final   POC Oxazepam (BZO) 11/04/2023 None Detected  NONE DETECTED (Cut Off Level 300 ng/mL) Final   POC Cocaine UR 11/04/2023 None Detected  NONE DETECTED (Cut Off Level 300 ng/mL) Final   POC Methamphetamine UR 11/04/2023 None Detected  NONE DETECTED (Cut Off Level 1000 ng/mL) Final   POC Morphine 11/04/2023 None Detected  NONE DETECTED (Cut Off Level 300 ng/mL) Final   POC Methadone UR 11/04/2023 None Detected  NONE DETECTED (Cut Off Level 300 ng/mL) Final   POC Oxycodone  UR 11/04/2023 None Detected  NONE DETECTED (Cut Off Level 100 ng/mL) Final   POC Marijuana UR 11/04/2023 None Detected  NONE DETECTED (Cut Off Level 50 ng/mL) Final   Prolactin 11/04/2023 9.7  4.8 - 33.4 ng/mL Final   Comment: (NOTE) Performed At: [facility] [address] [place], [place] [id number] Jennette Shorter MD Ey:[id number]     Allergies: Morphine and codeine  Medications:  PTA Medications  Medication Sig   QUEtiapine  (SEROQUEL ) 25 MG tablet Take 1 tablet (25 mg total) by mouth at bedtime.   traZODone  (DESYREL ) 50 MG tablet Take 0.5 tablets (25 mg total) by mouth at bedtime as needed for sleep.   paliperidone  (INVEGA ) 3 MG 24 hr tablet Take 1 tablet (3 mg total) by  mouth at bedtime.   hydrOXYzine  (ATARAX ) 25 MG tablet Take 1 tablet (25 mg total) by mouth 3 (three) times daily as needed for anxiety.      Medical Decision Making  Observation unit    Recommendations  Based on my evaluation the patient does not appear to have an emergency medical condition.  Gaither Pouch, NP 01/21/24  8:38 PM

## 2024-01-21 NOTE — ED Provider Notes (Signed)
 [place] EMERGENCY DEPARTMENT AT [facility] Provider Note   CSN: [id number] Arrival date & time: 01/21/24  1339     Patient presents with: Chest Pain   Casey Lang is a 43 y.o. female.   42 year old female presents from behind for evaluation of chest pain.  Patient states that she went there to discuss her medications with them as she has not taken them in a while.  States she had multiple bad reactions to her medicines but would like to restart some.  She is very tearful and anxious.  States her mom dropped her off and walking and she got very nervous and started having some chest pain.  States it is resolved at this time.  She thinks it was likely a panic attack.  Denies any other symptoms or concerns.  Is not suicidal or homicidal.   Chest Pain Associated symptoms: no abdominal pain, no back pain, no cough, no fever, no palpitations, no shortness of breath and no vomiting        Prior to Admission medications   Medication Sig Start Date End Date Taking? Authorizing Provider  hydrOXYzine  (ATARAX ) 25 MG tablet Take 1 tablet (25 mg total) by mouth 3 (three) times daily as needed for anxiety. 11/07/23   Prentis Kitchens A, DO  paliperidone  (INVEGA ) 3 MG 24 hr tablet Take 1 tablet (3 mg total) by mouth at bedtime. 11/07/23   Prentis Kitchens LABOR, DO  QUEtiapine  (SEROQUEL ) 25 MG tablet Take 1 tablet (25 mg total) by mouth at bedtime. 11/07/23   Prentis Kitchens A, DO  traZODone  (DESYREL ) 50 MG tablet Take 0.5 tablets (25 mg total) by mouth at bedtime as needed for sleep. 11/07/23   Prentis Kitchens A, DO    Allergies: Morphine and codeine    Review of Systems  Constitutional:  Negative for chills and fever.  HENT:  Negative for ear pain and sore throat.   Eyes:  Negative for pain and visual disturbance.  Respiratory:  Negative for cough and shortness of breath.   Cardiovascular:  Positive for chest pain. Negative for palpitations.  Gastrointestinal:  Negative for abdominal  pain and vomiting.  Genitourinary:  Negative for dysuria and hematuria.  Musculoskeletal:  Negative for arthralgias and back pain.  Skin:  Negative for color change and rash.  Neurological:  Negative for seizures and syncope.  Psychiatric/Behavioral:  The patient is nervous/anxious.   All other systems reviewed and are negative.   Updated Vital Signs BP (!) 120/100 (BP Location: Right Arm)   Pulse 68   Temp 98.3 F (36.8 C) (Oral)   Resp 18   Ht 5' 6 (1.676 m)   Wt 82 kg   SpO2 100%   BMI 29.18 kg/m   Physical Exam Vitals and nursing note reviewed.  Constitutional:      General: She is not in acute distress.    Appearance: She is well-developed. She is not ill-appearing.  HENT:     Head: Normocephalic and atraumatic.  Eyes:     Conjunctiva/sclera: Conjunctivae normal.  Cardiovascular:     Rate and Rhythm: Normal rate and regular rhythm.     Heart sounds: No murmur heard. Pulmonary:     Effort: Pulmonary effort is normal. No respiratory distress.     Breath sounds: Normal breath sounds.  Abdominal:     Palpations: Abdomen is soft.     Tenderness: There is no abdominal tenderness.  Musculoskeletal:        General: No swelling.  Cervical back: Neck supple.  Skin:    General: Skin is warm and dry.     Capillary Refill: Capillary refill takes less than 2 seconds.  Neurological:     Mental Status: She is alert.  Psychiatric:        Mood and Affect: Mood normal.     Comments: Anxious appearing      (all labs ordered are listed, but only abnormal results are displayed) Labs Reviewed  BASIC METABOLIC PANEL WITH GFR - Abnormal; Notable for the following components:      Result Value   Calcium 8.5 (*)    All other components within normal limits  CBC - Abnormal; Notable for the following components:   WBC 2.9 (*)    All other components within normal limits  HCG, SERUM, QUALITATIVE  TROPONIN I (HIGH SENSITIVITY)  TROPONIN I (HIGH SENSITIVITY)    EKG: EKG  Interpretation Date/Time:  Wednesday January 21 2024 13:49:27 EST Ventricular Rate:  65 PR Interval:  136 QRS Duration:  72 QT Interval:  400 QTC Calculation: 416 R Axis:   13  Text Interpretation: Normal sinus rhythm Compared with prior EKG from 11/04/2023 Confirmed by Gennaro Bouchard ([id number]) on 01/21/2024 3:40:46 PM  Radiology: [facility] Chest 2 View Result Date: 01/21/2024 CLINICAL DATA:  Chest pain. EXAM: CHEST - 2 VIEW COMPARISON:  Chest radiograph dated 10/18/2014. FINDINGS: No focal consolidation, pleural effusion, or pneumothorax. The cardiac silhouette is within normal limits. No acute osseous pathology. Surgical clips over the chest wall. IMPRESSION: No active cardiopulmonary disease. Electronically Signed   By: Vanetta Chou M.D.   On: 01/21/2024 15:14     Procedures   Medications Ordered in the ED - No data to display                                  Medical Decision Making Social determinants of health: medication noncompliance   Patient here for chest pain that started after she got to be hooked.  It is resolved at this time.  Workup is negative.  No cardiac etiology for her symptoms.  She will be sent back there.  She is agreeable with the plan.  Not suicidal or homicidal and otherwise stable and feeling much improved.   Problems Addressed: Anxiety: chronic illness or injury with exacerbation, progression, or side effects of treatment Atypical chest pain: self-limited or minor problem  Amount and/or Complexity of Data Reviewed External Data Reviewed: notes.    Details: [facility] notes reviewed patient sent here for medical clearance as she had some chest pain  Labs: ordered. Decision-making details documented in ED Course.    Details: Ordered and reviewed by me and unremarkable  Radiology: ordered and independent interpretation performed. Decision-making details documented in ED Course.    Details: Ordered and interpreted by me independently of radiology Chest x-ray:  Shows no acute abnormality ECG/medicine tests: ordered and independent interpretation performed. Decision-making details documented in ED Course.    Details: Ordered and interpreted by me in the absence of cardiology and shows sinus rhythm, no STEMI, or significant change when compared to prior EKG  Risk OTC drugs. Prescription drug management. Diagnosis or treatment significantly limited by social determinants of health.     Final diagnoses:  Atypical chest pain  Anxiety    ED Discharge Orders     None          Gennaro Bouchard CROME, DO 01/21/24 1557

## 2024-01-21 NOTE — ED Notes (Signed)
 Called [facility] for dr gennaro to give report for pt returning to facility
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
## 2024-01-21 NOTE — ED Notes (Signed)
 Called safe tx for pt to return  to [facility]

## 2024-01-21 NOTE — Discharge Instructions (Signed)
 Please Return to [facility] to discuss with them your medications. Your workup in the ED regarding your chest pain was negative.

## 2024-01-21 NOTE — ED Provider Notes (Cosign Needed Addendum)
 HPI: Casey Lang is a 43 y.o. female with a history of bipolar d/o who presents to the [facility] today with her mother with complaints of chest pain.   As per the triage note: Pt presents to [name] this date with her mother, Casey Lang. Pt deneis SI, HI, AVH/Alchol/Drug use. Pt reports prior sucide attempt by overdose twenty years ago. Pt also reports prior self harm, during teen years by cutting. Pt reports tapping off her Bipolar medication in May 2025. Pt reports unable to sleep; also, currently having intense nightmare. Pt reports she is not currently seeing a therapist.  Brief Assessment in waiting Area: Patient is localizing to pain in the chest area, holding onto her chest with her right hand, states that pain is 9, with 10 being worst, states that she feels intense pressure to the mid sternum, states that she feels as though someone is sitting on her chest, and also feels intense pressure to her head. Denies pain to other areas of her body, denies feeling nauseous, states that pain began shortly prior to her presentation to this location, states that she has never experienced anything as such, denies that she intentionally ingested anything with a plan or intent to end her life.  Reports a history of bipolar d/o, states that her last suicide attempt was >20 yrs ago, reports that she self tapered off Seroquel  because she did not like the way it made her feel. States she was recently hospitalized from this location (mental health related) and was also started on Lamictal , also did not like this medication and stopped it, states that she was on a low dose of 25 mg daily for mood stabilization. States it has been a week since she took any type of medication and only takes Vit D tabs. Reports a history of breast CA with h/o of a double mastectomy last year, denies any other medical problems.  Denies SI, denies HI, denies AVH, denies paranoia. Denies delusional thinking. Denies first rank symptoms.  AAO to person, place, time and situation. Pt with flat affect and depressed mood, attention to personal hygiene and grooming is fair, eye contact is good, speech is clear & coherent. Thought contents are organized and logical, and pt is agreeable to being transferred to the ER to rule out medical etiologies for her chest pain. Writer has called the [facility] and spoken with Dr Delana who is agreeable to transfer.  Provider Handoff given to Dr Delana at the [facility] and the provider has agreed to accept the patient.  The patient appears reasonably stabilized for transfer considering the current resources, flow, and capabilities available in the UC at this time, and I doubt any other [facility] requiring further screening and/or treatment in the UC prior to transfer is present. Nursing staff has been made aware to initiate the transfer process via emergent EMS to the [facility].
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
## 2024-01-21 NOTE — ED Triage Notes (Signed)
 Patient bib [facility] from [facility] with  complaints of chest pain. She was seeking help at [facility] for her bipolar symptoms. She has not been taking her medications. She has been having ongoing bipolar symptoms for the past 2 weeks. On arrival to ED she seems very anxious.

## 2024-01-21 NOTE — ED Notes (Signed)
 Pt escorted by tech out of the ED to Safe transport vehicle for pick up. Belongings given to transport. Report called to [facility] before transport.

## 2024-01-22 ENCOUNTER — Other Ambulatory Visit: Payer: Self-pay

## 2024-01-22 ENCOUNTER — Other Ambulatory Visit (HOSPITAL_COMMUNITY)
Admission: EM | Admit: 2024-01-22 | Discharge: 2024-01-22 | Disposition: A | Discharge status: Home / Self Care | Source: Intra-hospital | Attending: Psychiatry | Admitting: Psychiatry

## 2024-01-22 DIAGNOSIS — F319 Bipolar disorder, unspecified: Secondary | ICD-10-CM | POA: Diagnosis not present

## 2024-01-22 DIAGNOSIS — Z91148 Patient's other noncompliance with medication regimen for other reason: Secondary | ICD-10-CM | POA: Diagnosis not present

## 2024-01-22 LAB — COMPREHENSIVE METABOLIC PANEL WITH GFR
ALT: 7 U/L (ref 0–44)
AST: 11 U/L — ABNORMAL LOW (ref 15–41)
Albumin: 3.9 g/dL (ref 3.5–5.0)
Alkaline Phosphatase: 41 U/L (ref 38–126)
Anion gap: 12 (ref 5–15)
BUN: 16 mg/dL (ref 6–20)
CO2: 26 mmol/L (ref 22–32)
Calcium: 9.2 mg/dL (ref 8.9–10.3)
Chloride: 101 mmol/L (ref 98–111)
Creatinine, Ser: 0.9 mg/dL (ref 0.44–1.00)
GFR, Estimated: >60 mL/min (ref >60)
Glucose, Bld: 86 mg/dL (ref 70–99)
Potassium: 3.7 mmol/L (ref 3.5–5.1)
Sodium: 139 mmol/L (ref 135–145)
Total Bilirubin: 1.4 mg/dL — ABNORMAL HIGH (ref 0.0–1.2)
Total Protein: 6.7 g/dL (ref 6.5–8.1)

## 2024-01-22 LAB — ETHANOL: Alcohol, Ethyl (B): <15 mg/dL (ref <15)

## 2024-01-22 LAB — TSH: TSH: 1.224 u[IU]/mL (ref 0.350–4.500)

## 2024-01-22 MED ORDER — MAGNESIUM HYDROXIDE 400 MG/5ML PO SUSP: 30.0000 mL | Freq: Every day | ORAL | Status: DC | PRN

## 2024-01-22 MED ORDER — HALOPERIDOL LACTATE 5 MG/ML IJ SOLN: 5.0000 mg | Freq: Three times a day (TID) | INTRAMUSCULAR | Status: DC | PRN

## 2024-01-22 MED ORDER — PALIPERIDONE ER 3 MG PO TB24: 3.0000 mg | ORAL_TABLET | Freq: Every day | ORAL | Status: DC

## 2024-01-22 MED ORDER — DIPHENHYDRAMINE HCL 50 MG/ML IJ SOLN: 50.0000 mg | Freq: Three times a day (TID) | INTRAMUSCULAR | Status: DC | PRN

## 2024-01-22 MED ORDER — HALOPERIDOL 5 MG PO TABS: 5.0000 mg | ORAL_TABLET | Freq: Three times a day (TID) | ORAL | Status: DC | PRN

## 2024-01-22 MED ORDER — ACETAMINOPHEN 325 MG PO TABS: 650.0000 mg | ORAL_TABLET | Freq: Four times a day (QID) | ORAL | Status: DC | PRN

## 2024-01-22 MED ORDER — QUETIAPINE FUMARATE 25 MG PO TABS: 25.0000 mg | ORAL_TABLET | Freq: Every day | ORAL | Status: DC

## 2024-01-22 MED ORDER — ALUM & MAG HYDROXIDE-SIMETH 200-200-20 MG/5ML PO SUSP: 30.0000 mL | ORAL | Status: DC | PRN

## 2024-01-22 MED ORDER — HALOPERIDOL LACTATE 5 MG/ML IJ SOLN: 10.0000 mg | Freq: Three times a day (TID) | INTRAMUSCULAR | Status: DC | PRN

## 2024-01-22 MED ORDER — LORAZEPAM 2 MG/ML IJ SOLN: 2.0000 mg | Freq: Three times a day (TID) | INTRAMUSCULAR | Status: DC | PRN

## 2024-01-22 MED ORDER — DIPHENHYDRAMINE HCL 50 MG PO CAPS: 50.0000 mg | ORAL_CAPSULE | Freq: Three times a day (TID) | ORAL | Status: DC | PRN

## 2024-01-22 NOTE — ED Notes (Signed)
 Pt presents as depressed, calm, cooperative. Pt says she feels 'prayerful'. Pt ate breakfast, provided with menstrual pad as requested. Pt generally soft spoke, denies si hi and avh- verbal contract for safety provided. Pt denies physical pain and discomforts. Last BM Tuesday, declines prn MOM.

## 2024-01-22 NOTE — Discharge Instructions (Addendum)
 Patient is instructed prior to discharge to:  Take all medications as prescribed by his/her mental healthcare provider. Report any adverse effects and or reactions from the medicines to his/her outpatient provider promptly. Keep all scheduled appointments, to ensure that you are getting refills on time and to avoid any interruption in your medication.  If you are unable to keep an appointment call to reschedule.  Be sure to follow-up with resources and follow-up appointments provided.  Patient has been instructed & cautioned: To not engage in alcohol and or illegal drug use while on prescription medicines. In the event of worsening symptoms, patient is instructed to call the crisis hotline, 911 and or go to the nearest ED for appropriate evaluation and treatment of symptoms. To follow-up with his/her primary care provider for your other medical issues, concerns and or health care needs.  Information: -National Suicide Prevention Lifeline 1-800-SUICIDE or (520)793-9470.  -988 offers 24/7 access to trained crisis counselors who can help people experiencing mental health-related distress. People can call or text 988 or chat 988lifeline.org for themselves or if they are worried about a loved one who may need crisis support.       [facility] Care Management...  Patient request out patient resources   [facility] [address], [place], [place] [address]  [phone]  [facility] [address], [place], [place] [address] Phone: [phone]  [facility] Office [address] [facility] [address] [place], [place] [address] Phone: [phone]

## 2024-01-22 NOTE — ED Provider Notes (Signed)
 Facility Based Crisis Admission H&P  Date: 01/22/2024 Patient Name: Casey Lang MRN: [id number] Chief Complaint: Medication change  Diagnoses:  Final diagnoses:  Bipolar 1 disorder (HCC)  History of medication noncompliance    HPI:  Per chart review: Casey Lang, 43 y/o female with a history of bipolar disorder, general anxiety, presented to [facility], after she was seen here today and sent to the ED for medical clearance due to chest pain.  According to the patient her medication is not working for her bipolar disorder.  According to her she does see a provider via Zoom.  Patient stated that she was on Seroquel  800 mg then they taper her down and she is currently at her 100 mg but the medicine she does not believe it is working properly.  When asked if she had tried other medication such as risperidone and patient stated yes but does not remember it being effective.  Patient is also not compliant with her medication regiment most of her medicine that she has she keeps and I do not work so she do not take them.  Writer discussed with patient that we could restart her back or on her Seroquel  100 mg but then she would need to reach out back to her provider to update him on what is going on to see if they can switch her medicine to something else.  Patient currently lives with her mother.  Currently not seeing a therapist.  Does not appear to be in any immediate distress at this present moment.   Face-to-face evaluation of patient, patient alert and oriented x 4, speech is clear, maintain eye contact.  Patient denies SI, HI, AVH or paranoia.  Denies alcohol use denies illicit drug use.  Denies access to guns denied wanting to hurt herself or others.  Patient stated that she lives an hour away from the facility and was contemplating going home but she does not have a ride because of the distance.   Discussed with current plan for overnight observation and reassess in the morning for possible  discharge.    Update : Patient admitted to [facility] pending possible discharge in the am.   PHQ 2-9:  Flowsheet Row Office Visit from 07/17/2021 in [facility] for [employer] at [place]  Thoughts that you would be better off dead, or of hurting yourself in some way Not at all  PHQ-9 Total Score 0    Flowsheet Row ED from 01/21/2024 in [facility] Most recent reading at 01/22/2024 12:32 AM ED from 01/21/2024 in [facility] Emergency Department at [facility] Most recent reading at 01/21/2024  1:45 PM Ad[facility] (Discharged) [facility] Most recent reading at 11/05/2023 12:00 AM  C-SSRS RISK CATEGORY No Risk No Risk No Risk      Total Time spent with patient: 15 minutes  Musculoskeletal  Strength & Muscle Tone: within normal limits Gait & Station: normal Patient leans: N/A  Psychiatric Specialty Exam  Presentation General Appearance:  Casual  Eye Contact: Good  Speech: Clear and Coherent  Speech Volume: Normal  Handedness: Right   Mood and Affect  Mood: Euthymic  Affect: Congruent   Thought Process  Thought Processes: Coherent  Descriptions of Associations:Intact  Orientation:Full (Time, Place and Person)  Thought Content:Logical  Diagnosis of Schizophrenia or Schizoaffective disorder in past: No   Hallucinations:Hallucinations: None  Ideas of Reference:None  Suicidal Thoughts:Suicidal Thoughts: No  Homicidal Thoughts:Homicidal Thoughts: No   Sensorium  Memory: Immediate Fair  Judgment: Fair  Insight: Fair   Chartered Certified Accountant: Good  Attention Span: Fair  Recall: Fair  Fund of Knowledge: Fair  Language: Fair   Psychomotor Activity  Psychomotor Activity: Psychomotor Activity: Normal   Assets  Assets: Desire for Improvement   Sleep  Sleep: Sleep: Fair Number of Hours of Sleep: 3   Nutritional  Assessment (For OBS and FBC admissions only) Has the patient had a weight loss or gain of 10 pounds or more in the last 3 months?: No Has the patient had a decrease in food intake/or appetite?: No Does the patient have dental problems?: No Does the patient have eating habits or behaviors that may be indicators of an eating disorder including binging or inducing vomiting?: No Has the patient recently lost weight without trying?: 0 Has the patient been eating poorly because of a decreased appetite?: 0 Malnutrition Screening Tool Score: 0    Physical Exam ROS  Blood pressure 120/68, pulse 69, temperature 98 F (36.7 C), temperature source Oral, resp. rate 19, SpO2 99%. There is no height or weight on file to calculate BMI.  Past Psychiatric History: See H & P   Is the patient at risk to self? No  Has the patient been a risk to self in the past 6 months? Yes .    Has the patient been a risk to self within the distant past? Yes   Is the patient a risk to others? No   Has the patient been a risk to others in the past 6 months? No   Has the patient been a risk to others within the distant past? No   Past Medical History: See Chart Family History: N/A Social History: N/A  Last Labs:  Admission on 01/21/2024, Discharged on 01/22/2024  Component Date Value Ref Range Status   WBC 01/21/2024 3.5 (L)  4.0 - 10.5 K/uL Final   RBC 01/21/2024 4.15  3.87 - 5.11 MIL/uL Final   Hemoglobin 01/21/2024 12.6  12.0 - 15.0 g/dL Final   HCT 87/89/7974 38.7  36.0 - 46.0 % Final   MCV 01/21/2024 93.3  80.0 - 100.0 fL Final   MCH 01/21/2024 30.4  26.0 - 34.0 pg Final   MCHC 01/21/2024 32.6  30.0 - 36.0 g/dL Final   RDW 87/89/7974 13.2  11.5 - 15.5 % Final   Platelets 01/21/2024 295  150 - 400 K/uL Final   nRBC 01/21/2024 0.0  0.0 - 0.2 % Final   Neutrophils Relative % 01/21/2024 49  % Final   Neutro Abs 01/21/2024 1.7  1.7 - 7.7 K/uL Final   Lymphocytes Relative 01/21/2024 41  % Final   Lymphs Abs  01/21/2024 1.4  0.7 - 4.0 K/uL Final   Monocytes Relative 01/21/2024 8  % Final   Monocytes Absolute 01/21/2024 0.3  0.1 - 1.0 K/uL Final   Eosinophils Relative 01/21/2024 1  % Final   Eosinophils Absolute 01/21/2024 0.0  0.0 - 0.5 K/uL Final   Basophils Relative 01/21/2024 1  % Final   Basophils Absolute 01/21/2024 0.0  0.0 - 0.1 K/uL Final   Immature Granulocytes 01/21/2024 0  % Final   Abs Immature Granulocytes 01/21/2024 0.01  0.00 - 0.07 K/uL Final   Performed at [facility], [address] [place], [place] [address]   Sodium 01/21/2024 139  135 - 145 mmol/L Final   Potassium 01/21/2024 3.7  3.5 - 5.1 mmol/L Final   Chloride 01/21/2024 101  98 -  111 mmol/L Final   CO2 01/21/2024 26  22 - 32 mmol/L Final   Glucose, Bld 01/21/2024 86  70 - 99 mg/dL Final   Glucose reference range applies only to samples taken after fasting for at least 8 hours.   BUN 01/21/2024 16  6 - 20 mg/dL Final   Creatinine, Ser 01/21/2024 0.90  0.44 - 1.00 mg/dL Final   Calcium 87/89/7974 9.2  8.9 - 10.3 mg/dL Final   Total Protein 87/89/7974 6.7  6.5 - 8.1 g/dL Final   Albumin 87/89/7974 3.9  3.5 - 5.0 g/dL Final   AST 87/89/7974 11 (L)  15 - 41 U/L Final   ALT 01/21/2024 7  0 - 44 U/L Final   Alkaline Phosphatase 01/21/2024 41  38 - 126 U/L Final   Total Bilirubin 01/21/2024 1.4 (H)  0.0 - 1.2 mg/dL Final   GFR, Estimated 01/21/2024 >60  >60 mL/min Final   Comment: (NOTE) Calculated using the CKD-EPI Creatinine Equation (2021)    Anion gap 01/21/2024 12  5 - 15 Final   Performed at [facility], [address] [place], [place] [address]   TSH 01/21/2024 1.224  0.350 - 4.500 uIU/mL Final   Comment: Performed by a 3rd Generation assay with a functional sensitivity of <=0.01 uIU/mL. Performed at [facility], [address], [place], [place] [address]    Alcohol, Ethyl (B) 01/21/2024 <15  <15 mg/dL Final   Comment: (NOTE) For medical purposes only. Performed at [facility]  Lab, [address], [place], [place] [address]    Preg Test, Ur 01/21/2024 Negative  Negative Final   POC Amphetamine UR 01/21/2024 None Detected  NONE DETECTED (Cut Off Level 1000 ng/mL) Final   POC Secobarbital (BAR) 01/21/2024 None Detected  NONE DETECTED (Cut Off Level 300 ng/mL) Final   POC Buprenorphine (BUP) 01/21/2024 None Detected  NONE DETECTED (Cut Off Level 10 ng/mL) Final   POC Oxazepam (BZO) 01/21/2024 None Detected  NONE DETECTED (Cut Off Level 300 ng/mL) Final   POC Cocaine UR 01/21/2024 None Detected  NONE DETECTED (Cut Off Level 300 ng/mL) Final   POC Methamphetamine UR 01/21/2024 None Detected  NONE DETECTED (Cut Off Level 1000 ng/mL) Final   POC Morphine 01/21/2024 None Detected  NONE DETECTED (Cut Off Level 300 ng/mL) Final   POC Methadone UR 01/21/2024 None Detected  NONE DETECTED (Cut Off Level 300 ng/mL) Final   POC Oxycodone  UR 01/21/2024 None Detected  NONE DETECTED (Cut Off Level 100 ng/mL) Final   POC Marijuana UR 01/21/2024 None Detected  NONE DETECTED (Cut Off Level 50 ng/mL) Final  Admission on 01/21/2024, Discharged on 01/21/2024  Component Date Value Ref Range Status   Sodium 01/21/2024 138  135 - 145 mmol/L Final   Potassium 01/21/2024 3.6  3.5 - 5.1 mmol/L Final   Chloride 01/21/2024 105  98 - 111 mmol/L Final   CO2 01/21/2024 25  22 - 32 mmol/L Final   Glucose, Bld 01/21/2024 86  70 - 99 mg/dL Final   Glucose reference range applies only to samples taken after fasting for at least 8 hours.   BUN 01/21/2024 15  6 - 20 mg/dL Final   Creatinine, Ser 01/21/2024 0.80  0.44 - 1.00 mg/dL Final   Calcium 87/89/7974 8.5 (L)  8.9 - 10.3 mg/dL Final   GFR, Estimated 01/21/2024 >60  >60 mL/min Final   Comment: (NOTE) Calculated using the CKD-EPI Creatinine Equation (2021)    Anion gap 01/21/2024 8  5 - 15 Final   Performed at [facility], [address], [place], [place] [address]   WBC 01/21/2024 2.9 (L)  4.0 - 10.5 K/uL Final   RBC 01/21/2024 3.88  3.87 -  5.11 MIL/uL Final   Hemoglobin 01/21/2024 12.1  12.0 - 15.0 g/dL Final   HCT 87/89/7974 36.8  36.0 - 46.0 % Final   MCV 01/21/2024 94.8  80.0 - 100.0 fL Final   MCH 01/21/2024 31.2  26.0 - 34.0 pg Final   MCHC 01/21/2024 32.9  30.0 - 36.0 g/dL Final   RDW 87/89/7974 13.2  11.5 - 15.5 % Final   Platelets 01/21/2024 266  150 - 400 K/uL Final   nRBC 01/21/2024 0.0  0.0 - 0.2 % Final   Performed at [facility], [address], [place], [place] [address]   Troponin I (High Sensitivity) 01/21/2024 <2  <18 ng/L Final   Comment: (NOTE) Elevated high sensitivity troponin I (hsTnI) values and significant  changes across serial measurements may suggest ACS but many other  chronic and acute conditions are known to elevate hsTnI results.  Refer to the Links section for chest pain algorithms and additional  guidance. Performed at [facility], [address] [place], [place] [address]    Preg, Serum 01/21/2024 NEGATIVE  NEGATIVE Final   Comment:        THE SENSITIVITY OF THIS METHODOLOGY IS >10 mIU/mL. Performed at [facility], [address] [place], [place] [address]   Admission on 11/04/2023, Discharged on 11/07/2023  Component Date Value Ref Range Status   Glucose-Capillary 11/06/2023 66 (L)  70 - 99 mg/dL Final   Glucose reference range applies only to samples taken after fasting for at least 8 hours.   Glucose-Capillary 11/06/2023 94  70 - 99 mg/dL Final   Glucose reference range applies only to samples taken after fasting for at least 8 hours.  Admission on 11/04/2023, Discharged on 11/04/2023  Component Date Value Ref Range Status   Sodium 11/04/2023 140  135 - 145 mmol/L Final   Potassium 11/04/2023 3.9  3.5 - 5.1 mmol/L Final   Chloride 11/04/2023 104  98 - 111 mmol/L Final   CO2 11/04/2023 25  22 - 32 mmol/L Final   Glucose, Bld 11/04/2023 83  70 - 99 mg/dL Final   Glucose reference range applies only to samples taken after fasting for at least 8 hours.    BUN 11/04/2023 16  6 - 20 mg/dL Final   Creatinine, Ser 11/04/2023 0.88  0.44 - 1.00 mg/dL Final   Calcium 90/76/7974 9.3  8.9 - 10.3 mg/dL Final   Total Protein 90/76/7974 6.5  6.5 - 8.1 g/dL Final   Albumin 90/76/7974 4.0  3.5 - 5.0 g/dL Final   AST 90/76/7974 12 (L)  15 - 41 U/L Final   ALT 11/04/2023 7  0 - 44 U/L Final   Alkaline Phosphatase 11/04/2023 33 (L)  38 - 126 U/L Final   Total Bilirubin 11/04/2023 1.3 (H)  0.0 - 1.2 mg/dL Final   GFR, Estimated 11/04/2023 >60  >60 mL/min Final   Comment: (NOTE) Calculated using the CKD-EPI Creatinine Equation (2021)    Anion gap 11/04/2023 11  5 - 15 Final   Performed at [facility], [address] [place], [place] [address]   WBC 11/04/2023 2.8 (L)  4.0 - 10.5 K/uL Final   RBC 11/04/2023 4.20  3.87 - 5.11 MIL/uL Final  Hemoglobin 11/04/2023 12.8  12.0 - 15.0 g/dL Final   HCT 90/76/7974 39.5  36.0 - 46.0 % Final   MCV 11/04/2023 94.0  80.0 - 100.0 fL Final   MCH 11/04/2023 30.5  26.0 - 34.0 pg Final   MCHC 11/04/2023 32.4  30.0 - 36.0 g/dL Final   RDW 90/76/7974 13.1  11.5 - 15.5 % Final   Platelets 11/04/2023 233  150 - 400 K/uL Final   nRBC 11/04/2023 0.0  0.0 - 0.2 % Final   Neutrophils Relative % 11/04/2023 56  % Final   Neutro Abs 11/04/2023 1.6 (L)  1.7 - 7.7 K/uL Final   Lymphocytes Relative 11/04/2023 36  % Final   Lymphs Abs 11/04/2023 1.0  0.7 - 4.0 K/uL Final   Monocytes Relative 11/04/2023 7  % Final   Monocytes Absolute 11/04/2023 0.2  0.1 - 1.0 K/uL Final   Eosinophils Relative 11/04/2023 0  % Final   Eosinophils Absolute 11/04/2023 0.0  0.0 - 0.5 K/uL Final   Basophils Relative 11/04/2023 1  % Final   Basophils Absolute 11/04/2023 0.0  0.0 - 0.1 K/uL Final   Immature Granulocytes 11/04/2023 0  % Final   Abs Immature Granulocytes 11/04/2023 0.00  0.00 - 0.07 K/uL Final   Performed at [facility], [address] [place], [place] [address]   Hgb A1c MFr Bld 11/04/2023 4.4 (L)  4.8 - 5.6 % Final    Comment: (NOTE) Diagnosis of Diabetes The following HbA1c ranges recommended by the American Diabetes Association (ADA) may be used as an aid in the diagnosis of diabetes mellitus.  Hemoglobin             Suggested A1C NGSP%              Diagnosis  <5.7                   Non Diabetic  5.7-6.4                Pre-Diabetic  >6.4                   Diabetic  <7.0                   Glycemic control for                       adults with diabetes.     Mean Plasma Glucose 11/04/2023 79.58  mg/dL Final   Performed at [facility], [address], [place], [place] [address]   Magnesium  11/04/2023 2.3  1.7 - 2.4 mg/dL Final   Performed at [facility], [address] [place], [place] [address]   Alcohol, Ethyl (B) 11/04/2023 <15  <15 mg/dL Final   Comment: (NOTE) For medical purposes only. Performed at [facility], [address], [place], [place] [address]    Cholesterol 11/04/2023 188  0 - 200 mg/dL Final   Triglycerides 90/76/7974 118  <150 mg/dL Final   HDL 90/76/7974 47  >40 mg/dL Final   Total CHOL/HDL Ratio 11/04/2023 4.0  RATIO Final   VLDL 11/04/2023 24  0 - 40 mg/dL Final   LDL Cholesterol 11/04/2023 117 (H)  0 - 99 mg/dL Final   Comment:        Total Cholesterol/HDL:CHD Risk Coronary Heart Disease Risk Table  Men   Women  1/2 Average Risk   3.4   3.3  Average Risk       5.0   4.4  2 X Average Risk   9.6   7.1  3 X Average Risk  23.4   11.0        Use the calculated Patient Ratio above and the CHD Risk Table to determine the patient's CHD Risk.        ATP III CLASSIFICATION (LDL):  <100     mg/dL   Optimal  899-870  mg/dL   Near or Above                    Optimal  130-159  mg/dL   Borderline  839-810  mg/dL   High  >809     mg/dL   Very High Performed at [facility], [address], [place], [place] [address]    TSH 11/04/2023 1.251  0.350 - 4.500 uIU/mL Final   Comment: Performed by a 3rd Generation assay with a  functional sensitivity of <=0.01 uIU/mL. Performed at [facility], [address], [place], [place] [address]    POC Amphetamine UR 11/04/2023 None Detected  NONE DETECTED (Cut Off Level 1000 ng/mL) Final   POC Secobarbital (BAR) 11/04/2023 None Detected  NONE DETECTED (Cut Off Level 300 ng/mL) Final   POC Buprenorphine (BUP) 11/04/2023 None Detected  NONE DETECTED (Cut Off Level 10 ng/mL) Final   POC Oxazepam (BZO) 11/04/2023 None Detected  NONE DETECTED (Cut Off Level 300 ng/mL) Final   POC Cocaine UR 11/04/2023 None Detected  NONE DETECTED (Cut Off Level 300 ng/mL) Final   POC Methamphetamine UR 11/04/2023 None Detected  NONE DETECTED (Cut Off Level 1000 ng/mL) Final   POC Morphine 11/04/2023 None Detected  NONE DETECTED (Cut Off Level 300 ng/mL) Final   POC Methadone UR 11/04/2023 None Detected  NONE DETECTED (Cut Off Level 300 ng/mL) Final   POC Oxycodone  UR 11/04/2023 None Detected  NONE DETECTED (Cut Off Level 100 ng/mL) Final   POC Marijuana UR 11/04/2023 None Detected  NONE DETECTED (Cut Off Level 50 ng/mL) Final   Prolactin 11/04/2023 9.7  4.8 - 33.4 ng/mL Final   Comment: (NOTE) Performed At: [facility] [place] [address] [place], [place] [id number] Jennette Shorter MD Ey:[id number]     Allergies: Morphine and codeine  Medications:  Facility Ordered Medications  Medication   acetaminophen  (TYLENOL ) tablet 650 mg   alum & mag hydroxide-simeth (MAALOX/MYLANTA) 200-200-20 MG/5ML suspension 30 mL   magnesium  hydroxide (MILK OF MAGNESIA) suspension 30 mL   haloperidol  (HALDOL ) tablet 5 mg   And   diphenhydrAMINE  (BENADRYL ) capsule 50 mg   haloperidol  lactate (HALDOL ) injection 5 mg   And   diphenhydrAMINE  (BENADRYL ) injection 50 mg   And   LORazepam  (ATIVAN ) injection 2 mg   haloperidol  lactate (HALDOL ) injection 10 mg   And   diphenhydrAMINE  (BENADRYL ) injection 50 mg   And   LORazepam  (ATIVAN ) injection 2 mg   paliperidone  (INVEGA ) 24 hr tablet 3 mg    QUEtiapine  (SEROQUEL ) tablet 25 mg   PTA Medications  Medication Sig   QUEtiapine  (SEROQUEL ) 25 MG tablet Take 1 tablet (25 mg total) by mouth at bedtime.   traZODone  (DESYREL ) 50 MG tablet Take 0.5 tablets (25 mg total) by mouth at bedtime as needed for sleep.   paliperidone  (INVEGA ) 3 MG 24 hr tablet Take 1 tablet (3 mg total) by mouth at  bedtime.   hydrOXYzine  (ATARAX ) 25 MG tablet Take 1 tablet (25 mg total) by mouth 3 (three) times daily as needed for anxiety.    Long Term Goals: Improvement in symptoms so as ready for discharge  Short Term Goals: Patient will verbalize feelings in meetings with treatment team members., Patient will attend at least of 50% of the groups daily., Pt will complete the PHQ9 on admission, day 3 and discharge., Patient will participate in completing the [place] Suicide Severity Rating Scale, Patient will score a low risk of violence for 24 hours prior to discharge, and Patient will take medications as prescribed daily.  Medical Decision Making  Pt admitted to [facility].   Home medication continued -Invega , seroquel  Prn Meds -Agitation protocol med -Tylenol , Maalox, MOM, Atarax   Recommendations  Based on my evaluation the patient does not appear to have an emergency medical condition.  Pt admitted to [facility] pending reassessment for possible discharge in the am.   Thurman LULLA Ivans, NP 01/22/2024  5:50 AM

## 2024-01-22 NOTE — ED Notes (Signed)
 Discharge paperwork discussed with pt, questions denied. Pt escorted off unit by staff, items returned.

## 2024-01-22 NOTE — ED Provider Notes (Signed)
 FBC/OBS ASAP Discharge Summary  Date and Time: 01/22/2024 10:28 AM  Name: Casey Lang  MRN:  [id number]   Discharge Diagnoses:  Final diagnoses:  Bipolar 1 disorder (HCC)  History of medication noncompliance   Casey Lang is a 43 year old female who presented voluntarily to [facility] behavioral health on yesterday, 01/21/2024. Immediately upon arrival to [facility] patient complained of urgent chest pain.  She was transferred to [facility] emergency department for medical clearance.  Patient returned from [facility] emergency department to [facility] behavioral health approximately 1905pm on yesterday.  Upon return from emergency department patient reported readiness to discharge home, she denied SI/HI/AVH.  Patient briefly admitted to [facility] behavioral health facility based crisis for continued assessment/monitoring.  Subjective: Patient states I am ready to go home, I would like to call my mother and have her come in and get me.  Casey Lang reports came in to [facility] behavioral health for assessment on yesterday after her mother verbalized concerns related to her mood and medication noncompliance.  Chart reviewed and treatment plan patient discussed with attending psychiatrist, Dr. Lawrnce on 01/22/2024.  Patient assessed by this nurse practitioner face-to-face.  Patient is seated, in no apparent distress.  She is alert and oriented, pleasant and cooperative during assessment.  She presents with anxious mood, congruent affect. Patient continues to deny SI/HI/AVH.  There is no evidence of delusional thought content and no indication that patient is responding to internal stimuli.  Patient endorses improved sleep while at [facility] behavioral health.  She endorses average appetite.  Willard's mental health history includes depression, bipolar 1 disorder, GAD, PTSD and conversion disorder with attacks or seizures.  Multiple previous inpatient  psychiatric admissions.  Most recent inpatient psychiatric admission 11/04/2023 admitted to [facility] behavioral health.  Patient is followed for outpatient psychiatry/medication management by Dr. Rockey Law.  She is intermittently compliant with medications.  Patient reports she has a strong faith in Christianity and believes that God will heal me.  She is willing to remain compliant with medications and until healing takes place. Patient reports since age 38 she has trialed several different medications.  Most recent medications include quetiapine  100 mg daily, trazodone  25 mg nightly as needed, Lamictal  50mg  at bedtime and gabapentin  300mg  QHS.  Patient reports cycle of stopping medications after I do not like the way the medications make me feel.  She then suffers insomnia and restarts medications.  Current treatment plan includes engaging in individual counseling.  Patient will follow-up with outpatient individual counseling options provided.  Briefly reviewed IOP/PHP options, patient declines.  Patient offered support and encouragement.  Continued FBC stay offered, patient declines.   Patient and family (patient's mother, Kamryn Messineo) are educated and verbalize understanding of mental health resources and other crisis services in the community. They are instructed to call 911 and present to the nearest emergency room should patient experience any suicidal/homicidal ideation, auditory/visual/hallucinations, or detrimental worsening of mental health condition.       Stay Summary: 01/22/2024- 0549am HPI:  Per chart review: Casey Lang, 43 y/o female with a history of bipolar disorder, general anxiety, presented to [facility], after she was seen here today and sent to the ED for medical clearance due to chest pain.  According to the patient her medication is not working for her bipolar disorder.  According to her she does see a provider via Zoom.  Patient stated that she was on Seroquel  800  mg then they taper her down and she is currently  at her 100 mg but the medicine she does not believe it is working properly.  When asked if she had tried other medication such as risperidone and patient stated yes but does not remember it being effective.  Patient is also not compliant with her medication regiment most of her medicine that she has she keeps and I do not work so she do not take them.  Writer discussed with patient that we could restart her back or on her Seroquel  100 mg but then she would need to reach out back to her provider to update him on what is going on to see if they can switch her medicine to something else.  Patient currently lives with her mother.  Currently not seeing a therapist.  Does not appear to be in any immediate distress at this present moment.   Face-to-face evaluation of patient, patient alert and oriented x 4, speech is clear, maintain eye contact.  Patient denies SI, HI, AVH or paranoia.  Denies alcohol use denies illicit drug use.  Denies access to guns denied wanting to hurt herself or others.  Patient stated that she lives an hour away from the facility and was contemplating going home but she does not have a ride because of the distance.   Discussed with current plan for overnight observation and reassess in the morning for possible discharge.    Update : Patient admitted to [facility] pending possible discharge in the am.     Total Time spent with patient: 30 minutes  Past Psychiatric History: depression, bipolar 1 disorder, GAD, PTSD and conversion disorder with attacks or seizures. Past Medical History: Iron deficiency anemia due to chronic blood loss, leukopenia, hyperprolactinemia, psychogenic nonepileptic seizure, gestational hypertension, ductal carcinoma of left breast, insomnia, vitamin D  deficiency Family History: None reported Family Psychiatric History: None reported Social History: Patient resides in [place] with her 10-year-old son.  She is employed  in the immunologist.  She denies alcohol and substance use. Tobacco Cessation:  N/A, patient does not currently use tobacco products  Current Medications:  Current Facility-Administered Medications  Medication Dose Route Frequency Provider Last Rate Last Admin   acetaminophen  (TYLENOL ) tablet 650 mg  650 mg Oral Q6H PRN Onuoha, Chinwendu V, NP       alum & mag hydroxide-simeth (MAALOX/MYLANTA) 200-200-20 MG/5ML suspension 30 mL  30 mL Oral Q4H PRN Onuoha, Chinwendu V, NP       haloperidol  (HALDOL ) tablet 5 mg  5 mg Oral TID PRN Onuoha, Chinwendu V, NP       And   diphenhydrAMINE  (BENADRYL ) capsule 50 mg  50 mg Oral TID PRN Onuoha, Chinwendu V, NP       haloperidol  lactate (HALDOL ) injection 5 mg  5 mg Intramuscular TID PRN Onuoha, Chinwendu V, NP       And   diphenhydrAMINE  (BENADRYL ) injection 50 mg  50 mg Intramuscular TID PRN Onuoha, Chinwendu V, NP       And   LORazepam  (ATIVAN ) injection 2 mg  2 mg Intramuscular TID PRN Onuoha, Chinwendu V, NP       haloperidol  lactate (HALDOL ) injection 10 mg  10 mg Intramuscular TID PRN Onuoha, Chinwendu V, NP       And   diphenhydrAMINE  (BENADRYL ) injection 50 mg  50 mg Intramuscular TID PRN Onuoha, Chinwendu V, NP       And   LORazepam  (ATIVAN ) injection 2 mg  2 mg Intramuscular TID PRN Onuoha, Chinwendu V, NP  magnesium  hydroxide (MILK OF MAGNESIA) suspension 30 mL  30 mL Oral Daily PRN Onuoha, Chinwendu V, NP       paliperidone  (INVEGA ) 24 hr tablet 3 mg  3 mg Oral QHS Onuoha, Chinwendu V, NP       QUEtiapine  (SEROQUEL ) tablet 25 mg  25 mg Oral QHS Onuoha, Chinwendu V, NP       Current Outpatient Medications  Medication Sig Dispense Refill   gabapentin  (NEURONTIN ) 300 MG capsule Take 300 mg by mouth at bedtime.     lamoTRIgine  (LAMICTAL ) 25 MG tablet Take 50 mg by mouth at bedtime.     OVER THE COUNTER MEDICATION Take 1 tablet by mouth daily. Organic Iron Tablet     OVER THE COUNTER MEDICATION Take 1 tablet by mouth 2 (two)  times a week. Organic Vitamin D3. Takes on Tuesday and Thursday     QUEtiapine  (SEROQUEL ) 100 MG tablet Take 100 mg by mouth at bedtime.     traZODone  (DESYREL ) 50 MG tablet Take 0.5 tablets (25 mg total) by mouth at bedtime as needed for sleep. 30 tablet 0   zaleplon (SONATA) 10 MG capsule Take 10 mg by mouth at bedtime as needed for sleep.     hydrOXYzine  (ATARAX ) 25 MG tablet Take 1 tablet (25 mg total) by mouth 3 (three) times daily as needed for anxiety. (Patient not taking: Reported on 01/22/2024) 30 tablet 0    PTA Medications:  Facility Ordered Medications  Medication   acetaminophen  (TYLENOL ) tablet 650 mg   alum & mag hydroxide-simeth (MAALOX/MYLANTA) 200-200-20 MG/5ML suspension 30 mL   magnesium  hydroxide (MILK OF MAGNESIA) suspension 30 mL   haloperidol  (HALDOL ) tablet 5 mg   And   diphenhydrAMINE  (BENADRYL ) capsule 50 mg   haloperidol  lactate (HALDOL ) injection 5 mg   And   diphenhydrAMINE  (BENADRYL ) injection 50 mg   And   LORazepam  (ATIVAN ) injection 2 mg   haloperidol  lactate (HALDOL ) injection 10 mg   And   diphenhydrAMINE  (BENADRYL ) injection 50 mg   And   LORazepam  (ATIVAN ) injection 2 mg   paliperidone  (INVEGA ) 24 hr tablet 3 mg   QUEtiapine  (SEROQUEL ) tablet 25 mg   PTA Medications  Medication Sig   traZODone  (DESYREL ) 50 MG tablet Take 0.5 tablets (25 mg total) by mouth at bedtime as needed for sleep.   gabapentin  (NEURONTIN ) 300 MG capsule Take 300 mg by mouth at bedtime.   lamoTRIgine  (LAMICTAL ) 25 MG tablet Take 50 mg by mouth at bedtime.   QUEtiapine  (SEROQUEL ) 100 MG tablet Take 100 mg by mouth at bedtime.   zaleplon (SONATA) 10 MG capsule Take 10 mg by mouth at bedtime as needed for sleep.   hydrOXYzine  (ATARAX ) 25 MG tablet Take 1 tablet (25 mg total) by mouth 3 (three) times daily as needed for anxiety. (Patient not taking: Reported on 01/22/2024)       07/17/2021   10:30 AM  Depression screen PHQ 2/9  Decreased Interest 0  Down, Depressed,  Hopeless 0  PHQ - 2 Score 0  Altered sleeping 0  Tired, decreased energy 0  Change in appetite 0  Feeling bad or failure about yourself  0  Trouble concentrating 0  Moving slowly or fidgety/restless 0  Suicidal thoughts 0  PHQ-9 Score 0      Data saved with a previous flowsheet row definition    Flowsheet Row ED from 01/22/2024 in [facility] Most recent reading at 01/22/2024  5:56 AM ED from 01/21/2024 in  [facility] Most recent reading at 01/22/2024 12:32 AM ED from 01/21/2024 in [facility] Emergency Department at [facility] Most recent reading at 01/21/2024  1:45 PM  C-SSRS RISK CATEGORY No Risk No Risk No Risk    Musculoskeletal  Strength & Muscle Tone: within normal limits Gait & Station: normal Patient leans: N/A  Psychiatric Specialty Exam  Presentation  General Appearance:  Appropriate for Environment; Casual  Eye Contact: Good  Speech: Clear and Coherent; Normal Rate  Speech Volume: Normal  Handedness: Right   Mood and Affect  Mood: Anxious  Affect: Congruent   Thought Process  Thought Processes: Coherent; Goal Directed; Linear  Descriptions of Associations:Intact  Orientation:Full (Time, Place and Person)  Thought Content:Logical; WDL  Diagnosis of Schizophrenia or Schizoaffective disorder in past: No    Hallucinations:Hallucinations: None  Ideas of Reference:None  Suicidal Thoughts:Suicidal Thoughts: No  Homicidal Thoughts:Homicidal Thoughts: No   Sensorium  Memory: Immediate Good; Recent Fair  Judgment: Fair  Insight: Fair   Art Therapist  Concentration: Good  Attention Span: Good  Recall: Good  Fund of Knowledge: Good  Language: Good   Psychomotor Activity  Psychomotor Activity: Psychomotor Activity: Normal   Assets  Assets: Communication Skills; Desire for Improvement; Financial Resources/Insurance; Location Manager;  Vocational/Educational; Resilience; Social Support; Physical Health   Sleep  Sleep: Sleep: Fair  Estimated Sleeping Duration (Last 24 Hours): 0.00 hours  Nutritional Assessment (For OBS and FBC admissions only) Has the patient had a weight loss or gain of 10 pounds or more in the last 3 months?: No Has the patient had a decrease in food intake/or appetite?: No Does the patient have dental problems?: No Does the patient have eating habits or behaviors that may be indicators of an eating disorder including binging or inducing vomiting?: No Has the patient recently lost weight without trying?: 0 Has the patient been eating poorly because of a decreased appetite?: 0 Malnutrition Screening Tool Score: 0    Physical Exam  Physical Exam Vitals and nursing note reviewed.  Constitutional:      Appearance: Normal appearance. She is well-developed.  HENT:     Head: Normocephalic and atraumatic.     Nose: Nose normal.  Cardiovascular:     Rate and Rhythm: Normal rate.  Pulmonary:     Effort: Pulmonary effort is normal.  Musculoskeletal:        General: Normal range of motion.     Cervical back: Normal range of motion.  Skin:    General: Skin is warm and dry.  Neurological:     Mental Status: She is alert and oriented to person, place, and time.  Psychiatric:        Attention and Perception: Attention and perception normal.        Mood and Affect: Affect normal. Mood is anxious.        Speech: Speech normal.        Behavior: Behavior normal. Behavior is cooperative.        Thought Content: Thought content normal.        Cognition and Memory: Cognition and memory normal.    Review of Systems  Constitutional: Negative.   HENT: Negative.    Eyes: Negative.   Respiratory: Negative.    Cardiovascular: Negative.   Gastrointestinal: Negative.   Genitourinary: Negative.   Musculoskeletal: Negative.   Skin: Negative.   Neurological: Negative.   Psychiatric/Behavioral:  The  patient is nervous/anxious.    Blood pressure 122/87, pulse 67, temperature  98.4 F (36.9 C), temperature source Oral, resp. rate 18, SpO2 97%. There is no height or weight on file to calculate BMI.  Demographic Factors:  NA  Loss Factors: NA  Historical Factors: Prior suicide attempts  Risk Reduction Factors:   Responsible for children under 1 years of age, Sense of responsibility to family, Religious beliefs about death, Employed, Living with another person, especially a relative, Positive social support, Positive therapeutic relationship, and Positive coping skills or problem solving skills  Continued Clinical Symptoms:  Hx previous mental health diagnoses and treatment.  Cognitive Features That Contribute To Risk:  None    Suicide Risk:  Minimal: No identifiable suicidal ideation.  Patients presenting with no risk factors but with morbid ruminations; may be classified as minimal risk based on the severity of the depressive symptoms  Plan Of Care/Follow-up recommendations:  Follow-up with established outpatient psychiatry provider, Dr. Rockey Law.  Appointment scheduled 01/26/2024. Follow-up with outpatient individual counseling, resources provided including [facility].  Continue current medications: -Gabapentin  300 mg nightly -Hydroxyzine  25 mg 3 times daily as needed/anxiety -Lamotrigine  50 mg nightly/mood -Organic iron tablet OTC 1 tablet daily -Organic vitamin D3 tablet OTC 2 times per week on Tuesday and Thursday. -Quetiapine  100 mg nightly/mood -Trazodone  25 mg nightly as needed/sleep -Zaleplon 10 mg nightly as needed/sleep   Disposition: Discharge  Ellouise LITTIE Dawn, FNP 01/22/2024, 10:28 AM

## 2024-01-22 NOTE — ED Notes (Signed)
 Pt A&O x 4, presents  with noncompliance with medications.  Denies SI, HI or AVH.  Pt tearful and upset about taking oral meds.  Comfort measures given.  Monitoring for safety.

## 2024-01-22 NOTE — ED Notes (Signed)
 Pt resting quietly , sleeping long intervals without  distress or discomfort.  Safety maintained.

## 2024-01-22 NOTE — ED Notes (Addendum)
 [facility] Reported came n for chest pain, medically cleared after transported to the ER. Possible d/c later in the day. Patient tolerated the admission process well. Denied SI/HI/AVH. Made comfortable in bed. Will monitor for safety

## 2024-01-28 ENCOUNTER — Other Ambulatory Visit (HOSPITAL_COMMUNITY)
Admission: EM | Admit: 2024-01-28 | Discharge: 2024-01-28 | Disposition: A | Discharge status: Other Acute Inpatient Hospital | Source: Intra-hospital | Attending: Psychiatry | Admitting: Psychiatry

## 2024-01-28 ENCOUNTER — Ambulatory Visit (HOSPITAL_COMMUNITY)
Admission: EM | Admit: 2024-01-28 | Discharge: 2024-01-28 | Disposition: A | Discharge status: Other Acute Inpatient Hospital

## 2024-01-28 DIAGNOSIS — F319 Bipolar disorder, unspecified: Secondary | ICD-10-CM | POA: Diagnosis not present

## 2024-01-28 DIAGNOSIS — F411 Generalized anxiety disorder: Secondary | ICD-10-CM | POA: Diagnosis not present

## 2024-01-28 DIAGNOSIS — Z91148 Patient's other noncompliance with medication regimen for other reason: Secondary | ICD-10-CM

## 2024-01-28 DIAGNOSIS — F445 Conversion disorder with seizures or convulsions: Secondary | ICD-10-CM | POA: Insufficient documentation

## 2024-01-28 DIAGNOSIS — F5105 Insomnia due to other mental disorder: Secondary | ICD-10-CM | POA: Diagnosis not present

## 2024-01-28 DIAGNOSIS — F99 Mental disorder, not otherwise specified: Secondary | ICD-10-CM | POA: Diagnosis not present

## 2024-01-28 DIAGNOSIS — R448 Other symptoms and signs involving general sensations and perceptions: Secondary | ICD-10-CM | POA: Diagnosis not present

## 2024-01-28 DIAGNOSIS — T43596A Underdosing of other antipsychotics and neuroleptics, initial encounter: Secondary | ICD-10-CM | POA: Insufficient documentation

## 2024-01-28 DIAGNOSIS — F314 Bipolar disorder, current episode depressed, severe, without psychotic features: Secondary | ICD-10-CM | POA: Insufficient documentation

## 2024-01-28 DIAGNOSIS — F4310 Post-traumatic stress disorder, unspecified: Secondary | ICD-10-CM | POA: Insufficient documentation

## 2024-01-28 LAB — CBC WITH DIFFERENTIAL/PLATELET
Abs Immature Granulocytes: 0 K/uL (ref 0.00–0.07)
Basophils Absolute: 0 K/uL (ref 0.0–0.1)
Basophils Relative: 1 %
Eosinophils Absolute: 0 K/uL (ref 0.0–0.5)
Eosinophils Relative: 1 %
HCT: 41.7 % (ref 36.0–46.0)
Hemoglobin: 13.8 g/dL (ref 12.0–15.0)
Immature Granulocytes: 0 %
Lymphocytes Relative: 49 %
Lymphs Abs: 1.7 K/uL (ref 0.7–4.0)
MCH: 30.8 pg (ref 26.0–34.0)
MCHC: 33.1 g/dL (ref 30.0–36.0)
MCV: 93.1 fL (ref 80.0–100.0)
Monocytes Absolute: 0.3 K/uL (ref 0.1–1.0)
Monocytes Relative: 7 %
Neutro Abs: 1.5 K/uL — ABNORMAL LOW (ref 1.7–7.7)
Neutrophils Relative %: 42 %
Platelets: 312 K/uL (ref 150–400)
RBC: 4.48 MIL/uL (ref 3.87–5.11)
RDW: 13.2 % (ref 11.5–15.5)
WBC: 3.5 K/uL — ABNORMAL LOW (ref 4.0–10.5)
nRBC: 0 % (ref 0.0–0.2)

## 2024-01-28 LAB — POCT URINE DRUG SCREEN - MANUAL ENTRY (I-SCREEN)
POC Amphetamine UR: NOT DETECTED
POC Buprenorphine (BUP): NOT DETECTED
POC Cocaine UR: NOT DETECTED
POC Marijuana UR: NOT DETECTED
POC Methadone UR: NOT DETECTED
POC Methamphetamine UR: NOT DETECTED
POC Morphine: NOT DETECTED
POC Oxazepam (BZO): NOT DETECTED
POC Oxycodone UR: NOT DETECTED
POC Secobarbital (BAR): NOT DETECTED

## 2024-01-28 LAB — COMPREHENSIVE METABOLIC PANEL WITH GFR
ALT: 7 U/L (ref 0–44)
AST: 20 U/L (ref 15–41)
Albumin: 4.6 g/dL (ref 3.5–5.0)
Alkaline Phosphatase: 50 U/L (ref 38–126)
Anion gap: 15 (ref 5–15)
BUN: 15 mg/dL (ref 6–20)
CO2: 25 mmol/L (ref 22–32)
Calcium: 9.9 mg/dL (ref 8.9–10.3)
Chloride: 100 mmol/L (ref 98–111)
Creatinine, Ser: 0.95 mg/dL (ref 0.44–1.00)
GFR, Estimated: >60 mL/min (ref >60)
Glucose, Bld: 97 mg/dL (ref 70–99)
Potassium: 3.7 mmol/L (ref 3.5–5.1)
Sodium: 140 mmol/L (ref 135–145)
Total Bilirubin: 0.9 mg/dL (ref 0.0–1.2)
Total Protein: 7.4 g/dL (ref 6.5–8.1)

## 2024-01-28 LAB — POC URINE PREG, ED: Preg Test, Ur: NEGATIVE

## 2024-01-28 MED ORDER — HALOPERIDOL 5 MG PO TABS: 5.0000 mg | ORAL_TABLET | Freq: Once | ORAL | Status: DC

## 2024-01-28 MED ORDER — DIPHENHYDRAMINE HCL 25 MG PO CAPS: 25.0000 mg | ORAL_CAPSULE | Freq: Once | ORAL | Status: AC

## 2024-01-28 MED ORDER — DIPHENHYDRAMINE HCL 50 MG PO CAPS: 50.0000 mg | ORAL_CAPSULE | Freq: Once | ORAL | Status: DC

## 2024-01-28 MED ORDER — DIPHENHYDRAMINE HCL 50 MG PO CAPS
50.0000 mg | ORAL_CAPSULE | Freq: Three times a day (TID) | ORAL | Status: DC | PRN
  Administered 2024-01-28: 10:00:00 50 mg via ORAL
  Filled 2024-01-28: qty 1

## 2024-01-28 MED ORDER — SERTRALINE HCL 25 MG PO TABS
25.0000 mg | ORAL_TABLET | Freq: Every day | ORAL | Status: DC
  Administered 2024-01-28: 09:00:00 25 mg via ORAL
  Filled 2024-01-28: qty 1

## 2024-01-28 MED ORDER — HALOPERIDOL LACTATE 5 MG/ML IJ SOLN: 5.0000 mg | Freq: Three times a day (TID) | INTRAMUSCULAR | Status: DC | PRN

## 2024-01-28 MED ORDER — QUETIAPINE FUMARATE 100 MG PO TABS: 100.0000 mg | ORAL_TABLET | Freq: Every day | ORAL | Status: DC

## 2024-01-28 MED ORDER — HALOPERIDOL 5 MG PO TABS
5.0000 mg | ORAL_TABLET | Freq: Three times a day (TID) | ORAL | Status: DC | PRN
  Administered 2024-01-28: 10:00:00 5 mg via ORAL
  Filled 2024-01-28: qty 1

## 2024-01-28 MED ORDER — SERTRALINE HCL 25 MG PO TABS: 25.0000 mg | ORAL_TABLET | Freq: Every day | ORAL | Status: AC

## 2024-01-28 MED ORDER — HALOPERIDOL 5 MG PO TABS: 5.0000 mg | ORAL_TABLET | Freq: Once | ORAL | Status: AC

## 2024-01-28 MED ORDER — LORAZEPAM 1 MG PO TABS: 2.0000 mg | ORAL_TABLET | Freq: Once | ORAL | Status: DC

## 2024-01-28 MED ORDER — ACETAMINOPHEN 325 MG PO TABS: 650.0000 mg | ORAL_TABLET | Freq: Four times a day (QID) | ORAL | Status: DC | PRN

## 2024-01-28 MED ORDER — LORAZEPAM 2 MG/ML IJ SOLN: 2.0000 mg | Freq: Three times a day (TID) | INTRAMUSCULAR | Status: DC | PRN

## 2024-01-28 MED ORDER — DIPHENHYDRAMINE HCL 50 MG/ML IJ SOLN: 50.0000 mg | Freq: Three times a day (TID) | INTRAMUSCULAR | Status: DC | PRN

## 2024-01-28 MED ORDER — LORAZEPAM 2 MG/ML IJ SOLN
2.0000 mg | Freq: Three times a day (TID) | INTRAMUSCULAR | Status: DC | PRN
  Filled 2024-01-28: qty 1

## 2024-01-28 MED ORDER — HALOPERIDOL LACTATE 5 MG/ML IJ SOLN
10.0000 mg | Freq: Three times a day (TID) | INTRAMUSCULAR | Status: DC | PRN
  Filled 2024-01-28: qty 2

## 2024-01-28 MED ORDER — MAGNESIUM HYDROXIDE 400 MG/5ML PO SUSP: 30.0000 mL | Freq: Every day | ORAL | Status: DC | PRN

## 2024-01-28 MED ORDER — ALUM & MAG HYDROXIDE-SIMETH 200-200-20 MG/5ML PO SUSP: 30.0000 mL | ORAL | Status: DC | PRN

## 2024-01-28 MED ORDER — HYDROXYZINE HCL 25 MG PO TABS
25.0000 mg | ORAL_TABLET | Freq: Three times a day (TID) | ORAL | Status: DC | PRN
  Administered 2024-01-28 (×2): 25 mg via ORAL
  Filled 2024-01-28 (×2): qty 1

## 2024-01-28 MED ORDER — DIPHENHYDRAMINE HCL 50 MG/ML IJ SOLN
50.0000 mg | Freq: Three times a day (TID) | INTRAMUSCULAR | Status: DC | PRN
  Filled 2024-01-28: qty 1

## 2024-01-28 NOTE — Discharge Instructions (Addendum)
 Transfer the patient to the higher level of mental health care at the [facility] [place].

## 2024-01-28 NOTE — ED Notes (Signed)
 Pt informed that she was being transferred to [facility] for continued care. Ppt verbalized understanding and agreement. Safety maintained.

## 2024-01-28 NOTE — ED Notes (Addendum)
 Patient transferred to [facility] via safe transport. Pt continued to have hyper-religious preoccupation but able to be redirected.  Pt belongings given to safe transport driver from locker #81 intact. Patient escorted to [place] via staff for transport to destination. Safety maintained.

## 2024-01-28 NOTE — ED Notes (Signed)
 43 Y/O female admitted to [facility] with medication non compliance.  Hyper-religious upon admission  noted mood lability , preoccupied with Bible scriptures, verbalizing that he relationship with God is off.  Pt encouraged to verbalize thoughts and then redirected.  Skin assessment completed noting bilateral mastectomy scar and bilateral flat foot repair , and one left foot bunionectomy. Pt given hydrozyzine 25 mg po for anxiety.  Will maintain safety.
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
## 2024-01-28 NOTE — Care Management (Signed)
 [facility] Care Management...  Writer met with the client to discuss updates and discharge planning.  Client reports she was recently here but reports she starts to get the jitters.  Client reports she's constantly battling between her faith and mental health.  Client denies substance use but reports she doesn't take her medications because it goes against her beliefs.  Client reports her medication will typically start off good and then she feels stable enough to function without them so she stops.    Client reports the supply she had she threw them away and her jitters have become so bad she decided to come back to get help.  Client reported no SI/HI and or plan today.  Client also report no auditory and or visual hallucinations.    Approximately 15 minutes after meeting with the client she began making loud noises and jerking movements.  Client was observed screaming and jerking down the hallway.  The providers assessed her contacted [facility] for ongoing services.  Client will be transferred to [facility] today.

## 2024-01-28 NOTE — ED Notes (Signed)
 Patient denies pain and is resting comfortably.

## 2024-01-28 NOTE — ED Notes (Signed)
 Safe transport called to take pt to [facility].

## 2024-01-28 NOTE — ED Provider Notes (Cosign Needed)
 FBC/OBS ASAP Discharge Summary  Date and Time: 01/28/2024 1:52 PM  Name: Casey Lang  MRN:  [id number]   Discharge Diagnoses:  Final diagnoses:  Bipolar 1 disorder, depressed, severe (HCC)  Non compliance w medication regimen  Insomnia due to other mental disorder   HPI: Per admissions assessment: Casey Lang is a 43 year old female with psychiatric history of GAD, bipolar 1 disorder, conversion disorder with attacks of seizures, depression, PTSD, and insomnia, who presented voluntarily as a walk-in to [facility] with complaints of insomnia, racing thoughts and medication non-compliance.   Per chart review, patient was discharged from the [facility] on 12/11 after an overnight stay , and has had >7 ER visits in a 5 month period of time with physical complaints which are typically ruled out. She has a pattern of also presenting with psychosis, and as per chart review, patient is chronically medication non compliant.   Stay Summary: Patient reported to admitting provider overnight that she has been prescribed with multiple medications including Seroquel , Lamictal , Invega , Risperdal, Ambien , amongst others, but typically does not take the medications long enough to experience therapeutic effects from such medications.  When she presented overnight, she was markedly psychotic; talked about going to church for deliverance, reported insomnia and inability to sleep x 1 week.  This is patient's second presentation to this facility within a 1 week timeframe.  When patient presented on 12/11, she was sent to the ER for clearance as she was holding onto her chest, localizing to area, stating that she was in a significant amount of pain.  Assessment prior to determination for inpatient hospitalization: Assessment completed by writer earlier today morning, with patient in her room, waddling in bed, dry heaving, stating that she is trying to get rid of the demon that is in her.  Patient screaming and  hollering out loudly, and disruptive to the milieu, talked about being possessed by the devil.  Repeatedly stated: I am demonic ma'am. Stated that medicine is sorcery when it was being given to her. Patient was medicated with Haldol  5 mg as well as Benadryl  25 mg PO due to her agitation and inability to deescalate.  Patient was recommended for inpatient behavioral health hospitalization as her symptoms are too acute for management on the [facility]. She was accepted by [facility] for treatment and stabilization and agreeable to go on a voluntary basis. She denies SI, denies HI, denies AVH, but presenting with tactile hallucinations-states she can feel a demon in her, and presenting with a delusion that there is a demon inside of her. It is the consensus of her treatment team that she will benefit from the higher level of care at the [facility].  Total Time spent with patient: 1 hour  Past Psychiatric History: Reports bipolar, but poor historian at this time. Past Medical History: denies  Family History: denies  Family Psychiatric History: Mult ER presentations, and multiple psychotropic med trials as listed above Social History: Resides with mother and has a 40 yr old son, denies substance use. UDS is negative. Tobacco Cessation:  N/A, patient does not currently use tobacco products  Current Medications:  Current Facility-Administered Medications  Medication Dose Route Frequency Provider Last Rate Last Admin   acetaminophen  (TYLENOL ) tablet 650 mg  650 mg Oral Q6H PRN Onuoha, Chinwendu V, NP       alum & mag hydroxide-simeth (MAALOX/MYLANTA) 200-200-20 MG/5ML suspension 30 mL  30 mL Oral Q4H PRN Onuoha, Chinwendu V,  NP       haloperidol  (HALDOL ) tablet 5 mg  5 mg Oral TID PRN Onuoha, Chinwendu V, NP   5 mg at 01/28/24 0935   And   diphenhydrAMINE  (BENADRYL ) capsule 50 mg  50 mg Oral TID PRN Onuoha, Chinwendu V, NP   50 mg at 01/28/24  0935   diphenhydrAMINE  (BENADRYL ) injection 50 mg  50 mg Intramuscular TID PRN Onuoha, Chinwendu V, NP       And   LORazepam  (ATIVAN ) injection 2 mg  2 mg Intramuscular TID PRN Onuoha, Chinwendu V, NP       haloperidol  lactate (HALDOL ) injection 10 mg  10 mg Intramuscular TID PRN Onuoha, Chinwendu V, NP       hydrOXYzine  (ATARAX ) tablet 25 mg  25 mg Oral TID PRN Onuoha, Chinwendu V, NP   25 mg at 01/28/24 0900   magnesium  hydroxide (MILK OF MAGNESIA) suspension 30 mL  30 mL Oral Daily PRN Onuoha, Chinwendu V, NP       QUEtiapine  (SEROQUEL ) tablet 100 mg  100 mg Oral QHS Onuoha, Chinwendu V, NP       sertraline  (ZOLOFT ) tablet 25 mg  25 mg Oral Daily Onuoha, Chinwendu V, NP   25 mg at 01/28/24 0900   Current Outpatient Medications  Medication Sig Dispense Refill   QUEtiapine  (SEROQUEL ) 100 MG tablet Take 100 mg by mouth at bedtime.     traZODone  (DESYREL ) 50 MG tablet Take 0.5 tablets (25 mg total) by mouth at bedtime as needed for sleep. 30 tablet 0   [START ON 01/29/2024] sertraline  (ZOLOFT ) 25 MG tablet Take 1 tablet (25 mg total) by mouth daily.      PTA Medications:  Facility Ordered Medications  Medication   acetaminophen  (TYLENOL ) tablet 650 mg   alum & mag hydroxide-simeth (MAALOX/MYLANTA) 200-200-20 MG/5ML suspension 30 mL   magnesium  hydroxide (MILK OF MAGNESIA) suspension 30 mL   haloperidol  (HALDOL ) tablet 5 mg   And   diphenhydrAMINE  (BENADRYL ) capsule 50 mg   diphenhydrAMINE  (BENADRYL ) injection 50 mg   And   LORazepam  (ATIVAN ) injection 2 mg   haloperidol  lactate (HALDOL ) injection 10 mg   hydrOXYzine  (ATARAX ) tablet 25 mg   QUEtiapine  (SEROQUEL ) tablet 100 mg   sertraline  (ZOLOFT ) tablet 25 mg   [COMPLETED] diphenhydrAMINE  (BENADRYL ) capsule 25 mg   And   [COMPLETED] haloperidol  (HALDOL ) tablet 5 mg   PTA Medications  Medication Sig   traZODone  (DESYREL ) 50 MG tablet Take 0.5 tablets (25 mg total) by mouth at bedtime as needed for sleep.   QUEtiapine  (SEROQUEL )  100 MG tablet Take 100 mg by mouth at bedtime.   [START ON 01/29/2024] sertraline  (ZOLOFT ) 25 MG tablet Take 1 tablet (25 mg total) by mouth daily.       01/28/2024    3:41 AM 01/22/2024   11:41 AM 07/17/2021   10:30 AM  Depression screen PHQ 2/9  Decreased Interest 2 0 0  Down, Depressed, Hopeless 2 0 0  PHQ - 2 Score 4 0 0  Altered sleeping 3  0  Tired, decreased energy 1  0  Change in appetite 3  0  Feeling bad or failure about yourself  2  0  Trouble concentrating 2  0  Moving slowly or fidgety/restless 0  0  Suicidal thoughts 0  0  PHQ-9 Score 15  0   Difficult doing work/chores Very difficult       Data saved with a previous flowsheet row definition    Flowsheet  Row ED from 01/28/2024 in [facility] Most recent reading at 01/28/2024  5:11 AM ED from 01/28/2024 in [facility] Most recent reading at 01/28/2024  2:37 AM ED from 01/22/2024 in [facility] Most recent reading at 01/22/2024  5:56 AM  C-SSRS RISK CATEGORY No Risk No Risk No Risk    Musculoskeletal  Strength & Muscle Tone: within normal limits Gait & Station: normal Patient leans: N/A  Psychiatric Specialty Exam  Presentation  General Appearance:  Casual  Eye Contact: Fair  Speech: Clear and Coherent  Speech Volume: Normal  Handedness: Right   Mood and Affect  Mood: Depressed; Anxious  Affect: Congruent   Thought Process  Thought Processes: Coherent  Descriptions of Associations:Intact  Orientation:Full (Time, Place and Person)  Thought Content:Illogical; WDL  Diagnosis of Schizophrenia or Schizoaffective disorder in past: No    Hallucinations:Hallucinations: None  Ideas of Reference:None  Suicidal Thoughts:Suicidal Thoughts: No  Homicidal Thoughts:Homicidal Thoughts: No   Sensorium  Memory: Immediate Fair  Judgment: Fair  Insight: Fair   Art Therapist   Concentration: Fair  Attention Span: Fair  Recall: Fair  Fund of Knowledge: Poor  Language: Fair   Psychomotor Activity  Psychomotor Activity: Psychomotor Activity: Normal   Assets  Assets: Resilience   Sleep  Sleep: Sleep: Poor  Estimated Sleeping Duration (Last 24 Hours): 0.00 hours  Nutritional Assessment (For OBS and FBC admissions only) Has the patient had a weight loss or gain of 10 pounds or more in the last 3 months?: No Has the patient had a decrease in food intake/or appetite?: No Does the patient have dental problems?: No Does the patient have eating habits or behaviors that may be indicators of an eating disorder including binging or inducing vomiting?: No Has the patient recently lost weight without trying?: 0 Has the patient been eating poorly because of a decreased appetite?: 0 Malnutrition Screening Tool Score: 0    Physical Exam  Physical Exam Vitals reviewed.  Psychiatric:        Behavior: Behavior normal.    Review of Systems  Psychiatric/Behavioral:  Positive for depression and hallucinations. Negative for memory loss, substance abuse and suicidal ideas. The patient is nervous/anxious and has insomnia.   All other systems reviewed and are negative.  Blood pressure 106/71, pulse 74, temperature 98.5 F (36.9 C), resp. rate 17, SpO2 98%. There is no height or weight on file to calculate BMI.  Demographic Factors:  Low socioeconomic status  Loss Factors: Financial problems/change in socioeconomic status  Historical Factors: Family history of mental illness or substance abuse  Risk Reduction Factors:   Sense of responsibility to family and Living with another person, especially a relative  Continued Clinical Symptoms:  More than one psychiatric diagnosis Currently Psychotic  Cognitive Features That Contribute To Risk:  Thought constriction (tunnel vision)    Suicide Risk:  Denies SI, but risk can be deemed moderate due to  other factors such as low socioeconomic status, history of mental illness, limited social support, etc.  Plan Of Care/Follow-up recommendations:  Transferring to inpatient Behavioral Health at [facility], NP 01/28/2024, 1:52 PM

## 2024-01-28 NOTE — ED Provider Notes (Signed)
 Facility Based Crisis Admission H&P  Date: 01/28/2024 Patient Name: Casey Lang MRN: [id number] Chief Complaint:  I have been on/off my medications.   Diagnoses:  Final diagnoses:  Bipolar 1 disorder, depressed, severe (HCC)  Non compliance w medication regimen  Insomnia due to other mental disorder    HPI: Casey Lang is a 43 year old female with psychiatric history of GAD, bipolar 1 disorder, conversion disorder with attacks of seizures, depression, PTSD, and insomnia, who presented voluntarily as a walk-in to [facility] with complaints of insomnia, racing thoughts and medication non-compliance.   Patient was seen face to face by this provider and chart reviewed. Per chart review, patient was recently discharged from the [facility] 12/11 after overnight stay. Patient has had 7 ED/UC visits within 6 months for medical and psychiatric complaints.  Patient reports after she was discharged home on Friday, I began jerking around and I renounced the medications, I said  Lord I want to be healed, I then threw away all the medications, I feel foolish being here, I wanna believe in divine healing but my faith is not there yet.   Patient reports she has been non-compliant with her medications and was prescribed Seroquel , Lamictal  and a blue pill for sleep by her Telepsych provider at [facility].  Patient reports being diagnosed with bipolar 1 disorder at age 68 and has trialed several medications including Invega , Latuda, Risperdal, Ambien  and several others she is unable to recall.  Patient reports in May this year, she was prescribed Seroquel  125 mg which was very effective but then she thought she did not need the medication and stopped taking it.   Patient reports experiencing insomnia for about a week after stopping and then going for private deliverance at the church and also performing self deliverance because in my mind, I had different thoughts,....it varies, I tried  to think on the word of God and prayed at midnight, just pacing and trying to grasp on the Lord believing I could be free and I've not been eating much, I pray that God will help me.   Patient reports she last took her medications Thursday last week and wants to get back on the Seroquel .  Patient reports reaching out to [place] counseling group for consultation but the provider did not show up.  Patient reports being employed and currently living with her 39-year-old son.  She describes her current mental health as challenging, juggling her job and responsibilities.  She denies illicit substance use.  On evaluation, patient is alert, oriented x 3, and cooperative. Speech is clear, slow and coherent. Pt appears appropriately dressed. Eye contact is poor. Mood is anxious and depressed, affect is flat and congruent with mood. Thought process is coherent and thought content is WDL. Pt denies SI/HI/AVH. There is no objective indication that the patient is responding to internal stimuli. No delusions elicited during this assessment.    Patient completed the PHQ-9 questionnaire and obtained a total score of 15, indicating moderately severe depression.    Discussed recommendation for admission to [facility] for stabilization and treatment.  Discussed restarting Seroquel  per patient's request and addition of Zoloft  25 mg p.o. daily for depressive and anxiety symptoms.  Patient education provided on medication risks, benefits and alternatives to treatment. Patient is provided with opportunity for questions. She verbalized understanding and is in agreement.   PHQ 2-9:  Flowsheet Row ED from 01/28/2024 in [facility] Office Visit from 07/17/2021 in [facility] for [facility]  at [facility]  Thoughts that you would be better off dead, or of hurting yourself in some way Not at all Not at all  PHQ-9 Total Score 15 0    Flowsheet Row ED from 01/28/2024 in [facility] ED from 01/22/2024 in [facility] ED from 01/21/2024 in [facility]  C-SSRS RISK CATEGORY No Risk No Risk No Risk      Total Time spent with patient: 30 minutes  Musculoskeletal  Strength & Muscle Tone: within normal limits Gait & Station: normal Patient leans: N/A  Psychiatric Specialty Exam  Presentation General Appearance:  Appropriate for Environment  Eye Contact: Poor  Speech: Clear and Coherent; Slow  Speech Volume: Decreased  Handedness: Right   Mood and Affect  Mood: Anxious; Depressed  Affect: Congruent; Tearful   Thought Process  Thought Processes: Coherent  Descriptions of Associations:Intact  Orientation:Full (Time, Place and Person)  Thought Content:Perseveration  Diagnosis of Schizophrenia or Schizoaffective disorder in past: No   Hallucinations:Hallucinations: None  Ideas of Reference:Other (comment) (Religiosity)  Suicidal Thoughts:Suicidal Thoughts: No  Homicidal Thoughts:Homicidal Thoughts: No   Sensorium  Memory: Immediate Fair  Judgment: Poor  Insight: Shallow   Executive Functions  Concentration: Fair  Attention Span: Fair  Recall: Fair  Fund of Knowledge: Fair  Language: Fair   Psychomotor Activity  Psychomotor Activity: Psychomotor Activity: Normal   Assets  Assets: Communication Skills; Desire for Improvement   Sleep  Sleep: Sleep: Poor   Nutritional Assessment (For OBS and FBC admissions only) Has the patient had a weight loss or gain of 10 pounds or more in the last 3 months?: No Has the patient had a decrease in food intake/or appetite?: Yes Does the patient have dental problems?: No Does the patient have eating habits or behaviors that may be indicators of an eating disorder including binging or inducing vomiting?: No Has the patient recently lost weight without trying?: 0 Has the patient been eating  poorly because of a decreased appetite?: 1 Malnutrition Screening Tool Score: 1    Physical Exam Constitutional:      General: She is not in acute distress.    Appearance: She is not diaphoretic.  HENT:     Nose: No congestion.  Neurological:     Mental Status: She is alert and oriented to person, place, and time.  Psychiatric:        Attention and Perception: Attention and perception normal.        Mood and Affect: Mood is anxious and depressed. Affect is tearful.        Speech: Speech normal.        Behavior: Behavior is cooperative.    Review of Systems  Constitutional:  Negative for chills, diaphoresis and fever.  HENT:  Negative for congestion.   Eyes:  Negative for discharge.  Respiratory:  Negative for cough, shortness of breath and wheezing.   Cardiovascular:  Negative for chest pain and palpitations.  Gastrointestinal:  Negative for diarrhea, nausea and vomiting.  Neurological:  Negative for dizziness, seizures, loss of consciousness and headaches.  Psychiatric/Behavioral:  Positive for depression. The patient is nervous/anxious and has insomnia.    Past Psychiatric History: See H & P   Is the patient at risk to self? Yes  Has the patient been a risk to self in the past 6 months? Yes .    Has the patient been a risk to self within the distant past? Yes   Is  the patient a risk to others? No   Has the patient been a risk to others in the past 6 months? No   Has the patient been a risk to others within the distant past? No   Past Medical History: See Chart Family History: N/A Social History: N/A  Last Labs:  Admission on 01/28/2024  Component Date Value Ref Range Status   Preg Test, Ur 01/28/2024 Negative  Negative Final   POC Amphetamine UR 01/28/2024 None Detected  NONE DETECTED (Cut Off Level 1000 ng/mL) Final   POC Secobarbital (BAR) 01/28/2024 None Detected  NONE DETECTED (Cut Off Level 300 ng/mL) Final   POC Buprenorphine (BUP) 01/28/2024 None Detected   NONE DETECTED (Cut Off Level 10 ng/mL) Final   POC Oxazepam (BZO) 01/28/2024 None Detected  NONE DETECTED (Cut Off Level 300 ng/mL) Final   POC Cocaine UR 01/28/2024 None Detected  NONE DETECTED (Cut Off Level 300 ng/mL) Final   POC Methamphetamine UR 01/28/2024 None Detected  NONE DETECTED (Cut Off Level 1000 ng/mL) Final   POC Morphine 01/28/2024 None Detected  NONE DETECTED (Cut Off Level 300 ng/mL) Final   POC Methadone UR 01/28/2024 None Detected  NONE DETECTED (Cut Off Level 300 ng/mL) Final   POC Oxycodone  UR 01/28/2024 None Detected  NONE DETECTED (Cut Off Level 100 ng/mL) Final   POC Marijuana UR 01/28/2024 None Detected  NONE DETECTED (Cut Off Level 50 ng/mL) Final  Admission on 01/21/2024, Discharged on 01/22/2024  Component Date Value Ref Range Status   WBC 01/21/2024 3.5 (L)  4.0 - 10.5 K/uL Final   RBC 01/21/2024 4.15  3.87 - 5.11 MIL/uL Final   Hemoglobin 01/21/2024 12.6  12.0 - 15.0 g/dL Final   HCT 87/89/7974 38.7  36.0 - 46.0 % Final   MCV 01/21/2024 93.3  80.0 - 100.0 fL Final   MCH 01/21/2024 30.4  26.0 - 34.0 pg Final   MCHC 01/21/2024 32.6  30.0 - 36.0 g/dL Final   RDW 87/89/7974 13.2  11.5 - 15.5 % Final   Platelets 01/21/2024 295  150 - 400 K/uL Final   nRBC 01/21/2024 0.0  0.0 - 0.2 % Final   Neutrophils Relative % 01/21/2024 49  % Final   Neutro Abs 01/21/2024 1.7  1.7 - 7.7 K/uL Final   Lymphocytes Relative 01/21/2024 41  % Final   Lymphs Abs 01/21/2024 1.4  0.7 - 4.0 K/uL Final   Monocytes Relative 01/21/2024 8  % Final   Monocytes Absolute 01/21/2024 0.3  0.1 - 1.0 K/uL Final   Eosinophils Relative 01/21/2024 1  % Final   Eosinophils Absolute 01/21/2024 0.0  0.0 - 0.5 K/uL Final   Basophils Relative 01/21/2024 1  % Final   Basophils Absolute 01/21/2024 0.0  0.0 - 0.1 K/uL Final   Immature Granulocytes 01/21/2024 0  % Final   Abs Immature Granulocytes 01/21/2024 0.01  0.00 - 0.07 K/uL Final   Performed at [facility], [address],  [place], [place] [address]   Sodium 01/21/2024 139  135 - 145 mmol/L Final   Potassium 01/21/2024 3.7  3.5 - 5.1 mmol/L Final   Chloride 01/21/2024 101  98 - 111 mmol/L Final   CO2 01/21/2024 26  22 - 32 mmol/L Final   Glucose, Bld 01/21/2024 86  70 - 99 mg/dL Final   Glucose reference range applies only to samples taken after fasting for at least 8 hours.   BUN 01/21/2024 16  6 - 20 mg/dL Final  Creatinine, Ser 01/21/2024 0.90  0.44 - 1.00 mg/dL Final   Calcium 87/89/7974 9.2  8.9 - 10.3 mg/dL Final   Total Protein 87/89/7974 6.7  6.5 - 8.1 g/dL Final   Albumin 87/89/7974 3.9  3.5 - 5.0 g/dL Final   AST 87/89/7974 11 (L)  15 - 41 U/L Final   ALT 01/21/2024 7  0 - 44 U/L Final   Alkaline Phosphatase 01/21/2024 41  38 - 126 U/L Final   Total Bilirubin 01/21/2024 1.4 (H)  0.0 - 1.2 mg/dL Final   GFR, Estimated 01/21/2024 >60  >60 mL/min Final   Comment: (NOTE) Calculated using the CKD-EPI Creatinine Equation (2021)    Anion gap 01/21/2024 12  5 - 15 Final   Performed at [facility], [address] [place], [place] [address]   TSH 01/21/2024 1.224  0.350 - 4.500 uIU/mL Final   Comment: Performed by a 3rd Generation assay with a functional sensitivity of <=0.01 uIU/mL. Performed at [facility], [address], [place], [place] [address]    Alcohol, Ethyl (B) 01/21/2024 <15  <15 mg/dL Final   Comment: (NOTE) For medical purposes only. Performed at [facility], [address], [place], [place] [address]    Preg Test, Ur 01/21/2024 Negative  Negative Final   POC Amphetamine UR 01/21/2024 None Detected  NONE DETECTED (Cut Off Level 1000 ng/mL) Final   POC Secobarbital (BAR) 01/21/2024 None Detected  NONE DETECTED (Cut Off Level 300 ng/mL) Final   POC Buprenorphine (BUP) 01/21/2024 None Detected  NONE DETECTED (Cut Off Level 10 ng/mL) Final   POC Oxazepam (BZO) 01/21/2024 None Detected  NONE DETECTED (Cut Off Level 300 ng/mL) Final   POC Cocaine UR 01/21/2024 None  Detected  NONE DETECTED (Cut Off Level 300 ng/mL) Final   POC Methamphetamine UR 01/21/2024 None Detected  NONE DETECTED (Cut Off Level 1000 ng/mL) Final   POC Morphine 01/21/2024 None Detected  NONE DETECTED (Cut Off Level 300 ng/mL) Final   POC Methadone UR 01/21/2024 None Detected  NONE DETECTED (Cut Off Level 300 ng/mL) Final   POC Oxycodone  UR 01/21/2024 None Detected  NONE DETECTED (Cut Off Level 100 ng/mL) Final   POC Marijuana UR 01/21/2024 None Detected  NONE DETECTED (Cut Off Level 50 ng/mL) Final  Admission on 01/21/2024, Discharged on 01/21/2024  Component Date Value Ref Range Status   Sodium 01/21/2024 138  135 - 145 mmol/L Final   Potassium 01/21/2024 3.6  3.5 - 5.1 mmol/L Final   Chloride 01/21/2024 105  98 - 111 mmol/L Final   CO2 01/21/2024 25  22 - 32 mmol/L Final   Glucose, Bld 01/21/2024 86  70 - 99 mg/dL Final   Glucose reference range applies only to samples taken after fasting for at least 8 hours.   BUN 01/21/2024 15  6 - 20 mg/dL Final   Creatinine, Ser 01/21/2024 0.80  0.44 - 1.00 mg/dL Final   Calcium 87/89/7974 8.5 (L)  8.9 - 10.3 mg/dL Final   GFR, Estimated 01/21/2024 >60  >60 mL/min Final   Comment: (NOTE) Calculated using the CKD-EPI Creatinine Equation (2021)    Anion gap 01/21/2024 8  5 - 15 Final   Performed at [facility], [address] [place], [place] [address]   WBC 01/21/2024 2.9 (L)  4.0 - 10.5 K/uL Final   RBC 01/21/2024 3.88  3.87 - 5.11 MIL/uL Final   Hemoglobin 01/21/2024 12.1  12.0 - 15.0 g/dL Final  HCT 01/21/2024 36.8  36.0 - 46.0 % Final   MCV 01/21/2024 94.8  80.0 - 100.0 fL Final   MCH 01/21/2024 31.2  26.0 - 34.0 pg Final   MCHC 01/21/2024 32.9  30.0 - 36.0 g/dL Final   RDW 87/89/7974 13.2  11.5 - 15.5 % Final   Platelets 01/21/2024 266  150 - 400 K/uL Final   nRBC 01/21/2024 0.0  0.0 - 0.2 % Final   Performed at [facility], [address], [place], [place] [address]   Troponin I (High Sensitivity) 01/21/2024 <2   <18 ng/L Final   Comment: (NOTE) Elevated high sensitivity troponin I (hsTnI) values and significant  changes across serial measurements may suggest ACS but many other  chronic and acute conditions are known to elevate hsTnI results.  Refer to the Links section for chest pain algorithms and additional  guidance. Performed at [facility], [address], [place], [place] [address]    Preg, Serum 01/21/2024 NEGATIVE  NEGATIVE Final   Comment:        THE SENSITIVITY OF THIS METHODOLOGY IS >10 mIU/mL. Performed at [facility], [address], [place], [place] [address]   Admission on 11/04/2023, Discharged on 11/07/2023  Component Date Value Ref Range Status   Glucose-Capillary 11/06/2023 66 (L)  70 - 99 mg/dL Final   Glucose reference range applies only to samples taken after fasting for at least 8 hours.   Glucose-Capillary 11/06/2023 94  70 - 99 mg/dL Final   Glucose reference range applies only to samples taken after fasting for at least 8 hours.  Admission on 11/04/2023, Discharged on 11/04/2023  Component Date Value Ref Range Status   Sodium 11/04/2023 140  135 - 145 mmol/L Final   Potassium 11/04/2023 3.9  3.5 - 5.1 mmol/L Final   Chloride 11/04/2023 104  98 - 111 mmol/L Final   CO2 11/04/2023 25  22 - 32 mmol/L Final   Glucose, Bld 11/04/2023 83  70 - 99 mg/dL Final   Glucose reference range applies only to samples taken after fasting for at least 8 hours.   BUN 11/04/2023 16  6 - 20 mg/dL Final   Creatinine, Ser 11/04/2023 0.88  0.44 - 1.00 mg/dL Final   Calcium 90/76/7974 9.3  8.9 - 10.3 mg/dL Final   Total Protein 90/76/7974 6.5  6.5 - 8.1 g/dL Final   Albumin 90/76/7974 4.0  3.5 - 5.0 g/dL Final   AST 90/76/7974 12 (L)  15 - 41 U/L Final   ALT 11/04/2023 7  0 - 44 U/L Final   Alkaline Phosphatase 11/04/2023 33 (L)  38 - 126 U/L Final   Total Bilirubin 11/04/2023 1.3 (H)  0.0 - 1.2 mg/dL Final   GFR, Estimated 11/04/2023 >60  >60 mL/min Final   Comment:  (NOTE) Calculated using the CKD-EPI Creatinine Equation (2021)    Anion gap 11/04/2023 11  5 - 15 Final   Performed at [facility], [address], [place], [place] [address]   WBC 11/04/2023 2.8 (L)  4.0 - 10.5 K/uL Final   RBC 11/04/2023 4.20  3.87 - 5.11 MIL/uL Final   Hemoglobin 11/04/2023 12.8  12.0 - 15.0 g/dL Final   HCT 90/76/7974 39.5  36.0 - 46.0 % Final   MCV 11/04/2023 94.0  80.0 - 100.0 fL Final   MCH 11/04/2023 30.5  26.0 - 34.0 pg Final   MCHC 11/04/2023 32.4  30.0 - 36.0 g/dL Final  RDW 11/04/2023 13.1  11.5 - 15.5 % Final   Platelets 11/04/2023 233  150 - 400 K/uL Final   nRBC 11/04/2023 0.0  0.0 - 0.2 % Final   Neutrophils Relative % 11/04/2023 56  % Final   Neutro Abs 11/04/2023 1.6 (L)  1.7 - 7.7 K/uL Final   Lymphocytes Relative 11/04/2023 36  % Final   Lymphs Abs 11/04/2023 1.0  0.7 - 4.0 K/uL Final   Monocytes Relative 11/04/2023 7  % Final   Monocytes Absolute 11/04/2023 0.2  0.1 - 1.0 K/uL Final   Eosinophils Relative 11/04/2023 0  % Final   Eosinophils Absolute 11/04/2023 0.0  0.0 - 0.5 K/uL Final   Basophils Relative 11/04/2023 1  % Final   Basophils Absolute 11/04/2023 0.0  0.0 - 0.1 K/uL Final   Immature Granulocytes 11/04/2023 0  % Final   Abs Immature Granulocytes 11/04/2023 0.00  0.00 - 0.07 K/uL Final   Performed at [facility], [address], [place], [place] [address]   Hgb A1c MFr Bld 11/04/2023 4.4 (L)  4.8 - 5.6 % Final   Comment: (NOTE) Diagnosis of Diabetes The following HbA1c ranges recommended by the American Diabetes Association (ADA) may be used as an aid in the diagnosis of diabetes mellitus.  Hemoglobin             Suggested A1C NGSP%              Diagnosis  <5.7                   Non Diabetic  5.7-6.4                Pre-Diabetic  >6.4                   Diabetic  <7.0                   Glycemic control for                       adults with diabetes.     Mean Plasma Glucose 11/04/2023 79.58  mg/dL Final    Performed at [facility], [address], [place], [place] [address]   Magnesium  11/04/2023 2.3  1.7 - 2.4 mg/dL Final   Performed at [facility], [address], [place], [place] [address]   Alcohol, Ethyl (B) 11/04/2023 <15  <15 mg/dL Final   Comment: (NOTE) For medical purposes only. Performed at [facility], [address] [place], [place] [address]    Cholesterol 11/04/2023 188  0 - 200 mg/dL Final   Triglycerides 90/76/7974 118  <150 mg/dL Final   HDL 90/76/7974 47  >40 mg/dL Final   Total CHOL/HDL Ratio 11/04/2023 4.0  RATIO Final   VLDL 11/04/2023 24  0 - 40 mg/dL Final   LDL Cholesterol 11/04/2023 117 (H)  0 - 99 mg/dL Final   Comment:        Total Cholesterol/HDL:CHD Risk Coronary Heart Disease Risk Table                     Men   Women  1/2 Average Risk   3.4   3.3  Average Risk       5.0   4.4  2 X Average Risk   9.6   7.1  3 X Average Risk  23.4   11.0  Use the calculated Patient Ratio above and the CHD Risk Table to determine the patient's CHD Risk.        ATP III CLASSIFICATION (LDL):  <100     mg/dL   Optimal  899-870  mg/dL   Near or Above                    Optimal  130-159  mg/dL   Borderline  839-810  mg/dL   High  >809     mg/dL   Very High Performed at [facility], [address], [place], [place] [address]    TSH 11/04/2023 1.251  0.350 - 4.500 uIU/mL Final   Comment: Performed by a 3rd Generation assay with a functional sensitivity of <=0.01 uIU/mL. Performed at [facility], [address], [place], [place] [address]    POC Amphetamine UR 11/04/2023 None Detected  NONE DETECTED (Cut Off Level 1000 ng/mL) Final   POC Secobarbital (BAR) 11/04/2023 None Detected  NONE DETECTED (Cut Off Level 300 ng/mL) Final   POC Buprenorphine (BUP) 11/04/2023 None Detected  NONE DETECTED (Cut Off Level 10 ng/mL) Final   POC Oxazepam (BZO) 11/04/2023 None Detected  NONE DETECTED (Cut Off Level 300 ng/mL) Final   POC Cocaine  UR 11/04/2023 None Detected  NONE DETECTED (Cut Off Level 300 ng/mL) Final   POC Methamphetamine UR 11/04/2023 None Detected  NONE DETECTED (Cut Off Level 1000 ng/mL) Final   POC Morphine 11/04/2023 None Detected  NONE DETECTED (Cut Off Level 300 ng/mL) Final   POC Methadone UR 11/04/2023 None Detected  NONE DETECTED (Cut Off Level 300 ng/mL) Final   POC Oxycodone  UR 11/04/2023 None Detected  NONE DETECTED (Cut Off Level 100 ng/mL) Final   POC Marijuana UR 11/04/2023 None Detected  NONE DETECTED (Cut Off Level 50 ng/mL) Final   Prolactin 11/04/2023 9.7  4.8 - 33.4 ng/mL Final   Comment: (NOTE) Performed At: [facility] [address] [place], [place] [id number] Jennette Shorter MD Ey:[phone]     Allergies: Morphine and codeine  Medications:  Facility Ordered Medications  Medication   acetaminophen  (TYLENOL ) tablet 650 mg   alum & mag hydroxide-simeth (MAALOX/MYLANTA) 200-200-20 MG/5ML suspension 30 mL   magnesium  hydroxide (MILK OF MAGNESIA) suspension 30 mL   haloperidol  (HALDOL ) tablet 5 mg   And   diphenhydrAMINE  (BENADRYL ) capsule 50 mg   haloperidol  lactate (HALDOL ) injection 5 mg   And   diphenhydrAMINE  (BENADRYL ) injection 50 mg   And   LORazepam  (ATIVAN ) injection 2 mg   haloperidol  lactate (HALDOL ) injection 10 mg   And   diphenhydrAMINE  (BENADRYL ) injection 50 mg   And   LORazepam  (ATIVAN ) injection 2 mg   hydrOXYzine  (ATARAX ) tablet 25 mg   QUEtiapine  (SEROQUEL ) tablet 100 mg   sertraline  (ZOLOFT ) tablet 25 mg   PTA Medications  Medication Sig   traZODone  (DESYREL ) 50 MG tablet Take 0.5 tablets (25 mg total) by mouth at bedtime as needed for sleep.   hydrOXYzine  (ATARAX ) 25 MG tablet Take 1 tablet (25 mg total) by mouth 3 (three) times daily as needed for anxiety. (Patient not taking: Reported on 01/22/2024)   gabapentin  (NEURONTIN ) 300 MG capsule Take 300 mg by mouth at bedtime.   lamoTRIgine  (LAMICTAL ) 25 MG tablet Take 50 mg by mouth at bedtime.    QUEtiapine  (SEROQUEL ) 100 MG tablet Take 100 mg by mouth at bedtime.   zaleplon (SONATA) 10 MG capsule Take 10 mg by mouth at bedtime  as needed for sleep.   OVER THE COUNTER MEDICATION Take 1 tablet by mouth daily. Organic Iron Tablet   OVER THE COUNTER MEDICATION Take 1 tablet by mouth 2 (two) times a week. Organic Vitamin D3. Takes on Tuesday and Thursday    Long Term Goals: Improvement in symptoms so as ready for discharge  Short Term Goals: Patient will verbalize feelings in meetings with treatment team members., Patient will attend at least of 50% of the groups daily., Pt will complete the PHQ9 on admission, day 3 and discharge., Patient will participate in completing the [place] Suicide Severity Rating Scale, Patient will score a low risk of violence for 24 hours prior to discharge, and Patient will take medications as prescribed daily.  Medical Decision Making  Admit to [facility] for stabilization and treatment.  Lab Orders         CBC with Differential/Platelet         Comprehensive metabolic panel         POC urine preg, ED         POCT Urine Drug Screen - (I-Screen)      Home Medication continued -Seroquel  100 mg daily at bedtime for insomnia -Zoloft  25 mg PO daily for depressive and anxiety symptoms  Prn meds -Tylenol , Maalox. MOM, Atarax  -Agitation protocol medications   Recommendations  Based on my evaluation the patient does not appear to have an emergency medical condition.  Admit to [facility] for stabilization and treatment.  Thurman LULLA Ivans, NP 01/28/2024  4:20 AM

## 2024-01-28 NOTE — ED Notes (Addendum)
 Pt approached nurses station requesting medication for anxiety. Pt states, I know I shouldn't take it because it's just witchcraft. It's not good for me but give it to me. Pt received Vistaril  and returned to dayroom. Will continue to monitor for safety.

## 2024-01-28 NOTE — Care Plan (Signed)
 Interdisciplinary Treatment and Diagnostic Plan Update  01/29/2024 Time of Session: 10am Casey Lang MRN: [id number]  Diagnosis:  Final diagnoses:  Bipolar 1 disorder, depressed, severe (HCC)  Non compliance w medication regimen  Insomnia due to other mental disorder     Current Medications:  No current facility-administered medications for this encounter.   Current Outpatient Medications  Medication Sig Dispense Refill   QUEtiapine  (SEROQUEL ) 100 MG tablet Take 100 mg by mouth at bedtime.     traZODone  (DESYREL ) 50 MG tablet Take 0.5 tablets (25 mg total) by mouth at bedtime as needed for sleep. 30 tablet 0   sertraline  (ZOLOFT ) 25 MG tablet Take 1 tablet (25 mg total) by mouth daily.     PTA Medications: Prior to Admission medications  Medication Sig Start Date End Date Taking? Authorizing Provider  QUEtiapine  (SEROQUEL ) 100 MG tablet Take 100 mg by mouth at bedtime. 12/09/23  Yes [provider]  traZODone  (DESYREL ) 50 MG tablet Take 0.5 tablets (25 mg total) by mouth at bedtime as needed for sleep. 11/07/23  Yes Bouchard, Marc A, DO  sertraline  (ZOLOFT ) 25 MG tablet Take 1 tablet (25 mg total) by mouth daily. 01/29/24   Tex Drilling, NP    Patient Stressors: Mental Health with taking prescription medications, and conflicting religious beliefs about taking medications.  Patient Strengths: Motivation for treatment/growth   Treatment Modalities: Medication Management, Group therapy, Case management,  1 to 1 session with clinician, Psychoeducation, Recreational therapy.   Physician Treatment Plan for Primary and Secondary Diagnosis:  Final diagnoses:  Bipolar 1 disorder, depressed, severe (HCC)  Non compliance w medication regimen  Insomnia due to other mental disorder   Long Term Goal(s): Improvement in symptoms so as ready for discharge  Short Term Goals: Patient will verbalize feelings in meetings with treatment team members. Patient will attend at  least of 50% of the groups daily. Pt will complete the PHQ9 on admission, day 3 and discharge. Patient will participate in completing the [place] Suicide Severity Rating Scale Patient will score a low risk of violence for 24 hours prior to discharge Patient will take medications as prescribed daily.  Medication Management: Evaluate patient's response, side effects, and tolerance of medication regimen.  Therapeutic Interventions: 1 to 1 sessions, Unit Group sessions and Medication administration.  Evaluation of Outcomes: Not Met  LCSW Treatment Plan for Primary Diagnosis:  Final diagnoses:  Bipolar 1 disorder, depressed, severe (HCC)  Non compliance w medication regimen  Insomnia due to other mental disorder    Long Term Goal(s): Safe transition to appropriate next level of care at discharge.  Short Term Goals: Facilitate acceptance of mental health diagnosis and concerns through verbal commitment to aftercare plan and appointments at discharge. and Increase skills for wellness and recovery by attending 50% of scheduled groups.  Therapeutic Interventions: Assess for all discharge needs, 1 to 1 time with Child psychotherapist, Explore available resources and support systems, Assess for adequacy in community support network, Educate family and significant other(s) on suicide prevention, Complete Psychosocial Assessment, Interpersonal group therapy.  Evaluation of Outcomes: Not Progressing   Progress in Treatment: Attending groups: No. Participating in groups: No. Taking medication as prescribed: Yes. Toleration medication: As evidenced by:  Client reports she wants to feel bad so she will take the medication but she really doesn't want to.  Family/Significant other contact made: No, will contact:  NA Patient understands diagnosis: Yes. Discussing patient identified problems/goals with staff: Yes. Medical problems stabilized or resolved: No. Denies suicidal/homicidal ideation:  No. Issues/concerns per patient self-inventory: Yes. Other: Client reports  she threw away her medicine prior to coming to [facility] but she reports she wants to quiet the noise and the guilt of mental health and taking prescription medications.  Client reports no substance use.   New problem(s) identified: No, Describe:  Client was transferred to [facility] due to a higher level of care needed.   New Short Term/Long Term Goal(s): Take all medications that are prescribed as evidenced by staff and self report to decrease hallucinations to return to a stable level of functioning.  Client reported no SI/HI.   Patient Goals:  Stop feeling guilty and become stable.   Discharge Plan or Barriers: Client was sent to [facility] early this morning due to reported hallucinations, uncontrollable movements, making of loud noises, and hearing Demons.   Reason for Continuation of Hospitalization: Depression Hallucinations Medication stabilization  Estimated Length of Stay:  Last 3 Columbia Suicide Severity Risk Score: Flowsheet Row ED from 01/28/2024 in [facility] Most recent reading at 01/28/2024  5:11 AM ED from 01/28/2024 in [facility] Most recent reading at 01/28/2024  2:37 AM ED from 01/22/2024 in [facility] Most recent reading at 01/22/2024  5:56 AM  C-SSRS RISK CATEGORY No Risk No Risk No Risk    Last PHQ 2/9 Scores:    01/28/2024    3:41 AM 01/22/2024   11:41 AM 07/17/2021   10:30 AM  Depression screen PHQ 2/9  Decreased Interest 2 0 0  Down, Depressed, Hopeless 2 0 0  PHQ - 2 Score 4 0 0  Altered sleeping 3  0  Tired, decreased energy 1  0  Change in appetite 3  0  Feeling bad or failure about yourself  2  0  Trouble concentrating 2  0  Moving slowly or fidgety/restless 0  0  Suicidal thoughts 0  0  PHQ-9 Score 15  0   Difficult doing work/chores Very difficult       Data saved with a  previous flowsheet row definition    Scribe for Treatment Team: Kenzlei Runions, LCSW 01/29/2024 7:57 AM

## 2024-01-28 NOTE — ED Notes (Signed)
 Nurse to nurse report given to Tillman Bunker, RN, @[facility]. Facility reported pt may arrive ASAP

## 2024-01-28 NOTE — ED Notes (Signed)
 Received call from [facility] wanting updates on pt for possible admission to their facility.

## 2024-01-28 NOTE — Group Note (Signed)
 Group Topic: Balance in Life  Group Date: 01/28/2024 Start Time: 1300 End Time: 1345 Facilitators: Alyse Leilani LABOR, NT  Department: [facility]  Number of Participants: 5  Group Focus: abuse issues, acceptance, and activities of daily living skills Treatment Modality:  Skills Training and Solution-Focused Therapy Interventions utilized were group exercise and leisure development Purpose: enhance coping skills and express feelings  Name: Casey Lang Date of Birth: Jan 12, 1981  MR: [id number]   Pt did not come to group. 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

## 2024-01-28 NOTE — Progress Notes (Signed)
 Inpatient Psychiatric Referral  Patient was recommended inpatient per Richerd Friday, NP. There are no available beds at [facility], per [facility] . Patient was referred to the following out of network facilities:  Destination  Service Provider Address Phone Fax  [facility]  [address], [place] [address] [phone]  [facility]  [address] [place], [place] [address] [phone]  [facility]  [address] [place] [address] [phone]  [facility]  [address] [place] [address] [phone]  [facility]  [address] [place] [address] [phone]  [facility]  [address], [place] [address] [phone]  [facility] EFAX  [address] [place], [place] [phone]  [facility]  [address], [place] [address] [phone]    Situation ongoing, CSW to continue following and update chart as more information becomes available.   Harrie Sofia MSW, [place] 01/28/2024

## 2024-01-28 NOTE — Progress Notes (Signed)
 Pt has been accepted to [facility] on 01/28/2024 . Bed assignment:[facility] A   Pt meets inpatient criteria per Richerd Friday, NP   Attending Physician will be Dr. Lois Peeks   Report can be called to: - [phone]   Pt can arrive after ASAP   Care Team Notified: Lajuana Nett, RN, Donia Snell, NP
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
## 2024-01-28 NOTE — ED Notes (Signed)
 Pt did not go to AA group.

## 2024-01-28 NOTE — ED Notes (Signed)
 Pt approached nurses station stating, something is going on in my chest. The demon is trying to come in me and I can't control it. Pt started making jerking movements and sounds. Pt started screaming and jerking down hallway. Pt assisted to her room. Pt laying on bed rolling eyes up in her head and jerking, yelling out loud. Provider on unit. Pt denies SI/HI/AVH. Pt repeats, He's coming. He's coming out of me. Orders given. Pt received Haldol  and Benadryl  stating, medicine is sorcery. It's witchcraft. God told me that. The demon is coming (growling and jerking on bed). Unable to be redirected but took medicine. Pt in room with providers and SW. Will continue to monitor for safety.

## 2024-06-07 ENCOUNTER — Ambulatory Visit
